# Patient Record
Sex: Female | Born: 1965
Health system: Southern US, Community
[De-identification: ages and names within clinical notes are randomized; demographics above are authoritative.]

## PROBLEM LIST (undated history)

## (undated) DIAGNOSIS — T7840XA Allergy, unspecified, initial encounter: Secondary | ICD-10-CM

## (undated) DIAGNOSIS — E782 Mixed hyperlipidemia: Secondary | ICD-10-CM

## (undated) DIAGNOSIS — E039 Hypothyroidism, unspecified: Secondary | ICD-10-CM

## (undated) DIAGNOSIS — F32A Depression, unspecified: Secondary | ICD-10-CM

## (undated) DIAGNOSIS — D649 Anemia, unspecified: Secondary | ICD-10-CM

## (undated) DIAGNOSIS — J45909 Unspecified asthma, uncomplicated: Secondary | ICD-10-CM

## (undated) DIAGNOSIS — E559 Vitamin D deficiency, unspecified: Secondary | ICD-10-CM

## (undated) DIAGNOSIS — F329 Major depressive disorder, single episode, unspecified: Secondary | ICD-10-CM

## (undated) HISTORY — DX: Vitamin D deficiency, unspecified: E55.9

## (undated) HISTORY — DX: Hypothyroidism, unspecified: E03.9

## (undated) HISTORY — DX: Allergy, unspecified, initial encounter: T78.40XA

## (undated) HISTORY — DX: Mixed hyperlipidemia: E78.2

## (undated) HISTORY — DX: Major depressive disorder, single episode, unspecified: F32.9

## (undated) HISTORY — DX: Anemia, unspecified: D64.9

## (undated) HISTORY — DX: Depression, unspecified: F32.A

## (undated) HISTORY — DX: Unspecified asthma, uncomplicated: J45.909

## (undated) HISTORY — PX: CERVICAL POLYPECTOMY: SHX88

---

## 2011-04-09 ENCOUNTER — Ambulatory Visit: Payer: 59

## 2011-04-10 ENCOUNTER — Ambulatory Visit (INDEPENDENT_AMBULATORY_CARE_PROVIDER_SITE_OTHER): Payer: 59 | Admitting: Emergency Medicine

## 2011-04-10 ENCOUNTER — Ambulatory Visit: Payer: 59

## 2011-04-10 VITALS — BP 115/77 | HR 79 | Temp 98.1°F | Resp 16 | Ht 65.75 in | Wt 140.4 lb

## 2011-04-10 DIAGNOSIS — J9801 Acute bronchospasm: Secondary | ICD-10-CM

## 2011-04-10 DIAGNOSIS — E039 Hypothyroidism, unspecified: Secondary | ICD-10-CM

## 2011-04-10 DIAGNOSIS — J40 Bronchitis, not specified as acute or chronic: Secondary | ICD-10-CM

## 2011-04-10 DIAGNOSIS — R059 Cough, unspecified: Secondary | ICD-10-CM

## 2011-04-10 DIAGNOSIS — R05 Cough: Secondary | ICD-10-CM

## 2011-04-10 LAB — POCT CBC
Granulocyte percent: 71.8 %G (ref 37–80)
MID (cbc): 0.6 (ref 0–0.9)
MPV: 9 fL (ref 0–99.8)
POC Granulocyte: 6.9 (ref 2–6.9)
POC LYMPH PERCENT: 21.9 %L (ref 10–50)
POC MID %: 6.3 %M (ref 0–12)
Platelet Count, POC: 302 10*3/uL (ref 142–424)
RBC: 4.4 M/uL (ref 4.04–5.48)
RDW, POC: 13.4 %
WBC: 9.6 10*3/uL (ref 4.6–10.2)

## 2011-04-10 MED ORDER — ALBUTEROL SULFATE HFA 108 (90 BASE) MCG/ACT IN AERS: 2.0000 | INHALATION_SPRAY | RESPIRATORY_TRACT | Status: DC | PRN

## 2011-04-10 MED ORDER — ALBUTEROL SULFATE (2.5 MG/3ML) 0.083% IN NEBU
2.5000 mg | INHALATION_SOLUTION | Freq: Once | RESPIRATORY_TRACT | Status: AC
  Administered 2011-04-10: 2.5 mg via RESPIRATORY_TRACT

## 2011-04-10 MED ORDER — AZITHROMYCIN 250 MG PO TABS: ORAL_TABLET | ORAL | Status: AC

## 2011-04-10 MED ORDER — HYDROCODONE-HOMATROPINE 5-1.5 MG/5ML PO SYRP: ORAL_SOLUTION | ORAL | Status: AC

## 2011-04-10 NOTE — Progress Notes (Signed)
Patient ID: Ed Rayson MRN: [id number], DOB: [birth year], 46 y.o. Date of Encounter: 04/10/2011, 1:54 PM  Primary Physician: No primary provider on file.  Chief Complaint:  Chief Complaint  Patient presents with  . Cough  . Nasal Congestion    HPI: 46 y.o. year old female presents with 7-8 day history of worsening nasal congestion, and cough. Afebrile. No chills. Nasal congestion is thick and clear. Cough is nonproductive, and not associated with time of day. She feels like her nasal congestion worsens at night. She will get into lingering coughing episodes. One of these episodes did cause her to get choked and vomit once. No SOB or wheezing. She does complain of mild sinus pressure coupled with muffled hearing. "It feels like I'm in a balloon." No sore throat or GI complaints. Normal appetite.   No leg trauma, sedentary periods, h/o cancer, or tobacco use.  Past Medical History  Diagnosis Date  . Hypothyroid      Home Meds: Prior to Admission medications   Medication Sig Start Date End Date Taking? Authorizing Provider  levothyroxine (SYNTHROID, LEVOTHROID) 88 MCG tablet Take 88 mcg by mouth daily.   Yes Historical Provider, MD    Allergies:  Allergies  Allergen Reactions  . Cortisone Rash    History   Social History  . Marital Status: Married    Spouse Name: N/A    Number of Children: N/A  . Years of Education: N/A   Occupational History  . Not on file.   Social History Main Topics  . Smoking status: Never Smoker   . Smokeless tobacco: Not on file  . Alcohol Use: Not on file  . Drug Use: Not on file  . Sexually Active: Not on file   Other Topics Concern  . Not on file   Social History Narrative  . No narrative on file     Review of Systems: Constitutional: negative for chills, fever, night sweats or weight changes Cardiovascular: negative for chest pain or palpitations Respiratory: negative for hemoptysis, wheezing, or shortness of  breath Abdominal: negative for abdominal pain, nausea, vomiting or diarrhea Dermatological: negative for rash Neurologic: negative for headache   Physical Exam: Blood pressure 115/77, pulse 79, temperature 98.1 F (36.7 C), temperature source Oral, resp. rate 16, height 5' 5.75" (1.67 m), weight 140 lb 6.4 oz (63.685 kg), last menstrual period 04/03/2011., Body mass index is 22.83 kg/(m^2). General: Well developed, well nourished, in no acute distress. Head: Normocephalic, atraumatic, eyes without discharge, sclera non-icteric, nares are congested. Bilateral auditory canals clear, TM's are without perforation, pearly grey with reflective cone of light bilaterally. Serous effusion bilaterally behind TM's.  No sinus TTP. Oral cavity moist, dentition normal. Posterior pharynx with post nasal drip and mild erythema. No peritonsillar abscess or tonsillar exudate. Neck: Supple. No thyromegaly. Full ROM. No lymphadenopathy. Lungs: Coarse breath sounds with mild expiratory wheezes. No rales or rhonchi. Breathing is unlabored. Heart: RRR with S1 S2. No murmurs, rubs, or gallops appreciated. Abdomen: Soft, non-tender, non-distended with normoactive bowel sounds. No hepatomegaly. No rebound/guarding. No obvious abdominal masses. Msk:  Strength and tone normal for age. Extremities: No clubbing or cyanosis. No edema. Neuro: Alert and oriented X 3. Moves all extremities spontaneously. CNII-XII grossly in tact. Psych:  Responds to questions appropriately with a normal affect.   S/P Albuterol neb: Patient feels much better. No further wheezing on auscultation. Lungs CTAB.  Labs: Results for orders placed in visit on 04/10/11  POCT CBC  Component Value Range   WBC 9.6  4.6 - 10.2 (K/uL)   Lymph, poc 2.1  0.6 - 3.4    POC LYMPH PERCENT 21.9  10 - 50 (%L)   MID (cbc) 0.6  0 - 0.9    POC MID % 6.3  0 - 12 (%M)   POC Granulocyte 6.9  2 - 6.9    Granulocyte percent 71.8  37 - 80 (%G)   RBC 4.40  4.04  - 5.48 (M/uL)   Hemoglobin 13.1  12.2 - 16.2 (g/dL)   HCT, POC 16.1  09.6 - 47.9 (%)   MCV 91.9  80 - 97 (fL)   MCH, POC 29.8  27 - 31.2 (pg)   MCHC 32.4  31.8 - 35.4 (g/dL)   RDW, POC 04.5     Platelet Count, POC 302  142 - 424 (K/uL)   MPV 9.0  0 - 99.8 (fL)   [facility] reading (PRIMARY) by  Dr. Cleta Alberts. No discrete infiltrate. Prominent interstitial markings.    ASSESSMENT AND PLAN:  46 y.o. year old female with bronchitis/bronchospasm/cough -Azithromycin 250 MG #6 2 po first day then 1 po next 4 days no RF -Hycodan #4oz 1 tsp po q 4-6 hours prn cough no RF SED -Proventil #1 2 puffs inhaled q 4-6 hours prn RF 2 -Mucinex -Tylenol/Motrin prn -Rest/fluids -RTC precautions -RTC 3-5 days if no improvement  Signed, Eula Listen, PA-C 04/10/2011 1:54 PM

## 2011-04-24 ENCOUNTER — Ambulatory Visit (INDEPENDENT_AMBULATORY_CARE_PROVIDER_SITE_OTHER): Payer: 59 | Admitting: Family Medicine

## 2011-04-24 VITALS — BP 114/66 | HR 73 | Temp 97.4°F | Resp 16 | Ht 65.0 in | Wt 140.0 lb

## 2011-04-24 DIAGNOSIS — J209 Acute bronchitis, unspecified: Secondary | ICD-10-CM

## 2011-04-24 DIAGNOSIS — J40 Bronchitis, not specified as acute or chronic: Secondary | ICD-10-CM

## 2011-04-24 MED ORDER — METHYLPREDNISOLONE 4 MG PO TABS: 4.0000 mg | ORAL_TABLET | Freq: Two times a day (BID) | ORAL | Status: AC

## 2011-04-24 MED ORDER — AZITHROMYCIN 250 MG PO TABS: ORAL_TABLET | ORAL | Status: DC

## 2011-04-24 NOTE — Patient Instructions (Signed)

## 2011-04-24 NOTE — Progress Notes (Signed)
46 yo woman with severe cough in spasms x 3 weeks. No fever.  Irritated throat.  Minimally productive  O:  HEENT: normal Chest:  Clear  A: pertussis  P:  z pak and medrol

## 2011-04-26 ENCOUNTER — Other Ambulatory Visit: Payer: Self-pay | Admitting: Family Medicine

## 2011-04-26 DIAGNOSIS — R05 Cough: Secondary | ICD-10-CM

## 2011-04-26 DIAGNOSIS — R059 Cough, unspecified: Secondary | ICD-10-CM

## 2011-04-26 MED ORDER — AMOXICILLIN 875 MG PO TABS: 875.0000 mg | ORAL_TABLET | Freq: Two times a day (BID) | ORAL | Status: DC

## 2011-04-28 NOTE — Progress Notes (Signed)
LMOM ADVISING PT THAT RX WAS SENT IN

## 2011-06-24 ENCOUNTER — Other Ambulatory Visit: Payer: Self-pay | Admitting: Family Medicine

## 2011-06-28 ENCOUNTER — Other Ambulatory Visit: Payer: Self-pay | Admitting: Family Medicine

## 2011-06-28 ENCOUNTER — Telehealth: Payer: Self-pay

## 2011-06-28 DIAGNOSIS — N644 Mastodynia: Secondary | ICD-10-CM

## 2011-06-28 DIAGNOSIS — E039 Hypothyroidism, unspecified: Secondary | ICD-10-CM

## 2011-06-28 MED ORDER — LEVOTHYROXINE SODIUM 88 MCG PO TABS: 88.0000 ug | ORAL_TABLET | Freq: Every day | ORAL | Status: DC

## 2011-06-28 NOTE — Telephone Encounter (Signed)
Ok to fill synthroid for one month, but needs an OV for more.

## 2011-06-28 NOTE — Telephone Encounter (Signed)
Left message to call back  

## 2011-06-28 NOTE — Telephone Encounter (Signed)
Pt states pharmacy sent two requests for levothyroxine (SYNTHROID, LEVOTHROID) 88 MCG tablet  medications and we have not responded.

## 2011-06-28 NOTE — Telephone Encounter (Signed)
Please pull paper chart.  

## 2011-06-29 NOTE — Telephone Encounter (Signed)
Spoke with husband, advised to have pt to RTC but refill sent in yesterday. Pt husband understood

## 2011-07-03 ENCOUNTER — Other Ambulatory Visit: Payer: Self-pay | Admitting: Family Medicine

## 2011-07-03 DIAGNOSIS — N63 Unspecified lump in unspecified breast: Secondary | ICD-10-CM

## 2011-07-05 ENCOUNTER — Ambulatory Visit (INDEPENDENT_AMBULATORY_CARE_PROVIDER_SITE_OTHER): Payer: 59 | Admitting: Family Medicine

## 2011-07-05 ENCOUNTER — Encounter: Payer: Self-pay | Admitting: Family Medicine

## 2011-07-05 DIAGNOSIS — R5381 Other malaise: Secondary | ICD-10-CM

## 2011-07-05 DIAGNOSIS — R5383 Other fatigue: Secondary | ICD-10-CM

## 2011-07-05 DIAGNOSIS — E039 Hypothyroidism, unspecified: Secondary | ICD-10-CM

## 2011-07-05 DIAGNOSIS — M940 Chondrocostal junction syndrome [Tietze]: Secondary | ICD-10-CM

## 2011-07-05 LAB — COMPREHENSIVE METABOLIC PANEL
ALT: 10 U/L (ref 0–35)
AST: 16 U/L (ref 0–37)
Albumin: 4.5 g/dL (ref 3.5–5.2)
Alkaline Phosphatase: 69 U/L (ref 39–117)
BUN: 18 mg/dL (ref 6–23)
CO2: 28 mEq/L (ref 19–32)
Calcium: 9.4 mg/dL (ref 8.4–10.5)
Chloride: 107 mEq/L (ref 96–112)
Creat: 1.11 mg/dL — ABNORMAL HIGH (ref 0.50–1.10)
Glucose, Bld: 70 mg/dL (ref 70–99)
Potassium: 4.4 mEq/L (ref 3.5–5.3)
Sodium: 142 mEq/L (ref 135–145)
Total Bilirubin: 0.4 mg/dL (ref 0.3–1.2)
Total Protein: 6.9 g/dL (ref 6.0–8.3)

## 2011-07-05 LAB — CBC
HCT: 38.7 % (ref 36.0–46.0)
Hemoglobin: 13.3 g/dL (ref 12.0–15.0)
MCH: 30.7 pg (ref 26.0–34.0)
MCHC: 34.4 g/dL (ref 30.0–36.0)
MCV: 89.4 fL (ref 78.0–100.0)
Platelets: 299 10*3/uL (ref 150–400)
RBC: 4.33 MIL/uL (ref 3.87–5.11)
RDW: 14.7 % (ref 11.5–15.5)
WBC: 5.4 10*3/uL (ref 4.0–10.5)

## 2011-07-05 LAB — TSH: TSH: 1.196 u[IU]/mL (ref 0.350–4.500)

## 2011-07-05 NOTE — Progress Notes (Signed)
[name] is a 46 year old woman from [place], [place] who comes in with 2 complaints: Fatigue and left chest pain.  The fatigue is been going on for several months, and patient has been on the same dose of thyroid medicine for some time. She needs this checked. She's having headaches fairly regularly as well.  Patient has a family history of breast cancer and has had a biopsy on the left breast. She has not had a mammogram in over 2 years. The pain that she is experiencing left chest is at the border of the breast and she's tried removing this days from her brassiere but does not lessened the pain.  [name] is married to a pianist. Stacey Gardner had a stroke several years ago and is required full-time attention from his wife since. 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 [place] is not drive, and Stacey Gardner can be a handful with his frontal lobe release signs.  I asked if maybe [name] was depressed) started crying and is Stacey Gardner said she was strong but that maybe she was depressed. Stacey Gardner has always been opposed to any kind of psychological help.  Objective: Palpation around the chest is negative, lungs are clear, heart is regular without murmur  Skin is unremarkable  Neck is supple without thyromegaly or adenopathy  Assessment: Active fairly certain that [name] is depressed. Who wouldn't be?  At the same time, she needs to have the chest pain thoroughly evaluated, and we need to make sure that her thyroid levels are normal.  Plan: Check metabolic profile, CBC, TSH. Also if requested a mammogram be done.  If all of these revealed no underlying cause of fatigue or chest pain, I would recommend an SSRI medicine. [facility] refill her thyroid medicine for year once we're sure that the TSH is in line.

## 2011-07-06 ENCOUNTER — Encounter: Payer: Self-pay | Admitting: *Deleted

## 2011-07-24 ENCOUNTER — Other Ambulatory Visit: Payer: Self-pay | Admitting: Family Medicine

## 2011-07-24 DIAGNOSIS — F32A Depression, unspecified: Secondary | ICD-10-CM

## 2011-07-24 DIAGNOSIS — F329 Major depressive disorder, single episode, unspecified: Secondary | ICD-10-CM

## 2011-07-24 MED ORDER — FLUOXETINE HCL 20 MG PO TABS: 20.0000 mg | ORAL_TABLET | Freq: Every day | ORAL | Status: DC

## 2011-08-28 ENCOUNTER — Other Ambulatory Visit: Payer: Self-pay | Admitting: Family Medicine

## 2011-08-31 ENCOUNTER — Other Ambulatory Visit: Payer: Self-pay | Admitting: Physician Assistant

## 2011-09-01 ENCOUNTER — Other Ambulatory Visit: Payer: Self-pay | Admitting: Family Medicine

## 2011-09-22 ENCOUNTER — Other Ambulatory Visit: Payer: Self-pay | Admitting: Family Medicine

## 2011-09-22 DIAGNOSIS — F329 Major depressive disorder, single episode, unspecified: Secondary | ICD-10-CM

## 2011-09-22 DIAGNOSIS — F32A Depression, unspecified: Secondary | ICD-10-CM

## 2011-09-22 MED ORDER — BUPROPION HCL ER (XL) 150 MG PO TB24: 150.0000 mg | ORAL_TABLET | Freq: Every day | ORAL | Status: DC

## 2011-10-10 ENCOUNTER — Other Ambulatory Visit: Payer: Self-pay | Admitting: Family Medicine

## 2011-11-28 ENCOUNTER — Other Ambulatory Visit: Payer: Self-pay | Admitting: Family Medicine

## 2011-11-28 DIAGNOSIS — E039 Hypothyroidism, unspecified: Secondary | ICD-10-CM

## 2011-11-28 DIAGNOSIS — F329 Major depressive disorder, single episode, unspecified: Secondary | ICD-10-CM

## 2011-11-28 DIAGNOSIS — F32A Depression, unspecified: Secondary | ICD-10-CM

## 2011-11-28 MED ORDER — BUPROPION HCL ER (XL) 150 MG PO TB24: 150.0000 mg | ORAL_TABLET | Freq: Every day | ORAL | Status: DC

## 2011-11-28 MED ORDER — LEVOTHYROXINE SODIUM 88 MCG PO TABS: 88.0000 ug | ORAL_TABLET | Freq: Every day | ORAL | Status: DC

## 2012-04-08 DIAGNOSIS — R87610 Atypical squamous cells of undetermined significance on cytologic smear of cervix (ASC-US): Secondary | ICD-10-CM | POA: Insufficient documentation

## 2012-08-27 ENCOUNTER — Encounter: Payer: 59 | Admitting: Family Medicine

## 2012-09-10 ENCOUNTER — Encounter: Payer: Self-pay | Admitting: Family Medicine

## 2012-09-10 ENCOUNTER — Ambulatory Visit (INDEPENDENT_AMBULATORY_CARE_PROVIDER_SITE_OTHER): Payer: BC Managed Care – PPO | Admitting: Family Medicine

## 2012-09-10 VITALS — BP 104/62 | HR 61 | Temp 97.8°F | Resp 16 | Ht 65.75 in | Wt 140.8 lb

## 2012-09-10 DIAGNOSIS — Z124 Encounter for screening for malignant neoplasm of cervix: Secondary | ICD-10-CM

## 2012-09-10 DIAGNOSIS — Z0000 Encounter for general adult medical examination without abnormal findings: Secondary | ICD-10-CM

## 2012-09-10 LAB — POCT UA - MICROSCOPIC ONLY
Casts, Ur, LPF, POC: NEGATIVE
Crystals, Ur, HPF, POC: NEGATIVE
Mucus, UA: NEGATIVE
Yeast, UA: NEGATIVE

## 2012-09-10 LAB — CBC
HCT: 41.1 % (ref 36.0–46.0)
Hemoglobin: 14 g/dL (ref 12.0–15.0)
MCH: 30.6 pg (ref 26.0–34.0)
MCHC: 34.1 g/dL (ref 30.0–36.0)
MCV: 89.9 fL (ref 78.0–100.0)
Platelets: 292 10*3/uL (ref 150–400)
RBC: 4.57 MIL/uL (ref 3.87–5.11)
RDW: 14 % (ref 11.5–15.5)
WBC: 6.2 10*3/uL (ref 4.0–10.5)

## 2012-09-10 LAB — POCT URINALYSIS DIPSTICK
Bilirubin, UA: NEGATIVE
Glucose, UA: NEGATIVE
Ketones, UA: NEGATIVE
Nitrite, UA: NEGATIVE
Protein, UA: NEGATIVE
Spec Grav, UA: 1.01
Urobilinogen, UA: 0.2
pH, UA: 5

## 2012-09-10 NOTE — Progress Notes (Signed)
  Subjective:    Patient ID: Stacey Gardner, female    DOB: [birth year], 47 y.o.   MRN: [id number]  HPI    Review of Systems  Constitutional: Negative.   HENT: Negative.   Eyes: Negative.   Respiratory: Negative.   Cardiovascular: Negative.   Gastrointestinal: Positive for constipation.  Endocrine: Negative.   Genitourinary: Negative.   Musculoskeletal: Negative.   Skin: Negative.   Allergic/Immunologic: Negative.   Neurological: Negative.   Hematological: Negative.   Psychiatric/Behavioral: Negative.        Objective:   Physical Exam        Assessment & Plan:

## 2012-09-10 NOTE — Progress Notes (Signed)
Patient ID: Stacey Gardner MRN: [id number], DOB: 09-Aug-1965, 47 y.o. Date of Encounter: 09/10/2012, 11:33 AM  Primary Physician: No primary provider on file.  Chief Complaint: Physical (CPE)  HPI: 47 y.o. y/o female with history of noted below here for CPE.  Doing well. Notes constipation, cold feet, muscle cramps and bone aches in hips along with sleepiness Last dt 7 years ago LMP:  08-30-2012 Pap:  Over 10 years ago. MMG:  2013 Review of Systems: Consitutional: No fever, chills, fatigue, night sweats, lymphadenopathy, or weight changes. Eyes: No visual changes, eye redness, or discharge. ENT/Mouth: Ears: No otalgia, tinnitus, hearing loss, discharge. Nose: No congestion, rhinorrhea, sinus pain, or epistaxis. Throat: No sore throat, post nasal drip, or teeth pain. Cardiovascular: No CP, palpitations, diaphoresis, DOE, edema, orthopnea, PND. Respiratory: No cough, hemoptysis, SOB, or wheezing. Gastrointestinal: No anorexia, dysphagia, reflux, pain, nausea, vomiting, hematemesis, diarrhea, BRBPR, or melena. Breast: No discharge, pain, swelling, or mass. Genitourinary: No dysuria, frequency, urgency, hematuria, incontinence, nocturia, amenorrhea, vaginal discharge, pruritis, burning, abnormal bleeding, or pain. Musculoskeletal: No decreased ROM, stiffness, joint swelling, or weakness. Skin: No rash, erythema, lesion changes, pain, warmth, jaundice, or pruritis. Neurological: No headache, dizziness, syncope, seizures, tremors, memory loss, coordination problems, or paresthesias. Psychological: No anxiety, depression, hallucinations, SI/HI. Endocrine: No fatigue, polydipsia, polyphagia, polyuria, or known diabetes. All other systems were reviewed and are otherwise negative.  Past Medical History  Diagnosis Date  . Hypothyroid   . Allergy   . Depression   . Anemia      History reviewed. No pertinent past surgical history.  Home Meds:  Prior to Admission medications   Medication  Sig Start Date End Date Taking? Authorizing Provider  levothyroxine (SYNTHROID, LEVOTHROID) 88 MCG tablet Take 1 tablet (88 mcg total) by mouth daily. 11/28/11  Yes Elvina Sidle, MD  minocycline (MINOCIN,DYNACIN) 100 MG capsule Take 100 mg by mouth 2 (two) times daily.   Yes Historical Provider, MD  albuterol (PROVENTIL HFA;VENTOLIN HFA) 108 (90 BASE) MCG/ACT inhaler Inhale 2 puffs into the lungs every 4 (four) hours as needed for wheezing. 04/10/11 04/09/12  Raymon Mutton Dunn, PA-C  buPROPion (WELLBUTRIN XL) 150 MG 24 hr tablet Take 1 tablet (150 mg total) by mouth daily. 11/28/11 11/27/12  Elvina Sidle, MD    Allergies:  Allergies  Allergen Reactions  . Codeine Nausea Only  . Cortisone Rash  . Neosporin (Neomycin-Polymyxin-Gramicidin) Rash    History   Social History  . Marital Status: Married    Spouse Name: N/A    Number of Children: N/A  . Years of Education: N/A   Occupational History  . Not on file.   Social History Main Topics  . Smoking status: Never Smoker   . Smokeless tobacco: Not on file  . Alcohol Use: No  . Drug Use: No  . Sexually Active: Not on file   Other Topics Concern  . Not on file   Social History Narrative   Married. Education: Other    Family History  Problem Relation Age of Onset  . Cancer Mother     breast  . Heart disease Father     Physical Exam: Blood pressure 104/62, pulse 61, temperature 97.8 F (36.6 C), temperature source Oral, resp. rate 16, height 5' 5.75" (1.67 m), weight 140 lb 12.8 oz (63.866 kg), last menstrual period 08/30/2012, SpO2 99.00%., Body mass index is 22.9 kg/(m^2). General: Well developed, well nourished, in no acute distress. HEENT: Normocephalic, atraumatic. Conjunctiva pink, sclera non-icteric. Pupils 2 mm constricting  to 1 mm, round, regular, and equally reactive to light and accomodation. EOMI. Internal auditory canal clear. TMs with good cone of light and without pathology. Nasal mucosa pink. Nares are without  discharge. No sinus tenderness. Oral mucosa pink. Dentition excellent. Pharynx without exudate.   Neck: Supple. Trachea midline. No thyromegaly. Full ROM. No lymphadenopathy. Lungs: Clear to auscultation bilaterally without wheezes, rales, or rhonchi. Breathing is of normal effort and unlabored. Cardiovascular: RRR with S1 S2. No murmurs, rubs, or gallops appreciated. Distal pulses 2+ symmetrically. No carotid or abdominal bruits. Breast: Symmetrical. No masses. Nipples without discharge. Abdomen: Soft, non-tender, non-distended with normoactive bowel sounds. No hepatosplenomegaly or masses. No rebound/guarding. No CVA tenderness. Without hernias.  Genitourinary:  External genitalia without lesions. Vaginal mucosa pink. Cervix pink and without discharge. No cervical or adnexal tenderness. Pap smear taken Musculoskeletal: Full range of motion and 5/5 strength throughout. Without swelling, atrophy, tenderness, crepitus, or warmth. Extremities without clubbing, cyanosis, or edema. Calves supple. Skin: Warm and moist without erythema, ecchymosis, wounds, or rash. Neuro: A+Ox3. CN II-XII grossly intact. Moves all extremities spontaneously. Full sensation throughout. Normal gait. DTR 2+ throughout upper and lower extremities. Finger to nose intact. Psych:  Responds to questions appropriately with a normal affect.   Studies: CBC, CMET, Lipid, TSH pending  Results for orders placed in visit on 09/10/12  POCT UA - MICROSCOPIC ONLY      Result Value Range   WBC, Ur, HPF, POC 0-5     RBC, urine, microscopic 0-6     Bacteria, U Microscopic trace     Mucus, UA neg     Epithelial cells, urine per micros 3-10     Crystals, Ur, HPF, POC neg     Casts, Ur, LPF, POC neg     Yeast, UA neg    POCT URINALYSIS DIPSTICK      Result Value Range   Color, UA yellow     Clarity, UA clear     Glucose, UA neg     Bilirubin, UA neg     Ketones, UA neg     Spec Grav, UA 1.010     Blood, UA trace-intact     pH, UA  5.0     Protein, UA neg     Urobilinogen, UA 0.2     Nitrite, UA neg     Leukocytes, UA Trace       Assessment/Plan:  47 y.o. y/o female here for CPE.  Her symptoms today suggest that she has hypothyroidism Annual physical exam - Plan: CBC, Comprehensive metabolic panel, TSH, POCT UA - Microscopic Only, POCT urinalysis dipstick, Lipid panel, Ambulatory referral to Gastroenterology  Pap smear for cervical cancer screening - Plan: Pap IG (Image Guided)  Signed, Elvina Sidle, MD 09/10/2012 11:33 AM

## 2012-09-11 LAB — COMPREHENSIVE METABOLIC PANEL
ALT: 13 U/L (ref 0–35)
AST: 18 U/L (ref 0–37)
Albumin: 4.7 g/dL (ref 3.5–5.2)
Alkaline Phosphatase: 79 U/L (ref 39–117)
BUN: 13 mg/dL (ref 6–23)
CO2: 28 mEq/L (ref 19–32)
Calcium: 9.7 mg/dL (ref 8.4–10.5)
Chloride: 104 mEq/L (ref 96–112)
Creat: 0.94 mg/dL (ref 0.50–1.10)
Glucose, Bld: 78 mg/dL (ref 70–99)
Potassium: 4.3 mEq/L (ref 3.5–5.3)
Sodium: 139 mEq/L (ref 135–145)
Total Bilirubin: 0.6 mg/dL (ref 0.3–1.2)
Total Protein: 7.3 g/dL (ref 6.0–8.3)

## 2012-09-11 LAB — LIPID PANEL
Cholesterol: 216 mg/dL — ABNORMAL HIGH (ref 0–200)
HDL: 51 mg/dL (ref >39)
LDL Cholesterol: 138 mg/dL — ABNORMAL HIGH (ref 0–99)
Total CHOL/HDL Ratio: 4.2 Ratio
Triglycerides: 137 mg/dL (ref <150)
VLDL: 27 mg/dL (ref 0–40)

## 2012-09-11 LAB — TSH: TSH: 1.62 u[IU]/mL (ref 0.350–4.500)

## 2012-09-14 LAB — PAP IG (IMAGE GUIDED)

## 2012-09-15 ENCOUNTER — Other Ambulatory Visit: Payer: Self-pay | Admitting: Family Medicine

## 2012-09-15 DIAGNOSIS — IMO0002 Reserved for concepts with insufficient information to code with codable children: Secondary | ICD-10-CM

## 2012-10-16 ENCOUNTER — Other Ambulatory Visit: Payer: Self-pay | Admitting: Family Medicine

## 2012-10-16 DIAGNOSIS — R059 Cough, unspecified: Secondary | ICD-10-CM

## 2012-10-16 DIAGNOSIS — R05 Cough: Secondary | ICD-10-CM

## 2012-10-16 MED ORDER — AZITHROMYCIN 250 MG PO TABS: ORAL_TABLET | ORAL | Status: AC

## 2012-11-08 ENCOUNTER — Other Ambulatory Visit: Payer: Self-pay | Admitting: Family Medicine

## 2012-11-08 DIAGNOSIS — R059 Cough, unspecified: Secondary | ICD-10-CM

## 2012-11-08 DIAGNOSIS — J9801 Acute bronchospasm: Secondary | ICD-10-CM

## 2012-11-08 DIAGNOSIS — R05 Cough: Secondary | ICD-10-CM

## 2012-11-08 MED ORDER — ALBUTEROL SULFATE HFA 108 (90 BASE) MCG/ACT IN AERS: 2.0000 | INHALATION_SPRAY | RESPIRATORY_TRACT | Status: DC | PRN

## 2012-11-08 MED ORDER — AMOXICILLIN 875 MG PO TABS: 875.0000 mg | ORAL_TABLET | Freq: Two times a day (BID) | ORAL | Status: AC

## 2012-11-18 LAB — HM COLONOSCOPY

## 2013-05-22 ENCOUNTER — Other Ambulatory Visit: Payer: Self-pay | Admitting: Family Medicine

## 2013-06-22 ENCOUNTER — Other Ambulatory Visit: Payer: Self-pay | Admitting: Family Medicine

## 2013-06-22 DIAGNOSIS — J329 Chronic sinusitis, unspecified: Secondary | ICD-10-CM

## 2013-06-22 MED ORDER — AMOXICILLIN-POT CLAVULANATE 875-125 MG PO TABS: 1.0000 | ORAL_TABLET | Freq: Two times a day (BID) | ORAL | Status: DC

## 2013-06-23 ENCOUNTER — Encounter: Payer: Self-pay | Admitting: Family Medicine

## 2013-06-23 ENCOUNTER — Ambulatory Visit (INDEPENDENT_AMBULATORY_CARE_PROVIDER_SITE_OTHER): Payer: BC Managed Care – PPO | Admitting: Family Medicine

## 2013-06-23 VITALS — BP 140/72 | HR 66 | Temp 97.8°F | Resp 16 | Ht 65.25 in | Wt 143.0 lb

## 2013-06-23 DIAGNOSIS — E039 Hypothyroidism, unspecified: Secondary | ICD-10-CM

## 2013-06-23 DIAGNOSIS — J0100 Acute maxillary sinusitis, unspecified: Secondary | ICD-10-CM

## 2013-06-23 DIAGNOSIS — J309 Allergic rhinitis, unspecified: Secondary | ICD-10-CM

## 2013-06-23 LAB — THYROID PANEL WITH TSH
FREE THYROXINE INDEX: 3.3 (ref 1.0–3.9)
T3 UPTAKE: 33.5 % (ref 22.5–37.0)
T4 TOTAL: 9.9 ug/dL (ref 5.0–12.5)
TSH: 1.382 u[IU]/mL (ref 0.350–4.500)

## 2013-06-23 MED ORDER — CEFUROXIME AXETIL 500 MG PO TABS: 500.0000 mg | ORAL_TABLET | Freq: Two times a day (BID) | ORAL | Status: DC

## 2013-06-23 MED ORDER — DESLORATADINE 5 MG PO TBDP: 5.0000 mg | ORAL_TABLET | Freq: Every day | ORAL | Status: DC

## 2013-06-23 NOTE — Patient Instructions (Signed)
Rinitis alrgica (Allergic Rhinitis) La rinitis alrgica ocurre cuando las membranas mucosas de la nariz responden a los alrgenos. Los alrgenos son las partculas que estn en el aire y que hacen que el cuerpo tenga una reaccin Counselling psychologistalrgica. Esto hace que usted libere anticuerpos alrgicos. A travs de una cadena de eventos, estos finalmente hacen que usted libere histamina en la corriente sangunea. Aunque la funcin de la histamina es proteger al organismo, es esta liberacin de histamina lo que provoca malestar, como los estornudos frecuentes, la congestin y goteo y Control and instrumentation engineerpicazn nasales.  CAUSAS  La causa de la rinitis Merchandiser, retailalrgica estacional (fiebre del heno) son los alrgenos del polen que pueden provenir del csped, los rboles y Theme park managerla maleza. La causa de la rinitis IT consultantalrgica permanente (rinitis alrgica perenne) son los alrgenos como los caros del polvo domstico, la caspa de las mascotas y las esporas del moho.  SNTOMAS   Secrecin nasal (congestin).  Goteo y picazn nasales con estornudos y Arboriculturistlagrimeo. DIAGNSTICO  Su mdico puede ayudarlo a Warehouse managerdeterminar el alrgeno o los alrgenos que desencadenan sus sntomas. Si usted y su mdico no pueden Chief Strategy Officerdeterminar cul es el alrgeno, pueden hacerse anlisis de sangre o estudios de la piel. TRATAMIENTO  La rinitis alrgica no tiene Arubacura, pero puede controlarse mediante lo siguiente:  Medicamentos y vacunas contra la alergia (inmunoterapia).  Prevencin del alrgeno. La fiebre del heno a menudo puede tratarse con antihistamnicos en las formas de pldoras o aerosol nasal. Los antihistamnicos bloquean los efectos de la histamina. Existen medicamentos de venta libre que pueden ayudar con la congestin nasal y la hinchazn alrededor de los ojos. Consulte a su mdico antes de tomar o administrarse este medicamento.  Si la prevencin del alrgeno o el medicamento recetado no dan resultado, existen muchos medicamentos nuevos que su mdico puede recetarle. Pueden  usarse medicamentos ms fuertes si las medidas iniciales no son efectivas. Pueden aplicarse inyecciones desensibilizantes si los medicamentos y la prevencin no funcionan. La desensibilizacin ocurre cuando un paciente recibe vacunas constantes hasta que el cuerpo se vuelve menos sensible al alrgeno. Asegrese de Medical sales representativerealizar un seguimiento con su mdico si los problemas continan. INSTRUCCIONES PARA EL CUIDADO EN EL HOGAR No es posible evitar por completo los alrgenos, pero puede reducir los sntomas al tomar medidas para limitar su exposicin a ellos. Es muy til saber exactamente a qu es alrgico para que pueda evitar sus desencadenantes especficos. SOLICITE ATENCIN MDICA SI:   Lance Mussiene fiebre.  Desarrolla una tos que no se detiene fcilmente (persistente).  Le falta el aire.  Comienza a tener sibilancias.  Los sntomas interfieren con las actividades diarias normales. Document Released: 10/31/2004 Document Revised: 11/11/2012 [facility]Exit[facility] Patient Information 2014 [place]keleyExitCare, MarylandLLC.     Dficit de vitamina D  (Vitamin D Deficiency) La vitamina D es una vitamina importante necesaria para el organismo. Se denomina dficit cuando hay muy poca cantidad en el organismo. Un dficit muy grave puede hacer que los Dumashuesos se ablanden y puede causar una enfermedad llamada raquitismo.  La vitamina D es importante para el organismo por diferentes razones, por ejemplo:   Ayuda en la absorcin de dos minerales llamados calcio y fsforo.  Ayuda a que los McGraw-Hillhuesos estn sanos.  Puede prevenir algunas enfermedades, como la diabetes y la esclerosis mltiple.  Ayuda al funcionamiento de los msculos y Insurance underwriterel corazn. La vitamina D puede obtenerse de Dillard'sdiferentes maneras. Es Neomia Dearuna parte natural de algunos alimentos. La vitamina se agrega tambin a algunos productos lcteos y cereales. Algunas personas toman suplementos de  vitamina D. Adems, el organismo produce vitamina D cuando est al sol. Los rayos del sol hacen  que el organismo produzca una forma de la vitamina que puede Tigardutilizar.  CAUSAS   No comer suficientes alimentos que contengan vitamina D.  No recibir suficiente IT consultantluz solar.  Sufrir Theatre managerciertas enfermedades del sistema digestivo que hacen difcil la absorcin de vitamina D. Estas enfermedades incluyen la enfermedad de Crohn, pancreatitis crnica y la fibrosis Cayman Islandsqustica.  Leanor RubensteinHaber sido sometido a una ciruga en la que se elimina una parte del estmago o el intestino delgado.  Ser obeso. Las clulas de grasa eliminan la vitamina D de la Melmoresangre. Eso hace que las personas obesas no tengan suficiente vitamina D en la sangre y en los tejidos del organismo.  Enfermedades crnicas de los riones o del hgado. FACTORES DE RIESGO  Los factores de riesgo son los que hacen ms probable el desarrollo del dficit de vitamina D. Ellos son:   El envejecimiento.  No poder salir mucho al exterior.  Vivir en un hogar de ancianos.  Fracturas de huesos.  Debilidad de los huesos o huesos delgados (osteoporosis).  Sufrir una enfermedad o trastorno que modifica la forma en que el organismo absorbe la vitamina D.  Tener la piel oscura.  Algunos medicamentos como los medicamentos para las convulsiones o los corticoides.  Tener sobrepeso o ser obeso. SNTOMAS  En los casos leves puede no haber sntomas. En los casos ms graves, los sntomas pueden ser:   Kerr-McGeeDolor en los huesos.  Dolor muscular.  Caerse con frecuencia.  Huesos rotos por lesiones menores, debido a la osteoporosis. DIAGNSTICO  Un anlisis de sangre es la mejor manera de diagnosticar el dficit de vitamina D.  TRATAMIENTO  El dficit de vitamina D puede tratarse de Galesburgdiferentes maneras. El tratamiento depende de la causa. Las opciones incluyen:   Tomar suplementos de vitamina D.  Tomar un suplemento de calcio. Su mdico le sugerir qu dosis es la mejor para usted. INSTRUCCIONES PARA EL CUIDADO EN EL HOGAR   Tome los suplementos que su  mdico le recete. Siga cuidadosamente las indicaciones. Tome slo la cantidad sugerida.  Hgase un anlisis de Grangersangre 2 meses despus de Corporate investment bankerempezar a ConocoPhillipstomar los suplementos.  Consuma alimentos que contengan vitamina D. Algunas buenas opciones son:  Productos lcteos cereales o jugos fortificados, . Fortificado significa que se ha aadido vitamina D a la comida. Revise la etiqueta del paquete para estar seguro.  Pescados grasos como el salmn o la [place] Elm Villagetrucha.  Huevos  Ostras.  No  utilice la cama de solar.  Mantenga su peso en un nivel saludable. Baje de peso si es necesario.  Cumpla con las visitas de control tal como se le indic. El mdico necesitar indicar anlisis de sangre para asegurarse que el dficit de vitamina D est mejorando. SOLICITE ATENCIN MDICA SI:   Tiene preguntas relacionadas con el tratamiento  Contina con los sntomas de dficit de vitamina D  Tiene nuseas o vmitos.  Est constipado.  Se siente confundido.  Siente dolor abdominal o dolor de espalda intenso. ASEGRESE DE QUE:   Comprende estas instrucciones.  Controlar su enfermedad.  Solicitar ayuda de inmediato si no mejora o si empeora. Document Released: 04/15/2011 Document Revised: 05/18/2012 [facility] LPExitCare Patient Information 2014 [place]ExitCare, MarylandLLC.

## 2013-06-24 ENCOUNTER — Other Ambulatory Visit: Payer: Self-pay | Admitting: Family Medicine

## 2013-06-24 DIAGNOSIS — E039 Hypothyroidism, unspecified: Secondary | ICD-10-CM | POA: Insufficient documentation

## 2013-06-24 DIAGNOSIS — J309 Allergic rhinitis, unspecified: Secondary | ICD-10-CM | POA: Insufficient documentation

## 2013-06-24 HISTORY — DX: Allergic rhinitis, unspecified: J30.9

## 2013-06-24 LAB — VITAMIN D 25 HYDROXY (VIT D DEFICIENCY, FRACTURES): VIT D 25 HYDROXY: 16 ng/mL — AB (ref 30–89)

## 2013-06-24 MED ORDER — ERGOCALCIFEROL 1.25 MG (50000 UT) PO CAPS: 50000.0000 [IU] | ORAL_CAPSULE | ORAL | Status: DC

## 2013-06-24 NOTE — Progress Notes (Signed)
Quick Note:  Please advise pt regarding following labs... Thyroid results are normal; you are taking the right amount of medication. I will refill your thyroid medication for 6 months.  Your Vitamin D level is below normal. I will prescribe Vitamin D 1610950000 units per capsule; you need to take this medication ONCE A WEEK through the summer. Refer to the print information you were given. Try to get 10-15 minutes of sun exposure each day.  Copy to pt. ______

## 2013-06-24 NOTE — Progress Notes (Signed)
Subjective:    Patient ID: Stacey Gardner, Stacey Gardner    DOB: 04/15/1965, 48 y.o.   MRN: [id number]  HPI  This 48 y.o. [place] Stacey Gardner is here w/ her husband for evaluation of allergy symptoms w/ congestion, HA/sinus pressure, itchy throat and burning eyes. OTC Claritin has not been effective this year. She does not have fever/chills, sore throat, ear pain, epistaxis or trouble swallowing. The couple lives in a house built in the [address]60s; they do not have the HVAC maintenance routinely. They do not have pets.  Pt has hypothyroidism, last TSH= 1.620 checked in August 2014. She feels like current dose is appropriate. She has good appetite and energy level; she denies CP or palpitations, GI problems, skin or hair changes, HA, dizziness, weakness, confusion or sleep disturbances.  Patient Active Problem List   Diagnosis Date Noted  . Hypothyroid     PMHx, Surg Hx, Soc and Fam HX reviewed.   Review of Systems  Constitutional: Negative.   HENT: Positive for congestion, postnasal drip, rhinorrhea and sneezing.   Eyes: Positive for itching.  Respiratory: Negative.   Cardiovascular: Negative.   Gastrointestinal: Negative.   Endocrine: Negative.   Musculoskeletal: Negative.   Skin: Negative.   Allergic/Immunologic: Positive for environmental allergies.  Neurological: Negative.   Psychiatric/Behavioral: Negative for confusion, sleep disturbance, dysphoric mood, decreased concentration and agitation. The patient is not nervous/anxious.        Objective:   Physical Exam  Nursing note and vitals reviewed. Constitutional: She is oriented to person, place, and time. Vital signs are normal. She appears well-developed and well-nourished. No distress.  HENT:  Head: Normocephalic and atraumatic.  Right Ear: Hearing, tympanic membrane, external ear and ear canal normal.  Left Ear: Hearing, tympanic membrane, external ear and ear canal normal.  Nose: Mucosal edema present. No sinus tenderness, nasal  deformity or septal deviation. Right sinus exhibits maxillary sinus tenderness. Right sinus exhibits no frontal sinus tenderness. Left sinus exhibits maxillary sinus tenderness. Left sinus exhibits no frontal sinus tenderness.  Neck: Trachea normal, normal range of motion and full passive range of motion without pain. Neck supple. No mass and no thyromegaly present.  Cardiovascular: Normal rate, regular rhythm, S1 normal, S2 normal and normal heart sounds.   No extrasystoles are present. PMI is not displaced.  Exam reveals no gallop and no friction rub.   No murmur heard. Pulmonary/Chest: Effort normal and breath sounds normal. No respiratory distress.  Lymphadenopathy:       Head (right side): No submental, no submandibular, no tonsillar, no preauricular, no posterior auricular and no occipital adenopathy present.       Head (left side): No submental, no submandibular, no tonsillar, no preauricular, no posterior auricular and no occipital adenopathy present.    She has no cervical adenopathy.       Right: No supraclavicular adenopathy present.       Left: No supraclavicular adenopathy present.  Neurological: She is alert and oriented to person, place, and time. She has normal strength. No cranial nerve deficit or sensory deficit. Coordination and gait normal.  Reflex Scores:      Tricep reflexes are 2+ on the right side and 2+ on the left side.      Bicep reflexes are 2+ on the right side and 2+ on the left side.      Brachioradialis reflexes are 1+ on the right side and 1+ on the left side.      Patellar reflexes are 2+ on the right side  and 2+ on the left side. Skin: Skin is warm, dry and intact. No ecchymosis, no lesion and no rash noted. She is not diaphoretic. No cyanosis or erythema. No pallor. Nails show no clubbing.  Psychiatric: She has a normal mood and affect. Her behavior is normal. Judgment and thought content normal.      Assessment & Plan:  Sinusitis, acute maxillary- RX:  Cefuroxime 500 mg  1 tablet bid x 7 days.  Allergic rhinitis, cause unspecified- RX: Clarinex generic  5 mg 1 tab daily. Use OTC AYR saline nasal mist.  Unspecified hypothyroidism - Plan: Thyroid Panel With TSH, Vitamin D, 25-hydroxy  Meds ordered this encounter  Medications  . desloratadine (CLARINEX REDITAB) 5 MG disintegrating tablet    Sig: Take 1 tablet (5 mg total) by mouth daily.    Dispense:  30 tablet    Refill:  5  . cefUROXime (CEFTIN) 500 MG tablet    Sig: Take 1 tablet (500 mg total) by mouth 2 (two) times daily with a meal.    Dispense:  14 tablet    Refill:  0

## 2013-06-30 ENCOUNTER — Encounter: Payer: Self-pay | Admitting: Radiology

## 2013-07-04 ENCOUNTER — Other Ambulatory Visit: Payer: Self-pay | Admitting: Family Medicine

## 2013-07-04 NOTE — Telephone Encounter (Signed)
Patient called and did not receive her medication refill for Synthroid. It looks like her TSH was OK and you were going to refill the medication for 6 months.

## 2013-07-04 NOTE — Telephone Encounter (Signed)
Pt states she needs a medication refill on her synthroid. Please advise [phone]

## 2013-07-04 NOTE — Telephone Encounter (Signed)
I have refilled thyroid medication for 3 additional months. Pt will need OV in next 3-4 months.

## 2013-07-05 NOTE — Telephone Encounter (Signed)
LMOM RFs sent and pt needs f/up 3-4 mos.

## 2013-09-15 ENCOUNTER — Other Ambulatory Visit: Payer: Self-pay | Admitting: Family Medicine

## 2013-09-15 DIAGNOSIS — J329 Chronic sinusitis, unspecified: Secondary | ICD-10-CM

## 2013-09-15 MED ORDER — CEFUROXIME AXETIL 500 MG PO TABS: 500.0000 mg | ORAL_TABLET | Freq: Two times a day (BID) | ORAL | Status: DC

## 2013-10-05 ENCOUNTER — Other Ambulatory Visit: Payer: Self-pay | Admitting: Family Medicine

## 2013-10-05 DIAGNOSIS — E039 Hypothyroidism, unspecified: Secondary | ICD-10-CM

## 2013-10-05 MED ORDER — LEVOTHYROXINE SODIUM 88 MCG PO TABS: 88.0000 ug | ORAL_TABLET | Freq: Every day | ORAL | Status: DC

## 2013-12-27 ENCOUNTER — Other Ambulatory Visit: Payer: Self-pay | Admitting: Family Medicine

## 2013-12-27 DIAGNOSIS — J320 Chronic maxillary sinusitis: Secondary | ICD-10-CM

## 2013-12-27 MED ORDER — CEFUROXIME AXETIL 500 MG PO TABS: 500.0000 mg | ORAL_TABLET | Freq: Two times a day (BID) | ORAL | Status: DC

## 2014-01-11 ENCOUNTER — Ambulatory Visit (INDEPENDENT_AMBULATORY_CARE_PROVIDER_SITE_OTHER): Payer: BC Managed Care – PPO | Admitting: Emergency Medicine

## 2014-01-11 VITALS — BP 104/78 | HR 77 | Temp 98.1°F | Resp 12 | Ht 65.0 in | Wt 143.0 lb

## 2014-01-11 DIAGNOSIS — J0140 Acute pansinusitis, unspecified: Secondary | ICD-10-CM

## 2014-01-11 DIAGNOSIS — J209 Acute bronchitis, unspecified: Secondary | ICD-10-CM

## 2014-01-11 MED ORDER — LEVOFLOXACIN 500 MG PO TABS: 500.0000 mg | ORAL_TABLET | Freq: Every day | ORAL | Status: AC

## 2014-01-11 MED ORDER — HYDROCOD POLST-CHLORPHEN POLST 10-8 MG/5ML PO LQCR: 5.0000 mL | Freq: Two times a day (BID) | ORAL | Status: DC | PRN

## 2014-01-11 MED ORDER — PSEUDOEPHEDRINE-GUAIFENESIN ER 60-600 MG PO TB12: 1.0000 | ORAL_TABLET | Freq: Two times a day (BID) | ORAL | Status: DC

## 2014-01-11 NOTE — Patient Instructions (Signed)
Inhalador con medidor de dosis (sin Geographical information systems officer) (Metered Dose Inhaler [No Spacer Used]) Los medicamentos que se inhalan son la base del tratamiento del asma y otros problemas respiratorios. Los medicamentos inhalados son eficaces cuando se utilizan adecuadamente. Una buena tcnica asegura que el medicamento llegue a los pulmones. Los inhaladores con medidor de dosis se [place] para Architectural technologist varios tipos de medicamentos inhalados. Entre ellos se incluyen los medicamentos de alivio rpido o medicamentos de rescate (como los broncodilatadores) y los medicamentos de control (como los corticoides). El medicamento se administra presionando el contenedor metlico para Financial controller una cantidad de aerosol. Si [place] diferentes tipos de inhaladores, use el medicamento de alivio rpido para abrir las vas respiratorias 10 a 27mnutos antes de usar un corticoide, si el mdico se lo indic. Si no est seguro de qTeachers Insurance and Annuity Associationusar y en qu orden usarlos, consulte a su mdico, enfermera o terapeuta especialista en vas respiratorias. CMO USAR EL INHALADOR 1. Retire la tapa del inhalador. 2. Si uCanadael inhalador por primera vez, ser necesario que lo prepare. Sacuda el inhalador durante 5 segundos y libere cuatro descargas en el aire, lejos del r[place] Consulte a su mdico o farmacutico si tiene preguntas acerca de la preparacin del inhalador. 3. Sacuda el inhalador durante 5 segundos antes de cada respiracin (inhalacin). 4. Coloque el inhalador de mLowe's Companiesparte superior del envase quede hLatvia 5. Coloque el dedo ndice en la parte superior del envase del medicamento. El pulgar sostiene la parte inferior del i[place] 6. A[place] 7. Coloque el inhalador eTXU Corpdientes y apriete los labios fuertemente alrededor de la boquilla, o sostenga el inhalador a unas distancia de 1 a 2pulgadas (2,5 a 5cm) de la boca abierta. Si no est seguro de qCopywriter, advertising consulte a su mdico. 8. Espire (exhale)  normalmente y lo ms profundamente posible. 9. Presione el envase hacia abajo con el dedo ndice para lProduct/process development scientist 10. Al mismo tiempo que presiona el envase, inhale profunda y lentamente hasta que los pulmones se llenen completamente. Esto debe llevarle de 4 a 6segundos. Mantenga la lengua hacia abajo. 1[place] des Mortsmedicacin en los pulmones durante 5 a 10segundos (10 segundos es lo ms indicado). Esto ayuda a que el mMattelvas areas pequeas de los pulmones. 12. Exhale lentamente a travs de los labios fruncidos. Ponga los labios como cuando silba. 13. Espere al menos 183muto entre una descarga y otCosta RicaContine con los pasos indicados anteriormente hasta que haya tomado el nmero de descargas que el mdico le indic. No use el inhalador ms de lo que su mdico le indique. 14. Vuelva a colocar la tapa en el inhalador. 15[place] su mdico o las instrucciones que vienen para liAir cabin crewSi utiliza unEducational psychologiston corticoides, enjuguese la boca con agua despus de la ltima descarga, hgase grgaras y escupa el agua. No trague el agua. EVITE LO SIGUIENTE:  Inhalar antes o despus de comenzar el aerosol del medicamento. Es necesario tener prctica para coordinar la respiracin con la emisin del aerosol.  Al emitir el aerosol, inhale por la naLawyeren lugar de hacerlo por la boca). CMO SABER SI EL INHALADOR EST LLENO O CASI VACO No puede saber cundo un inhalador est vaco al sacudirlo. Algunos inhaladores ahora vienen con contador de dosis. Pdale a su mdico que le recete el que tiene el contador de dosis si siente que necesita ayuda extra. Si el inhalador no tiene coScientist, physiological  pdale a su mdico que lo ayude a Soil scientist a llenarlo. Anote la fecha en que deber rellenarlo en un almanaque o en el envase del inhalador. Rellene el inhalador de 7 a 10das antes de que se termine. Asegrese de Cisco  un suministro adecuado del medicamento. Esto incluye asegurarse de que no est vencido y de que tiene un inhalador de repuesto.  SOLICITE ATENCIN MDICA SI:   Los sntomas solo se Engineer, mining con Forensic psychologist.  Tiene dificultad para Water quality scientist.  Aumenta la produccin de flema. SOLICITE ATENCIN MDICA DE INMEDIATO SI:   Siente poco alivio con el uso de [place], o ni tiene [place]. Contina con sibilancias y siente que le falta el aire, tiene opresin en el [place], o ambos.  Siente mareos, dolor de cabeza o una frecuencia cardaca acelerada.  Siente escalofros, fiebre o sudores nocturnos.  Nota aumento en la produccin de flema o hay sangre en la flema. ASEGRESE DE QUE:  Comprende estas instrucciones.  Controlar su afeccin.  Recibir ayuda de inmediato si no mejora o si empeora. Document Released: 05/09/2008 Document Revised: 06/07/2013 [facility] Patient Information 2015 [place], [place]. This information is not intended to replace advice given to you by your health care provider. Make sure you discuss any questions you have with your health care provider.

## 2014-01-11 NOTE — Progress Notes (Signed)
[facility] [address], [place] [id number] [phone]  Date:  01/11/2014   Name:  Stacey BeaversMercedes Gardner   DOB:  01/15/1966   MRN:  [id number]  PCP:  No PCP Per Patient    Chief Complaint: Cough and Nasal Congestion   History of Present Illness:  Stacey Gardner is a 48 y.o. very pleasant female patient who presents with the following:  Ill and called Dr L who prescribed ceftin over the phone. Now has recurrent nasal congestion and post nasal drainage that is mucopurulent. Has a cough productive of purulent sputum with no wheezing or shortness of breath.   No nausea but has some post-tussive vomiting No stool change or rash No improvement with over the counter medications or other home remedies.  Denies other complaint or health concern today.    Patient Active Problem List   Diagnosis Date Noted  . Allergic rhinitis, cause unspecified 06/24/2013  . Unspecified hypothyroidism 06/24/2013    Past Medical History  Diagnosis Date  . Hypothyroid   . Allergy   . Depression   . Anemia     History reviewed. No pertinent past surgical history.  History  Substance Use Topics  . Smoking status: Never Smoker   . Smokeless tobacco: Never Used  . Alcohol Use: No    Family History  Problem Relation Age of Onset  . Cancer Mother     breast  . Heart disease Father     Allergies  Allergen Reactions  . Codeine Nausea Only  . Cortisone Rash  . Neosporin [Neomycin-Polymyxin-Gramicidin] Rash    Medication list has been reviewed and updated.  Current Outpatient Prescriptions on File Prior to Visit  Medication Sig Dispense Refill  . albuterol (PROVENTIL HFA;VENTOLIN HFA) 108 (90 BASE) MCG/ACT inhaler Inhale 2 puffs into the lungs every 4 (four) hours as needed for wheezing. 1 Inhaler 2  . desloratadine (CLARINEX REDITAB) 5 MG disintegrating tablet Take 1 tablet (5 mg total) by mouth daily. 30 tablet 5  . levothyroxine (SYNTHROID, LEVOTHROID) 88  MCG tablet Take 1 tablet (88 mcg total) by mouth daily before breakfast. 90 tablet 2   No current facility-administered medications on file prior to visit.    Review of Systems: As per HPI, otherwise negative.    Physical Examination: Filed Vitals:   01/11/14 1807  BP: 104/78  Pulse: 77  Temp: 98.1 F (36.7 C)  Resp: 12   Filed Vitals:   01/11/14 1807  Height: 5\' 5"  (1.651 m)  Weight: 143 lb (64.864 kg)   Body mass index is 23.8 kg/(m^2). Ideal Body Weight: Weight in (lb) to have BMI = 25: 149.9  GEN: WDWN, NAD, Non-toxic, A & O x 3 HEENT: Atraumatic, Normocephalic. Neck supple. No masses, No LAD. Ears and Nose: No external deformity. CV: RRR, No M/G/R. No JVD. No thrill. No extra heart sounds. PULM: CTA B, no wheezes, crackles, rhonchi. No retractions. No resp. distress. No accessory muscle use. ABD: S, NT, ND, +BS. No rebound. No HSM. EXTR: No c/c/e NEURO Normal gait.  PSYCH: Normally interactive. Conversant. Not depressed or anxious appearing.  Calm demeanor.    Assessment and Plan: Bronchitis Sinusitis levaquin mucinex tussionex   Signed,  Phillips OdorJeffery Wladyslawa Disbro, MD

## 2014-04-12 ENCOUNTER — Ambulatory Visit: Payer: Self-pay | Admitting: Family Medicine

## 2014-04-21 ENCOUNTER — Ambulatory Visit (INDEPENDENT_AMBULATORY_CARE_PROVIDER_SITE_OTHER): Payer: 59 | Admitting: Family Medicine

## 2014-04-21 ENCOUNTER — Encounter: Payer: Self-pay | Admitting: Family Medicine

## 2014-04-21 VITALS — BP 108/68 | HR 63 | Temp 97.7°F | Resp 16 | Ht 66.5 in | Wt 142.0 lb

## 2014-04-21 DIAGNOSIS — N644 Mastodynia: Secondary | ICD-10-CM

## 2014-04-21 DIAGNOSIS — Z0000 Encounter for general adult medical examination without abnormal findings: Secondary | ICD-10-CM

## 2014-04-21 DIAGNOSIS — E559 Vitamin D deficiency, unspecified: Secondary | ICD-10-CM | POA: Diagnosis not present

## 2014-04-21 DIAGNOSIS — M25579 Pain in unspecified ankle and joints of unspecified foot: Secondary | ICD-10-CM | POA: Diagnosis not present

## 2014-04-21 DIAGNOSIS — G44329 Chronic post-traumatic headache, not intractable: Secondary | ICD-10-CM | POA: Diagnosis not present

## 2014-04-21 DIAGNOSIS — E039 Hypothyroidism, unspecified: Secondary | ICD-10-CM

## 2014-04-21 LAB — POCT URINALYSIS DIPSTICK
Bilirubin, UA: NEGATIVE
Glucose, UA: NEGATIVE
Ketones, UA: NEGATIVE
Nitrite, UA: NEGATIVE
Protein, UA: 30
Spec Grav, UA: 1.015
Urobilinogen, UA: 0.2
pH, UA: 5.5

## 2014-04-21 LAB — COMPREHENSIVE METABOLIC PANEL
ALT: 13 U/L (ref 0–35)
AST: 20 U/L (ref 0–37)
Albumin: 4.5 g/dL (ref 3.5–5.2)
Alkaline Phosphatase: 89 U/L (ref 39–117)
BUN: 17 mg/dL (ref 6–23)
CO2: 21 mEq/L (ref 19–32)
Calcium: 9.5 mg/dL (ref 8.4–10.5)
Chloride: 104 mEq/L (ref 96–112)
Creat: 0.84 mg/dL (ref 0.50–1.10)
Glucose, Bld: 76 mg/dL (ref 70–99)
Potassium: 4.1 mEq/L (ref 3.5–5.3)
Sodium: 139 mEq/L (ref 135–145)
Total Bilirubin: 0.5 mg/dL (ref 0.2–1.2)
Total Protein: 7.2 g/dL (ref 6.0–8.3)

## 2014-04-21 LAB — TSH: TSH: 2.602 u[IU]/mL (ref 0.350–4.500)

## 2014-04-21 LAB — CBC
HCT: 39.5 % (ref 36.0–46.0)
Hemoglobin: 13.8 g/dL (ref 12.0–15.0)
MCH: 31.1 pg (ref 26.0–34.0)
MCHC: 34.9 g/dL (ref 30.0–36.0)
MCV: 89 fL (ref 78.0–100.0)
MPV: 10.8 fL (ref 8.6–12.4)
Platelets: 284 10*3/uL (ref 150–400)
RBC: 4.44 MIL/uL (ref 3.87–5.11)
RDW: 13.7 % (ref 11.5–15.5)
WBC: 6.2 10*3/uL (ref 4.0–10.5)

## 2014-04-21 LAB — POCT UA - MICROSCOPIC ONLY
Bacteria, U Microscopic: NEGATIVE
Casts, Ur, LPF, POC: NEGATIVE
Crystals, Ur, HPF, POC: NEGATIVE
Mucus, UA: NEGATIVE
Yeast, UA: NEGATIVE

## 2014-04-21 LAB — LIPID PANEL
Cholesterol: 212 mg/dL — ABNORMAL HIGH (ref 0–200)
HDL: 47 mg/dL (ref >=46)
LDL Cholesterol: 135 mg/dL — ABNORMAL HIGH (ref 0–99)
Total CHOL/HDL Ratio: 4.5 Ratio
Triglycerides: 150 mg/dL — ABNORMAL HIGH (ref <150)
VLDL: 30 mg/dL (ref 0–40)

## 2014-04-21 LAB — POCT SEDIMENTATION RATE: POCT SED RATE: 22 mm/hr (ref 0–22)

## 2014-04-21 NOTE — Patient Instructions (Signed)
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
## 2014-04-22 ENCOUNTER — Other Ambulatory Visit: Payer: Self-pay | Admitting: Family Medicine

## 2014-04-22 DIAGNOSIS — E039 Hypothyroidism, unspecified: Secondary | ICD-10-CM

## 2014-04-22 LAB — VITAMIN D 25 HYDROXY (VIT D DEFICIENCY, FRACTURES): Vit D, 25-Hydroxy: 22 ng/mL — ABNORMAL LOW (ref 30–100)

## 2014-04-22 MED ORDER — LEVOTHYROXINE SODIUM 88 MCG PO TABS: 88.0000 ug | ORAL_TABLET | Freq: Every day | ORAL | Status: DC

## 2014-05-04 ENCOUNTER — Other Ambulatory Visit: Payer: Self-pay

## 2014-05-16 ENCOUNTER — Ambulatory Visit (INDEPENDENT_AMBULATORY_CARE_PROVIDER_SITE_OTHER): Payer: 59 | Admitting: Sports Medicine

## 2014-05-16 ENCOUNTER — Encounter: Payer: Self-pay | Admitting: Sports Medicine

## 2014-05-16 VITALS — BP 117/78 | Ht 70.0 in | Wt 138.9 lb

## 2014-05-16 DIAGNOSIS — M79672 Pain in left foot: Secondary | ICD-10-CM | POA: Diagnosis not present

## 2014-05-16 DIAGNOSIS — M216X9 Other acquired deformities of unspecified foot: Secondary | ICD-10-CM

## 2014-05-16 DIAGNOSIS — M79671 Pain in right foot: Secondary | ICD-10-CM

## 2014-05-16 NOTE — Progress Notes (Signed)
   Subjective:    Patient ID: Stacey BeaversMercedes Gardner, female    DOB: 09/22/1965, 49 y.o.   MRN: [id number]  HPI chief complaint: Bilateral feet pain  Very pleasant 49 year old female comes in today complaining of chronic bilateral foot pain. She has had pain for many years. She is from [place] and states that she had orthotics made for her in [place] but that was many years ago. She has not used any sort of insert recently. Her pain is diffuse from the midfoot down into the forefoot. She denies any pain in her heel. She denies any soft tissue swelling. Symptoms are worse with weightbearing and walking. Improve at rest. She also has noticed some prominence at the base of her fifth metatarsal bilaterally. She is here today with her husband. Past medical history reviewed Medications reviewed Allergies reviewed    Review of Systems     Objective:   Physical Exam Well-developed, well-nourished. No acute distress. Awake alert and oriented 3. Vital signs reviewed.  Examination of her feet in the standing position shows a cavus foot bilaterally. She has transverse arch collapse bilaterally as well as prominence at the base of the fifth metatarsals bilaterally consistent with either with a large fifth metatarsal base or possibly an os perineum. No soft tissue swelling. There is no tenderness to palpation throughout the foot. Neurovascularly intact distally. She has a rather neutral gait with walking.       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 We will try scaphoid pads for arch support and metatarsal pads for transverse arch support. Patient will follow-up with me when she returns from an upcoming trip to [place]. If she finds the temporary inserts to be comfortable, we will plan on making her custom orthotics at her follow-up visit. She will notify us of any questions or concerns in the interim.

## 2014-06-20 ENCOUNTER — Encounter: Payer: Self-pay | Admitting: *Deleted

## 2014-06-20 NOTE — Consult Note (Signed)
Exam:  CT Head or Brain without contrast  Reason for visit:  Migraine HA  Findings:  No intracranial hemorrhage, mass or hydrocephalus.  No areas of abnormal attenuation are identified within the brain.  No extra-axial lesions.  Bone window images are unremarkable.  Impression:  Normal study  Evelina Bucyennis Clemens, MD 05/23/2014 at [phone]

## 2014-06-21 ENCOUNTER — Encounter: Payer: 59 | Admitting: Sports Medicine

## 2014-06-27 ENCOUNTER — Encounter: Payer: 59 | Admitting: Sports Medicine

## 2014-06-30 ENCOUNTER — Other Ambulatory Visit: Payer: Self-pay | Admitting: Family Medicine

## 2014-07-14 ENCOUNTER — Encounter: Payer: Self-pay | Admitting: Family Medicine

## 2014-07-19 ENCOUNTER — Encounter: Payer: 59 | Admitting: Sports Medicine

## 2014-07-27 ENCOUNTER — Other Ambulatory Visit: Payer: Self-pay | Admitting: Family Medicine

## 2014-07-27 DIAGNOSIS — L700 Acne vulgaris: Secondary | ICD-10-CM

## 2014-07-27 MED ORDER — MINOCYCLINE HCL 50 MG PO CAPS: 50.0000 mg | ORAL_CAPSULE | Freq: Two times a day (BID) | ORAL | Status: DC

## 2014-08-26 ENCOUNTER — Other Ambulatory Visit: Payer: Self-pay | Admitting: Family Medicine

## 2014-08-26 DIAGNOSIS — Z9109 Other allergy status, other than to drugs and biological substances: Secondary | ICD-10-CM

## 2014-08-26 MED ORDER — MONTELUKAST SODIUM 10 MG PO TABS: 10.0000 mg | ORAL_TABLET | Freq: Every day | ORAL | Status: DC

## 2014-10-13 ENCOUNTER — Other Ambulatory Visit: Payer: Self-pay

## 2014-10-13 DIAGNOSIS — J9801 Acute bronchospasm: Secondary | ICD-10-CM

## 2014-10-13 NOTE — Telephone Encounter (Signed)
Dr L, you saw pt back in March for CPE, but I don't see albuterol discussed recently. Do you want to give RFs or have pt RTC for eval?

## 2014-10-14 MED ORDER — ALBUTEROL SULFATE HFA 108 (90 BASE) MCG/ACT IN AERS: 2.0000 | INHALATION_SPRAY | RESPIRATORY_TRACT | Status: DC | PRN

## 2015-01-02 ENCOUNTER — Encounter: Payer: Self-pay | Admitting: Family Medicine

## 2015-01-02 ENCOUNTER — Ambulatory Visit (INDEPENDENT_AMBULATORY_CARE_PROVIDER_SITE_OTHER): Payer: 59 | Admitting: Family Medicine

## 2015-01-02 VITALS — BP 110/71 | HR 70 | Temp 97.6°F | Resp 16 | Ht 70.0 in | Wt 147.0 lb

## 2015-01-02 DIAGNOSIS — J309 Allergic rhinitis, unspecified: Secondary | ICD-10-CM | POA: Diagnosis not present

## 2015-01-02 DIAGNOSIS — M2550 Pain in unspecified joint: Secondary | ICD-10-CM | POA: Diagnosis not present

## 2015-01-02 DIAGNOSIS — L270 Generalized skin eruption due to drugs and medicaments taken internally: Secondary | ICD-10-CM

## 2015-01-02 LAB — RHEUMATOID FACTOR: Rhuematoid fact SerPl-aCnc: <10 IU/mL (ref <=14)

## 2015-01-02 MED ORDER — FLUTICASONE PROPIONATE 50 MCG/ACT NA SUSP: 2.0000 | Freq: Every day | NASAL | Status: DC

## 2015-01-02 NOTE — Patient Instructions (Signed)
For nasal congestion/rhinitis  Please use Afrin nasal spray twice a day for 3 days. For those three days, use flonase nasal spray after you use the afrin then use the flonase once daily.  Can try over the counter sudafed- one in the morning and one after lunch as needed for congestion, facial and ear pressure Try saline nasal spray for comfort and to rinse allergens from your nasal passages. The brand Jannetta Quintyr is a little thicker and helps with moisture.  Please come back in if you develop fever, recurrence of rash or worsening congestion/pain not helped by above measures.   Allergic Rhinitis Allergic rhinitis is when the mucous membranes in the nose respond to allergens. Allergens are particles in the air that cause your body to have an allergic reaction. This causes you to release allergic antibodies. Through a chain of events, these eventually cause you to release histamine into the blood stream. Although meant to protect the body, it is this release of histamine that causes your discomfort, such as frequent sneezing, congestion, and an itchy, runny nose.  CAUSES Seasonal allergic rhinitis (hay fever) is caused by pollen allergens that may come from grasses, trees, and weeds. Year-round allergic rhinitis (perennial allergic rhinitis) is caused by allergens such as house dust mites, pet dander, and mold spores. SYMPTOMS  Nasal stuffiness (congestion).  Itchy, runny nose with sneezing and tearing of the eyes. DIAGNOSIS Your health care provider can help you determine the allergen or allergens that trigger your symptoms. If you and your health care provider are unable to determine the allergen, skin or blood testing may be used. Your health care provider will diagnose your condition after taking your health history and performing a physical exam. Your health care provider may assess you for other related conditions, such as asthma, pink eye, or an ear infection. TREATMENT Allergic rhinitis does not  have a cure, but it can be controlled by:  Medicines that block allergy symptoms. These may include allergy shots, nasal sprays, and oral antihistamines.  Avoiding the allergen. Hay fever may often be treated with antihistamines in pill or nasal spray forms. Antihistamines block the effects of histamine. There are over-the-counter medicines that may help with nasal congestion and swelling around the eyes. Check with your health care provider before taking or giving this medicine. If avoiding the allergen or the medicine prescribed do not work, there are many new medicines your health care provider can prescribe. Stronger medicine may be used if initial measures are ineffective. Desensitizing injections can be used if medicine and avoidance does not work. Desensitization is when a patient is given ongoing shots until the body becomes less sensitive to the allergen. Make sure you follow up with your health care provider if problems continue. HOME CARE INSTRUCTIONS It is not possible to completely avoid allergens, but you can reduce your symptoms by taking steps to limit your exposure to them. It helps to know exactly what you are allergic to so that you can avoid your specific triggers. SEEK MEDICAL CARE IF:  You have a fever.  You develop a cough that does not stop easily (persistent).  You have shortness of breath.  You start wheezing.  Symptoms interfere with normal daily activities.   This information is not intended to replace advice given to you by your health care provider. Make sure you discuss any questions you have with your health care provider.   Document Released: 10/16/2000 Document Revised: 02/11/2014 Document Reviewed: 09/28/2012 Elsevier Interactive Patient Education 2016 Elsevier  Inc.  

## 2015-01-02 NOTE — Progress Notes (Signed)
Subjective:    Patient ID: Stacey Gardner, female    DOB: 09/25/1965, [age] y.o.   MRN: [id number]  HPI This is a pleasant 49 yo patient who presents today with complaint of a rash she had last week and joint pain. She was in [place] for her father's 80th birthday and had some nasal congestion. She took some of her family's cough medicine. She brings the box int with her today, it seems to be a mucolytic and dextromethorphan. About a day after taking the cough medicine, she noticed a rash and facial swelling (right eye) that lasted for 2-3 days. She did not have any oral swelling, SOB or chest tightness. She started zyrtec and benadryl lotion with eventual relief of rash. Currently, she is having joint aches in her hands and feet that started with the rash. She thinks her knuckles are mildly swollen. She has not had pain or swelling like this before. She has photos of the rash that show a wide spread, erythematous macular rash.   Past Medical History  Diagnosis Date  . Hypothyroid   . Allergy   . Depression   . Anemia   No past surgical history on file. Family History  Problem Relation Age of Onset  . Cancer Mother     breast  . Heart disease Father    Social History  Substance Use Topics  . Smoking status: Never Smoker   . Smokeless tobacco: Never Used  . Alcohol Use: No   Review of Systems  Constitutional: Negative for fever, chills and fatigue.  HENT: Positive for facial swelling (last week, with rash), rhinorrhea and sinus pressure.   Respiratory: Negative for cough, chest tightness, shortness of breath, wheezing and stridor.   Cardiovascular: Negative for chest pain and leg swelling.  Allergic/Immunologic: Positive for environmental allergies.      Objective:   Physical Exam  Constitutional: She is oriented to person, place, and time. She appears well-developed and well-nourished. No distress.  HENT:  Head: Normocephalic and atraumatic.  Right Ear: External ear and  ear canal normal. Tympanic membrane is retracted.  Left Ear: External ear and ear canal normal. Tympanic membrane is retracted.  Nose: Mucosal edema and rhinorrhea present. Right sinus exhibits no maxillary sinus tenderness and no frontal sinus tenderness. Left sinus exhibits no maxillary sinus tenderness and no frontal sinus tenderness.  Mouth/Throat: Uvula is midline and mucous membranes are normal. Posterior oropharyngeal erythema present. No oropharyngeal exudate or posterior oropharyngeal edema.  Cardiovascular: Normal rate, regular rhythm and normal heart sounds.   Pulmonary/Chest: Effort normal and breath sounds normal.  Musculoskeletal: Normal range of motion.  I do not appreciate any joint swelling or effusion of hand joints. No redness, no TTP. Full ROM. Circulation intact.   Neurological: She is alert and oriented to person, place, and time.  Skin: Skin is warm and dry. No rash noted. She is not diaphoretic.  Psychiatric: She has a normal mood and affect. Her behavior is normal. Judgment and thought content normal.  Vitals reviewed. BP 110/71 mmHg  Pulse 70  Temp(Src) 97.6 F (36.4 C)  Resp 16  Ht 5\' 10"  (1.778 m)  Wt 147 lb (66.679 kg)  BMI 21.09 kg/m2     Assessment & Plan:  1. Rash, drug contact - Sedimentation Rate - Rheumatoid factor - ANA  2. Joint pain - Sedimentation Rate - Rheumatoid factor - ANA  3. Allergic rhinitis, unspecified allergic rhinitis type -Provided written and verbal information regarding diagnosis and treatment, including OTC  symptomatic relief measures - continue Zyrtec - fluticasone (FLONASE) 50 MCG/ACT nasal spray; Place 2 sprays into both nostrils daily.  Dispense: 16 g; Refill: 6  - RTC precautions given  Olean Ree, FNP-BC  [facility]  01/04/2015 8:33 AM

## 2015-01-03 LAB — SEDIMENTATION RATE: Sed Rate: 11 mm/hr (ref 0–20)

## 2015-01-04 LAB — ANA: Anti Nuclear Antibody(ANA): NEGATIVE

## 2015-01-05 ENCOUNTER — Encounter: Payer: Self-pay | Admitting: *Deleted

## 2015-01-17 ENCOUNTER — Other Ambulatory Visit: Payer: Self-pay | Admitting: Family Medicine

## 2015-01-17 DIAGNOSIS — J0101 Acute recurrent maxillary sinusitis: Secondary | ICD-10-CM

## 2015-01-17 MED ORDER — AMOXICILLIN 500 MG PO CAPS: 500.0000 mg | ORAL_CAPSULE | Freq: Three times a day (TID) | ORAL | Status: DC

## 2015-05-05 ENCOUNTER — Other Ambulatory Visit: Payer: Self-pay | Admitting: Family Medicine

## 2015-05-25 ENCOUNTER — Other Ambulatory Visit: Payer: Self-pay | Admitting: Family Medicine

## 2015-05-25 MED ORDER — LEVOTHYROXINE SODIUM 88 MCG PO TABS: ORAL_TABLET | ORAL | Status: DC

## 2015-07-01 ENCOUNTER — Other Ambulatory Visit: Payer: Self-pay | Admitting: Family Medicine

## 2015-07-01 MED ORDER — ALBUTEROL SULFATE (2.5 MG/3ML) 0.083% IN NEBU: 2.5000 mg | INHALATION_SOLUTION | Freq: Four times a day (QID) | RESPIRATORY_TRACT | Status: DC | PRN

## 2015-07-21 ENCOUNTER — Telehealth: Payer: Self-pay

## 2015-07-21 NOTE — Telephone Encounter (Signed)
Dr Milus GlazierLauenstein sent me a [email or web address] email asking me to check on getting a nebulizer machine for this pt per her request. We do not have any Dxs in EPIC that would allow for insurance coverage for this. Pt will need to come in to discuss and have eval for need for this machine in order to have documentation and Dx for insurance coverage. LMOM for pt to CB.

## 2015-08-14 DIAGNOSIS — Z1151 Encounter for screening for human papillomavirus (HPV): Secondary | ICD-10-CM | POA: Diagnosis not present

## 2015-08-14 DIAGNOSIS — Z01419 Encounter for gynecological examination (general) (routine) without abnormal findings: Secondary | ICD-10-CM | POA: Diagnosis not present

## 2015-08-14 DIAGNOSIS — Z1231 Encounter for screening mammogram for malignant neoplasm of breast: Secondary | ICD-10-CM | POA: Diagnosis not present

## 2015-08-14 DIAGNOSIS — Z6825 Body mass index (BMI) 25.0-25.9, adult: Secondary | ICD-10-CM | POA: Diagnosis not present

## 2015-08-15 DIAGNOSIS — M79671 Pain in right foot: Secondary | ICD-10-CM | POA: Diagnosis not present

## 2015-08-15 DIAGNOSIS — M25551 Pain in right hip: Secondary | ICD-10-CM | POA: Diagnosis not present

## 2015-08-16 LAB — HM PAP SMEAR: HM Pap smear: NORMAL

## 2015-10-18 ENCOUNTER — Ambulatory Visit: Payer: Self-pay

## 2015-12-21 ENCOUNTER — Encounter: Payer: Self-pay | Admitting: Family Medicine

## 2015-12-21 ENCOUNTER — Ambulatory Visit (INDEPENDENT_AMBULATORY_CARE_PROVIDER_SITE_OTHER): Payer: BLUE CROSS/BLUE SHIELD | Admitting: Family Medicine

## 2015-12-21 VITALS — BP 120/70 | HR 63 | Ht 65.25 in | Wt 148.4 lb

## 2015-12-21 DIAGNOSIS — E038 Other specified hypothyroidism: Secondary | ICD-10-CM

## 2015-12-21 DIAGNOSIS — R51 Headache: Secondary | ICD-10-CM | POA: Diagnosis not present

## 2015-12-21 DIAGNOSIS — K219 Gastro-esophageal reflux disease without esophagitis: Secondary | ICD-10-CM

## 2015-12-21 DIAGNOSIS — E063 Autoimmune thyroiditis: Secondary | ICD-10-CM | POA: Diagnosis not present

## 2015-12-21 DIAGNOSIS — Z7689 Persons encountering health services in other specified circumstances: Secondary | ICD-10-CM

## 2015-12-21 DIAGNOSIS — R519 Headache, unspecified: Secondary | ICD-10-CM

## 2015-12-21 LAB — CBC WITH DIFFERENTIAL/PLATELET
BASOS PCT: 0 %
Basophils Absolute: 0 cells/uL (ref 0–200)
EOS PCT: 1 %
Eosinophils Absolute: 73 cells/uL (ref 15–500)
HCT: 39.3 % (ref 35.0–45.0)
HEMOGLOBIN: 13.4 g/dL (ref 11.7–15.5)
LYMPHS ABS: 2409 {cells}/uL (ref 850–3900)
Lymphocytes Relative: 33 %
MCH: 31 pg (ref 27.0–33.0)
MCHC: 34.1 g/dL (ref 32.0–36.0)
MCV: 91 fL (ref 80.0–100.0)
MPV: 10.5 fL (ref 7.5–12.5)
Monocytes Absolute: 584 cells/uL (ref 200–950)
Monocytes Relative: 8 %
NEUTROS ABS: 4234 {cells}/uL (ref 1500–7800)
Neutrophils Relative %: 58 %
PLATELETS: 295 10*3/uL (ref 140–400)
RBC: 4.32 MIL/uL (ref 3.80–5.10)
RDW: 14 % (ref 11.0–15.0)
WBC: 7.3 10*3/uL (ref 4.0–10.5)

## 2015-12-21 LAB — COMPREHENSIVE METABOLIC PANEL
ALBUMIN: 4.6 g/dL (ref 3.6–5.1)
ALT: 15 U/L (ref 6–29)
AST: 21 U/L (ref 10–35)
Alkaline Phosphatase: 96 U/L (ref 33–130)
BILIRUBIN TOTAL: 0.4 mg/dL (ref 0.2–1.2)
BUN: 20 mg/dL (ref 7–25)
CO2: 25 mmol/L (ref 20–31)
Calcium: 9.4 mg/dL (ref 8.6–10.4)
Chloride: 102 mmol/L (ref 98–110)
Creat: 0.92 mg/dL (ref 0.50–1.05)
Glucose, Bld: 84 mg/dL (ref 65–99)
Potassium: 4.1 mmol/L (ref 3.5–5.3)
SODIUM: 136 mmol/L (ref 135–146)
TOTAL PROTEIN: 7.5 g/dL (ref 6.1–8.1)

## 2015-12-21 LAB — TSH: TSH: 2.82 m[IU]/L

## 2015-12-21 MED ORDER — OMEPRAZOLE 20 MG PO CPDR: 20.0000 mg | DELAYED_RELEASE_CAPSULE | Freq: Every day | ORAL | 3 refills | Status: DC

## 2015-12-21 NOTE — Patient Instructions (Addendum)
Stay well hydrated (drink at least 4-5 eight ounce glasses of water daily) Avoid foods that cause reflux. Do not eat and lay down.  Try the Omeprazole 30 minutes before your evening meal since you take your thyroid medication in the mornings. Continue on an antacid medication for the next 2 months while you are out of the country and follow up when you return.  We will call you with lab results.    Food Choices for Gastroesophageal Reflux Disease, Adult When you have gastroesophageal reflux disease (GERD), the foods you eat and your eating habits are very important. Choosing the right foods can help ease your discomfort. What guidelines do I need to follow?  Choose fruits, vegetables, whole grains, and low-fat dairy products.  Choose low-fat meat, fish, and poultry.  Limit fats such as oils, salad dressings, butter, nuts, and avocado.  Keep a food diary. This helps you identify foods that cause symptoms.  Avoid foods that cause symptoms. These may be different for everyone.  Eat small meals often instead of 3 large meals a day.  Eat your meals slowly, in a place where you are relaxed.  Limit fried foods.  Cook foods using methods other than frying.  Avoid drinking alcohol.  Avoid drinking large amounts of liquids with your meals.  Avoid bending over or lying down until 2-3 hours after eating. What foods are not recommended? These are some foods and drinks that may make your symptoms worse: Vegetables  Tomatoes. Tomato juice. Tomato and spaghetti sauce. Chili peppers. Onion and garlic. Horseradish. Fruits  Oranges, grapefruit, and lemon (fruit and juice). Meats  High-fat meats, fish, and poultry. This includes hot dogs, ribs, ham, sausage, salami, and bacon. Dairy  Whole milk and chocolate milk. Sour cream. Cream. Butter. Ice cream. Cream cheese. Drinks  Coffee and tea. Bubbly (carbonated) drinks or energy drinks. Condiments  Hot sauce. Barbecue sauce. Sweets/Desserts    Chocolate and cocoa. Donuts. Peppermint and spearmint. Fats and Oils  High-fat foods. This includes [place] fries and potato chips. Other  Vinegar. Strong spices. This includes black pepper, white pepper, red pepper, cayenne, curry powder, cloves, ginger, and chili powder. The items listed above may not be a complete list of foods and drinks to avoid. Contact your dietitian for more information.  This information is not intended to replace advice given to you by your health care provider. Make sure you discuss any questions you have with your health care provider. Document Released: 07/23/2011 Document Revised: 06/29/2015 Document Reviewed: 11/25/2012 Elsevier Interactive Patient Education  2017 Elsevier Inc.   Heartburn Heartburn is a type of pain or discomfort that can happen in the throat or chest. It is often described as a burning pain. It may also cause a bad taste in the mouth. Heartburn may feel worse when you lie down or bend over, and it is often worse at night. Heartburn may be caused by stomach contents that move back up into the esophagus (reflux). Follow these instructions at home: Take these actions to decrease your discomfort and to help avoid complications. Diet  Follow a diet as recommended by your health care provider. This may involve avoiding foods and drinks such as:  Coffee and tea (with or without caffeine).  Drinks that contain alcohol.  Energy drinks and sports drinks.  Carbonated drinks or sodas.  Chocolate and cocoa.  Peppermint and mint flavorings.  Garlic and onions.  Horseradish.  Spicy and acidic foods, including peppers, chili powder, curry powder, vinegar, hot sauces, and  barbecue sauce.  Citrus fruit juices and citrus fruits, such as oranges, lemons, and limes.  Tomato-based foods, such as red sauce, chili, salsa, and pizza with red sauce.  Fried and fatty foods, such as donuts, french fries, potato chips, and high-fat  dressings.  High-fat meats, such as hot dogs and fatty cuts of red and white meats, such as rib eye steak, sausage, ham, and bacon.  High-fat dairy items, such as whole milk, butter, and cream cheese.  Eat small, frequent meals instead of large meals.  Avoid drinking large amounts of liquid with your meals.  Avoid eating meals during the 2-3 hours before bedtime.  Avoid lying down right after you eat.  Do not exercise right after you eat. General instructions  Pay attention to any changes in your symptoms.  Take over-the-counter and prescription medicines only as told by your health care provider. Do not take aspirin, ibuprofen, or other NSAIDs unless your health care provider told you to do so.  Do not use any tobacco products, including cigarettes, chewing tobacco, and e-cigarettes. If you need help quitting, ask your health care provider.  Wear loose-fitting clothing. Do not wear anything tight around your waist that causes pressure on your abdomen.  Raise (elevate) the head of your bed about 6 inches (15 cm).  Try to reduce your stress, such as with yoga or meditation. If you need help reducing stress, ask your health care provider.  If you are overweight, reduce your weight to an amount that is healthy for you. Ask your health care provider for guidance about a safe weight loss goal.  Keep all follow-up visits as told by your health care provider. This is important. Contact a health care provider if:  You have new symptoms.  You have unexplained weight loss.  You have difficulty swallowing, or it hurts to swallow.  You have wheezing or a persistent cough.  Your symptoms do not improve with treatment.  You have frequent heartburn for more than two weeks. Get help right away if:  You have pain in your arms, neck, jaw, teeth, or back.  You feel sweaty, dizzy, or light-headed.  You have chest pain or shortness of breath.  You vomit and your vomit looks like blood  or coffee grounds.  Your stool is bloody or black. This information is not intended to replace advice given to you by your health care provider. Make sure you discuss any questions you have with your health care provider. Document Released: 06/09/2008 Document Revised: 06/29/2015 Document Reviewed: 05/18/2014 Elsevier Interactive Patient Education  2017 [employer]

## 2015-12-21 NOTE — Progress Notes (Signed)
Subjective:    Patient ID: Stacey Gardner, female    DOB: 06/18/1965, 50 y.o.   MRN: [id number]  HPI Chief Complaint  Patient presents with  . new pt    new pt established. any time during the day, she will get on a coughing spell and have to throw up- 3 years, ringing in the ears- sometimes makes her alittle dizziness, stressing out- losing hair., headaches- excerin med helps some   She is new to the practice and here for a complete physical exam. Is from [place] and moved here in 2007.  Previous medical care: Dr. Milus GlazierLauenstein. Last there approximately 1 year ago.  Last CPE: March 2016 States she has had Hepatitis A in past.   States she has problems with digestion and this has been ongoing for over a year. states she has coughing spells and thinks this Is related to reflux. Reports she feels the need to throw up sometimes and occasionally she has difficulty swallowing. Denies taking any medication or being evaluated for this in the past year. She tries to avoid yogurt and other foods that make this flare up. Denies drinking alcohol, does not smoke. Has been taking NSAIDS for headaches. Denies fever, chills, unexplained weight loss, fatigue, chest pain, palpitations, shortness of breath, abdominal pain, N/V/D. No changes to bowel habits. No blood in stool   Other providers: Dr. Juliene PinaMody at OB[facility].   Also complains of headaches, daily and chronic for years. Wakes up with headaches. Takes Exedrin migraine for these a couple days of the week with some relief.  No vision issues. Denies numbness, tingling, weakness.    Past medical history includes hypothyroidism she takes daily levothyroxine and no labs for one year.  No pregnancies.   Social history: Lives with husband, does not work.  Denies smoking, drinking alcohol, drug use  Diet: vegetarian  LMP: today. Irregular.   Reviewed allergies, medications, past medical, surgical, family, and social history.    Review of  Systems Pertinent positives and negatives in the history of present illness.     Objective:   Physical Exam  Constitutional: She is oriented to person, place, and time. She appears well-developed and well-nourished. No distress.  HENT:  Right Ear: Tympanic membrane and ear canal normal.  Left Ear: Tympanic membrane and ear canal normal.  Nose: Nose normal.  Mouth/Throat: Uvula is midline, oropharynx is clear and moist and mucous membranes are normal.  Eyes: Conjunctivae and EOM are normal. Pupils are equal, round, and reactive to light.  Neck: Normal range of motion and full passive range of motion without pain. Neck supple.  Cardiovascular: Normal rate, regular rhythm, normal heart sounds and normal pulses.   Pulmonary/Chest: Effort normal and breath sounds normal.  Abdominal: Soft. Normal appearance and bowel sounds are normal. There is no hepatosplenomegaly. There is no tenderness. There is no rebound and no CVA tenderness.  Neurological: She is alert and oriented to person, place, and time. She has normal strength. No cranial nerve deficit or sensory deficit. Coordination and gait normal.  Skin: Skin is warm and dry. No pallor.  Psychiatric: She has a normal mood and affect. Her speech is normal and behavior is normal. Judgment and thought content normal. Cognition and memory are normal.   BP 120/70   Pulse 63   Ht 5' 5.25" (1.657 m)   Wt 148 lb 6.4 oz (67.3 kg)   LMP 12/21/2015   BMI 24.51 kg/m       Assessment & Plan:  Gastroesophageal  reflux disease, esophagitis presence not specified - Plan: CBC with Differential/Platelet, Comprehensive metabolic panel  Chronic daily headache - Plan: CBC with Differential/Platelet, Comprehensive metabolic panel  Encounter to establish care  Hypothyroidism due to Hashimoto's thyroiditis - Plan: TSH  Suspect she has GERD and she has not tried any medication for this yet. She is aware of certain foods that trigger this and will avoid  them. Counseled on GERD management and recommend that she start taking daily Omeprazole. Samples given today.  She will follow up in 2 months when she returns from her trip and let me know how she is doing. Will consider GI referral at that point.  Review of chart shows that she has a history of headaches and had a negative CT head in 2016. Headaches are unchanged. She was advised to hydrate as it does sound that she is not well hydrated. Advised to only take Excedrin or NSAIDS when absolutely necessary since this can worsen reflux.  Will check TSH and adjust thyroid medication as appropriate.  She will return for a CPE and fasting labs at her convenience.

## 2016-03-04 ENCOUNTER — Ambulatory Visit (INDEPENDENT_AMBULATORY_CARE_PROVIDER_SITE_OTHER): Payer: BLUE CROSS/BLUE SHIELD | Admitting: Family Medicine

## 2016-03-04 ENCOUNTER — Encounter: Payer: Self-pay | Admitting: Family Medicine

## 2016-03-04 VITALS — BP 120/70 | HR 75 | Ht 66.5 in | Wt 152.2 lb

## 2016-03-04 DIAGNOSIS — E782 Mixed hyperlipidemia: Secondary | ICD-10-CM | POA: Insufficient documentation

## 2016-03-04 DIAGNOSIS — Z8639 Personal history of other endocrine, nutritional and metabolic disease: Secondary | ICD-10-CM

## 2016-03-04 DIAGNOSIS — E559 Vitamin D deficiency, unspecified: Secondary | ICD-10-CM | POA: Diagnosis not present

## 2016-03-04 DIAGNOSIS — Z0000 Encounter for general adult medical examination without abnormal findings: Secondary | ICD-10-CM

## 2016-03-04 DIAGNOSIS — R252 Cramp and spasm: Secondary | ICD-10-CM | POA: Diagnosis not present

## 2016-03-04 DIAGNOSIS — R202 Paresthesia of skin: Secondary | ICD-10-CM | POA: Insufficient documentation

## 2016-03-04 HISTORY — DX: Mixed hyperlipidemia: E78.2

## 2016-03-04 LAB — POCT URINALYSIS DIPSTICK
BILIRUBIN UA: NEGATIVE
Blood, UA: NEGATIVE
Glucose, UA: NEGATIVE
Ketones, UA: NEGATIVE
LEUKOCYTES UA: NEGATIVE
Nitrite, UA: NEGATIVE
Protein, UA: NEGATIVE
SPEC GRAV UA: 1.025
Urobilinogen, UA: NEGATIVE
pH, UA: 5.5

## 2016-03-04 LAB — FERRITIN: Ferritin: 19 ng/mL (ref 10–232)

## 2016-03-04 LAB — VITAMIN B12: VITAMIN B 12: 410 pg/mL (ref 200–1100)

## 2016-03-04 NOTE — Progress Notes (Signed)
Subjective:    Patient ID: Stacey Gardner, female    DOB: 01/04/1966, 51 y.o.   MRN: [id number]  HPI Chief Complaint  Patient presents with  . cpe    fasting cpe-, legs and hands feel like they go to sleep. calling for eye appt   She is here for a complete physical exam. She is from [place]. Moved here in 2007.  History of vitamin D deficiency and she is not taking a vitamin D supplement.  States she has a 4 year history of intermittent musle pain in her legs that feels like numbness, tingling and occasional muscle cramps. Also complains of numbness and tingling in her hands that is also intermittent and ongoing for the same length of time. Cramps during night and daytime, cannot relate symptoms to anything. Describes discomfort as pins and needles. States her brother has the same problem. States she has not seen anyone in the past for this.    She is fasting today and has history of elevated LDL.   Last CPE: March 2016   Other providers: OB/GYN Dr. Juliene PinaMody.   Past medical history: hypothyroidism, headaches, GERD, history of hepatitis A  Social history: Lives with husband, does not work. Denies smoking, drinking alcohol, drug use  Diet: mostly vegetarian.  Excerise: nothing  Immunizations: Tap at [facility] in the past year. Flu shot up to date.   Health maintenance:  Mammogram: 2017 Colonoscopy: 3 years ago. Is due in 2 years. Had polyps.  Last Gynecological Exam: has OB/GYN  Last Menstrual cycle: irregular.  Pregnancies: 0 Last Dental Exam: last week. Twice annually.  Last Eye Exam: last year. Has a cataract in right eye.   Wears seatbelt always, uses sunscreen, smoke detectors in home and functioning, does not drive.  Reviewed allergies, medications, past medical, surgical, family, and social history.    Review of Systems Review of Systems Constitutional: -fever, -chills, -sweats, -unexpected weight change,-fatigue ENT: -runny nose, -ear pain, -sore  throat Cardiology:  -chest pain, -palpitations, -edema Respiratory: -cough, -shortness of breath, -wheezing Gastroenterology: -abdominal pain, -nausea, -vomiting, -diarrhea, -constipation  Hematology: -bleeding or bruising problems Musculoskeletal: -arthralgias, -myalgias, -joint swelling, -back pain Ophthalmology: -vision changes Urology: -dysuria, -difficulty urinating, -hematuria, -urinary frequency, -urgency Neurology: -headache, -weakness, -+ingling, +numbness       Objective:   Physical Exam BP 120/70   Pulse 75   Ht 5' 6.5" (1.689 m)   Wt 152 lb 3.2 oz (69 kg)   BMI 24.20 kg/m   General Appearance:    Alert, cooperative, no distress, appears stated age  Head:    Normocephalic, without obvious abnormality, atraumatic  Eyes:    PERRL, conjunctiva/corneas clear, EOM's intact, fundi    benign  Ears:    Normal TM's and external ear canals  Nose:   Nares normal, mucosa normal, no drainage or sinus   tenderness  Throat:   Lips, mucosa, and tongue normal; teeth and gums normal  Neck:   Supple, no lymphadenopathy;  thyroid:  no   enlargement/tenderness/nodules; no carotid   bruit or JVD  Back:    Spine nontender, no curvature, ROM normal, no CVA     tenderness  Lungs:     Clear to auscultation bilaterally without wheezes, rales or     ronchi; respirations unlabored  Chest Wall:    No tenderness or deformity   Heart:    Regular rate and rhythm, S1 and S2 normal, no murmur, rub   or gallop  Breast Exam:  OB/GYN and up to date.   Abdomen:     Soft, non-tender, nondistended, normoactive bowel sounds,    no masses, no hepatosplenomegaly  Genitalia:    OB/GYN and up to date  Rectal:    deferred  Extremities:   No clubbing, cyanosis or edema  Pulses:   2+ and symmetric all extremities  Skin:   Skin color, texture, turgor normal, no rashes or lesions  Lymph nodes:   Cervical, supraclavicular, and axillary nodes normal  Neurologic:   CNII-XII intact, normal strength, sensation and  gait; reflexes 2+ and symmetric throughout          Psych:   Normal mood, affect, hygiene and grooming.     Urinalysis dipstick: negative      Assessment & Plan:  Routine general medical examination at a health care facility - Plan: Urinalysis Dipstick  Vitamin D deficiency - Plan: VITAMIN D 25 Hydroxy (Vit-D Deficiency, Fractures)  History of hyperlipidemia - Plan: Lipid panel  Muscle cramps - Plan: Ferritin, Magnesium  Paresthesias - Plan: Ferritin, Vitamin B12, RPR  Overall she appears to be doing well. Her mood is good.  She is up to date on immunizations and health maintenance. Discussed healthy lifestyle and starting to exercise.  Will check lipids, she is fasting today.  She will continue seeing OB/GYN for pap smears and breast exams. Discussed keeping up with mammograms since her mother was diagnosed in her 30s.  Discussed safety and health promotion.  History of vitamin D deficiency- will check level and start her back on vitamin D as appropriate.  Plan to check labs to look for underlying etiology for muscle cramps and paresthesias. Discussed that this has been ongoing for at least 4 years and seems to be bothering her more. If labs are normal then we will consider starting medication, amitriptyline and consider referral to neurology.  Follow up pending labs.

## 2016-03-04 NOTE — Patient Instructions (Addendum)
Make sure you are getting at least 150 minutes of some type of exercise per week. Stay well hydrated.  We will call you with lab results.   Preventative Care for Adults - Female      MAINTAIN REGULAR HEALTH EXAMS:  A routine yearly physical is a good way to check in with your primary care provider about your health and preventive screening. It is also an opportunity to share updates about your health and any concerns you have, and receive a thorough all-over exam.   Most health insurance companies pay for at least some preventative services.  Check with your health plan for specific coverages.  WHAT PREVENTATIVE SERVICES DO WOMEN NEED?  Adult women should have their weight and blood pressure checked regularly.   Women age 51 and older should have their cholesterol levels checked regularly.  Women should be screened for cervical cancer with a Pap smear and pelvic exam beginning at either age 521, or 3 years after they become sexually activity.    Breast cancer screening generally begins at age 51 with a mammogram and breast exam by your primary care provider.    Beginning at age 51 and continuing to age 51, women should be screened for colorectal cancer.  Certain people may need continued testing until age 51.  Updating vaccinations is part of preventative care.  Vaccinations help protect against diseases such as the flu.  Osteoporosis is a disease in which the bones lose minerals and strength as we age. Women ages 3765 and over should discuss this with their caregivers, as should women after menopause who have other risk factors.  Lab tests are generally done as part of preventative care to screen for anemia and blood disorders, to screen for problems with the kidneys and liver, to screen for bladder problems, to check blood sugar, and to check your cholesterol level.  Preventative services generally include counseling about diet, exercise, avoiding tobacco, drugs, excessive alcohol  consumption, and sexually transmitted infections.    GENERAL RECOMMENDATIONS FOR GOOD HEALTH:  Healthy diet:  Eat a variety of foods, including fruit, vegetables, animal or vegetable protein, such as meat, fish, chicken, and eggs, or beans, lentils, tofu, and grains, such as rice.  Drink plenty of water daily.  Decrease saturated fat in the diet, avoid lots of red meat, processed foods, sweets, fast foods, and fried foods.  Exercise:  Aerobic exercise helps maintain good heart health. At least 30-40 minutes of moderate-intensity exercise is recommended. For example, a brisk walk that increases your heart rate and breathing. This should be done on most days of the week.   Find a type of exercise or a variety of exercises that you enjoy so that it becomes a part of your daily life.  Examples are running, walking, swimming, water aerobics, and biking.  For motivation and support, explore group exercise such as aerobic class, spin class, Zumba, Yoga,or  martial arts, etc.    Set exercise goals for yourself, such as a certain weight goal, walk or run in a race such as a 5k walk/run.  Speak to your primary care provider about exercise goals.  Disease prevention:  If you smoke or chew tobacco, find out from your caregiver how to quit. It can literally save your life, no matter how long you have been a tobacco user. If you do not use tobacco, never begin.   Maintain a healthy diet and normal weight. Increased weight leads to problems with blood pressure and diabetes.  The Body Mass Index or BMI is a way of measuring how much of your body is fat. Having a BMI above 27 increases the risk of heart disease, diabetes, hypertension, stroke and other problems related to obesity. Your caregiver can help determine your BMI and based on it develop an exercise and dietary program to help you achieve or maintain this important measurement at a healthful level.  High blood pressure causes heart and blood  vessel problems.  Persistent high blood pressure should be treated with medicine if weight loss and exercise do not work.   Fat and cholesterol leaves deposits in your arteries that can block them. This causes heart disease and vessel disease elsewhere in your body.  If your cholesterol is found to be high, or if you have heart disease or certain other medical conditions, then you may need to have your cholesterol monitored frequently and be treated with medication.   Ask if you should have a cardiac stress test if your history suggests this. A stress test is a test done on a treadmill that looks for heart disease. This test can find disease prior to there being a problem.  Menopause can be associated with physical symptoms and risks. Hormone replacement therapy is available to decrease these. You should talk to your caregiver about whether starting or continuing to take hormones is right for you.   Osteoporosis is a disease in which the bones lose minerals and strength as we age. This can result in serious bone fractures. Risk of osteoporosis can be identified using a bone density scan. Women ages 47 and over should discuss this with their caregivers, as should women after menopause who have other risk factors. Ask your caregiver whether you should be taking a calcium supplement and Vitamin D, to reduce the rate of osteoporosis.   Avoid drinking alcohol in excess (more than two drinks per day).  Avoid use of street drugs. Do not share needles with anyone. Ask for professional help if you need assistance or instructions on stopping the use of alcohol, cigarettes, and/or drugs.  Brush your teeth twice a day with fluoride toothpaste, and floss once a day. Good oral hygiene prevents tooth decay and gum disease. The problems can be painful, unattractive, and can cause other health problems. Visit your dentist for a routine oral and dental check up and preventive care every 6-12 months.   Look at your skin  regularly.  Use a mirror to look at your back. Notify your caregivers of changes in moles, especially if there are changes in shapes, colors, a size larger than a pencil eraser, an irregular border, or development of new moles.  Safety:  Use seatbelts 100% of the time, whether driving or as a passenger.  Use safety devices such as hearing protection if you work in environments with loud noise or significant background noise.  Use safety glasses when doing any work that could send debris in to the eyes.  Use a helmet if you ride a bike or motorcycle.  Use appropriate safety gear for contact sports.  Talk to your caregiver about gun safety.  Use sunscreen with a SPF (or skin protection factor) of 15 or greater.  Lighter skinned people are at a greater risk of skin cancer. Don't forget to also wear sunglasses in order to protect your eyes from too much damaging sunlight. Damaging sunlight can accelerate cataract formation.   Practice safe sex. Use condoms. Condoms are used for birth control and to help reduce the  spread of sexually transmitted infections (or STIs).  Some of the STIs are gonorrhea (the clap), chlamydia, syphilis, trichomonas, herpes, HPV (human papilloma virus) and HIV (human immunodeficiency virus) which causes AIDS. The herpes, HIV and HPV are viral illnesses that have no cure. These can result in disability, cancer and death.   Keep carbon monoxide and smoke detectors in your home functioning at all times. Change the batteries every 6 months or use a model that plugs into the wall.   Vaccinations:  Stay up to date with your tetanus shots and other required immunizations. You should have a booster for tetanus every 10 years. Be sure to get your flu shot every year, since 5%-20% of the [place] population comes down with the flu. The flu vaccine changes each year, so being vaccinated once is not enough. Get your shot in the fall, before the flu season peaks.   Other vaccines to  consider:  Human Papilloma Virus or HPV causes cancer of the cervix, and other infections that can be transmitted from person to person. There is a vaccine for HPV, and females should get immunized between the ages of 58 and 53. It requires a series of 3 shots.   Pneumococcal vaccine to protect against certain types of pneumonia.  This is normally recommended for adults age 32 or older.  However, adults younger than 51 years old with certain underlying conditions such as diabetes, heart or lung disease should also receive the vaccine.  Shingles vaccine to protect against Varicella Zoster if you are older than age 88, or younger than 51 years old with certain underlying illness.  Hepatitis A vaccine to protect against a form of infection of the liver by a virus acquired from food.  Hepatitis B vaccine to protect against a form of infection of the liver by a virus acquired from blood or body fluids, particularly if you work in health care.  If you plan to travel internationally, check with your local health department for specific vaccination recommendations.  Cancer Screening:  Breast cancer screening is essential to preventive care for women. All women age 53 and older should perform a breast self-exam every month. At age 64 and older, women should have their caregiver complete a breast exam each year. Women at ages 14 and older should have a mammogram (x-ray film) of the breasts. Your caregiver can discuss how often you need mammograms.    Cervical cancer screening includes taking a Pap smear (sample of cells examined under a microscope) from the cervix (end of the uterus). It also includes testing for HPV (Human Papilloma Virus, which can cause cervical cancer). Screening and a pelvic exam should begin at age 83, or 3 years after a woman becomes sexually active. Screening should occur every year, with a Pap smear but no HPV testing, up to age 13. After age 20, you should have a Pap smear every 3  years with HPV testing, if no HPV was found previously.   Most routine colon cancer screening begins at the age of 74. On a yearly basis, doctors may provide special easy to use take-home tests to check for hidden blood in the stool. Sigmoidoscopy or colonoscopy can detect the earliest forms of colon cancer and is life saving. These tests use a small camera at the end of a tube to directly examine the colon. Speak to your caregiver about this at age 69, when routine screening begins (and is repeated every 5 years unless early forms of pre-cancerous polyps or  small growths are found).

## 2016-03-05 LAB — LIPID PANEL
CHOL/HDL RATIO: 4.6 ratio (ref <5.0)
CHOLESTEROL: 274 mg/dL — AB (ref <200)
HDL: 59 mg/dL (ref >50)
LDL Cholesterol: 176 mg/dL — ABNORMAL HIGH (ref <100)
TRIGLYCERIDES: 195 mg/dL — AB (ref <150)
VLDL: 39 mg/dL — ABNORMAL HIGH (ref <30)

## 2016-03-05 LAB — VITAMIN D 25 HYDROXY (VIT D DEFICIENCY, FRACTURES): Vit D, 25-Hydroxy: 27 ng/mL — ABNORMAL LOW (ref 30–100)

## 2016-03-05 LAB — RPR

## 2016-03-05 LAB — MAGNESIUM: Magnesium: 2.2 mg/dL (ref 1.5–2.5)

## 2016-04-19 DIAGNOSIS — H26051 Posterior subcapsular polar infantile and juvenile cataract, right eye: Secondary | ICD-10-CM | POA: Diagnosis not present

## 2016-04-25 ENCOUNTER — Encounter: Payer: Self-pay | Admitting: Family Medicine

## 2016-04-25 ENCOUNTER — Ambulatory Visit (INDEPENDENT_AMBULATORY_CARE_PROVIDER_SITE_OTHER): Payer: BLUE CROSS/BLUE SHIELD | Admitting: Family Medicine

## 2016-04-25 VITALS — BP 110/76 | HR 64 | Temp 97.6°F | Wt 156.0 lb

## 2016-04-25 DIAGNOSIS — H5789 Other specified disorders of eye and adnexa: Secondary | ICD-10-CM

## 2016-04-25 DIAGNOSIS — E039 Hypothyroidism, unspecified: Secondary | ICD-10-CM | POA: Diagnosis not present

## 2016-04-25 DIAGNOSIS — L299 Pruritus, unspecified: Secondary | ICD-10-CM

## 2016-04-25 DIAGNOSIS — F329 Major depressive disorder, single episode, unspecified: Secondary | ICD-10-CM | POA: Diagnosis not present

## 2016-04-25 DIAGNOSIS — H578 Other specified disorders of eye and adnexa: Secondary | ICD-10-CM | POA: Diagnosis not present

## 2016-04-25 DIAGNOSIS — R5383 Other fatigue: Secondary | ICD-10-CM | POA: Diagnosis not present

## 2016-04-25 DIAGNOSIS — F32A Depression, unspecified: Secondary | ICD-10-CM

## 2016-04-25 LAB — CBC WITH DIFFERENTIAL/PLATELET
Basophils Absolute: 0 cells/uL (ref 0–200)
Basophils Relative: 0 %
EOS PCT: 1 %
Eosinophils Absolute: 62 cells/uL (ref 15–500)
HEMATOCRIT: 38.6 % (ref 35.0–45.0)
HEMOGLOBIN: 13.2 g/dL (ref 11.7–15.5)
LYMPHS PCT: 38 %
Lymphs Abs: 2356 cells/uL (ref 850–3900)
MCH: 30.6 pg (ref 27.0–33.0)
MCHC: 34.2 g/dL (ref 32.0–36.0)
MCV: 89.6 fL (ref 80.0–100.0)
MPV: 10.2 fL (ref 7.5–12.5)
Monocytes Absolute: 496 cells/uL (ref 200–950)
Monocytes Relative: 8 %
NEUTROS PCT: 53 %
Neutro Abs: 3286 cells/uL (ref 1500–7800)
Platelets: 275 10*3/uL (ref 140–400)
RBC: 4.31 MIL/uL (ref 3.80–5.10)
RDW: 13.5 % (ref 11.0–15.0)
WBC: 6.2 10*3/uL (ref 4.0–10.5)

## 2016-04-25 LAB — COMPREHENSIVE METABOLIC PANEL
ALBUMIN: 4.4 g/dL (ref 3.6–5.1)
ALT: 14 U/L (ref 6–29)
AST: 17 U/L (ref 10–35)
Alkaline Phosphatase: 82 U/L (ref 33–130)
BUN: 16 mg/dL (ref 7–25)
CO2: 27 mmol/L (ref 20–31)
Calcium: 9.5 mg/dL (ref 8.6–10.4)
Chloride: 106 mmol/L (ref 98–110)
Creat: 0.79 mg/dL (ref 0.50–1.05)
Glucose, Bld: 86 mg/dL (ref 65–99)
POTASSIUM: 3.9 mmol/L (ref 3.5–5.3)
Sodium: 141 mmol/L (ref 135–146)
TOTAL PROTEIN: 7.1 g/dL (ref 6.1–8.1)
Total Bilirubin: 0.4 mg/dL (ref 0.2–1.2)

## 2016-04-25 LAB — TSH: TSH: 0.74 mIU/L

## 2016-04-25 MED ORDER — HYDROXYZINE HCL 25 MG PO TABS: 25.0000 mg | ORAL_TABLET | Freq: Three times a day (TID) | ORAL | 0 refills | Status: DC | PRN

## 2016-04-25 NOTE — Patient Instructions (Addendum)
Try the hydroxyzine for itching. This medication may make you drowsy so use caution when taking it for the first time.  Do not drive or drink alcohol with this medication.  Call and schedule an appointment with a counselor. Some numbers are below but you can go wherever you want.   We will call you with lab results.   You can call to schedule your appointment with the Counselor. A couple offices are listed below for you to call.   [facility]  [address] [place], [place] [address] Phone: [phone]  [facility]  [address], [place], [place] [address]  Phone: [phone]  [facility] [address] [place], [place] [address]  Phone: [phone]      Hydroxyzine capsules or tablets What is this medicine? HYDROXYZINE (hye DROX i zeen) is an antihistamine. This medicine is used to treat allergy symptoms. It is also used to treat anxiety and tension. This medicine can be used with other medicines to induce sleep before surgery. This medicine may be used for other purposes; ask your health care provider or pharmacist if you have questions. COMMON BRAND NAME(S): ANX, Atarax, Rezine, Vistaril What should I tell my health care provider before I take this medicine? They need to know if you have any of these conditions: -any chronic illness -difficulty passing urine -glaucoma -heart disease -kidney disease -liver disease -lung disease -an unusual or allergic reaction to hydroxyzine, cetirizine, other medicines, foods, dyes, or preservatives -pregnant or trying to get pregnant -breast-feeding How should I use this medicine? Take this medicine by mouth with a full glass of water. Follow the directions on the prescription label. You may take this medicine with food or on an empty stomach. Take your medicine at regular intervals. Do not take your medicine more often than  directed. Talk to your pediatrician regarding the use of this medicine in children. Special care may be needed. While this drug may be prescribed for children as young as 65 years of age for selected conditions, precautions do apply. Patients over 39 years old may have a stronger reaction and need a smaller dose. Overdosage: If you think you have taken too much of this medicine contact a poison control center or emergency room at once. NOTE: This medicine is only for you. Do not share this medicine with others. What if I miss a dose? If you miss a dose, take it as soon as you can. If it is almost time for your next dose, take only that dose. Do not take double or extra doses. What may interact with this medicine? -alcohol -barbiturate medicines for sleep or seizures -medicines for colds, allergies -medicines for depression, anxiety, or emotional disturbances -medicines for pain -medicines for sleep -muscle relaxants This list may not describe all possible interactions. Give your health care provider a list of all the medicines, herbs, non-prescription drugs, or dietary supplements you use. Also tell them if you smoke, drink alcohol, or use illegal drugs. Some items may interact with your medicine. What should I watch for while using this medicine? Tell your doctor or health care professional if your symptoms do not improve. You may get drowsy or dizzy. Do not drive, use machinery, or do anything that needs mental alertness until you know how this medicine affects you. Do not stand or sit up quickly, especially if you are an older patient. This reduces the risk of dizzy or  fainting spells. Alcohol may interfere with the effect of this medicine. Avoid alcoholic drinks. Your mouth may get dry. Chewing sugarless gum or sucking hard candy, and drinking plenty of water may help. Contact your doctor if the problem does not go away or is severe. This medicine may cause dry eyes and blurred vision. If you  wear contact lenses you may feel some discomfort. Lubricating drops may help. See your eye doctor if the problem does not go away or is severe. If you are receiving skin tests for allergies, tell your doctor you are using this medicine. What side effects may I notice from receiving this medicine? Side effects that you should report to your doctor or health care professional as soon as possible: -fast or irregular heartbeat -difficulty passing urine -seizures -slurred speech or confusion -tremor Side effects that usually do not require medical attention (report to your doctor or health care professional if they continue or are bothersome): -constipation -drowsiness -fatigue -headache -stomach upset This list may not describe all possible side effects. Call your doctor for medical advice about side effects. You may report side effects to FDA at 1-800-FDA-1088. Where should I keep my medicine? Keep out of the reach of children. Store at room temperature between 15 and 30 degrees C (59 and 86 degrees F). Keep container tightly closed. Throw away any unused medicine after the expiration date. NOTE: This sheet is a summary. It may not cover all possible information. If you have questions about this medicine, talk to your doctor, pharmacist, or health care provider.  2018 Elsevier/Gold Standard (2007-06-05 14:50:59)

## 2016-04-25 NOTE — Progress Notes (Signed)
Subjective:    Patient ID: Stacey BeaversMercedes Arscott, female    DOB: 09/23/1965, 51 y.o.   MRN: [id number]  HPI Chief Complaint  Patient presents with  . swollen eyes.    eyes are swollen since monday. thinks its allergies. having itching down legs more at night.   She is here with complaints of her upper eye lids being swollen for the past the 2 days.  States she has been feeling itchy at night mostly on her upper legs since last week after wearing new pajamas. She continues to having some itching. Also reports feeling tired and sleepy since the symptoms started.  States she had fish the night before her eyes started swelling. She was also taking a new vitamin prior to this starting.   Reports also having a loss of appetite, nausea and 1 episode of vomiting yesterday. She took a bonine today and no more vomiting or stomach upset.   Denies fever, chills, unexplained weight loss, dizziness, vision changes, ear pain, sore throat chest pain, palpitations, shortness of breath, cough, arthralgias, swollen or painful joints, abdominal pain, diarrhea or constipation. No LE edema. No numbness, tingling or weakness.     [facility] 04/25/2016 03/04/2016 01/02/2015 05/16/2014 04/21/2014  Decreased Interest 1 0 0 0 1  Down, Depressed, Hopeless 2 0 0 0 1  PHQ - 2 Score 3 0 0 0 2  Altered sleeping 3 - - - 0  Tired, decreased energy 3 - - - 1  Change in appetite 3 - - - 1  Feeling bad or failure about yourself  1 - - - 1  Trouble concentrating 0 - - - 0  Moving slowly or fidgety/restless 0 - - - 0  Suicidal thoughts 0 - - - 0  PHQ-9 Score 13 - - - 5      Review of Systems Pertinent positives and negatives in the history of present illness.     Objective:   Physical Exam  Constitutional: She appears well-developed and well-nourished. No distress.  HENT:  Right Ear: Tympanic membrane and ear canal normal.  Left Ear: Tympanic membrane and ear canal normal.  Nose: Nose normal. Right  sinus exhibits no maxillary sinus tenderness and no frontal sinus tenderness. Left sinus exhibits no maxillary sinus tenderness and no frontal sinus tenderness.  Mouth/Throat: Oropharynx is clear and moist.  Eyes: Conjunctivae and EOM are normal. Pupils are equal, round, and reactive to light.  Upper lid edema, left worse than right   Neck: Trachea normal and normal range of motion. Neck supple. No thyromegaly present.  Cardiovascular: Normal rate, regular rhythm, normal heart sounds and normal pulses.  Exam reveals no gallop and no friction rub.   No murmur heard. No LE edema  Pulmonary/Chest: Effort normal and breath sounds normal.  Lymphadenopathy:    She has no cervical adenopathy.       Right: No supraclavicular adenopathy present.       Left: No supraclavicular adenopathy present.  Neurological: She is alert. She has normal strength and normal reflexes. Gait normal.  Skin: Skin is warm and dry. No rash noted. She is not diaphoretic. No pallor.  Psychiatric: Her speech is normal and behavior is normal. Thought content normal. Cognition and memory are normal. She exhibits a depressed mood.   BP 110/76   Pulse 64   Temp 97.6 F (36.4 C) (Oral)   Wt 156 lb (70.8 kg)   BMI 24.80 kg/m       Assessment &  Plan:  Eyes swollen  Hypothyroidism, unspecified type - Plan: TSH  Itching - Plan: hydrOXYzine (ATARAX/VISTARIL) 25 MG tablet, CBC with Differential/Platelet, Comprehensive metabolic panel  Fatigue, unspecified type - Plan: CBC with Differential/Platelet, Comprehensive metabolic panel, TSH  Depression, unspecified depression type - Plan: Ambulatory referral to Psychology  She appears to have had an allergic reaction to something but unclear etiology. She states she cannot take steroids.  Will treat her with hydroxyzine for itching. Discussed sedating nature of medication.  Will check TSH and adjust medication as needed.  Plan to check labs for fatigue etiology.  I question  whether depression is playing a role in her fatigue and sleepiness. Plan to refer her to counseling at [facility] or wherever she chooses. She is aware that she needs to call and schedule appointment. She cannot recall which antidepressant she took in the past. She stopped taking it because she was feeling much better.  Will follow up pending labs. Would like to see her back in 1 month to discuss depression.

## 2016-04-26 ENCOUNTER — Encounter: Payer: Self-pay | Admitting: Internal Medicine

## 2016-04-26 ENCOUNTER — Telehealth: Payer: Self-pay

## 2016-04-26 NOTE — Telephone Encounter (Signed)
Pt called to let you know that she was taking bupropion 150mg  in the past for depression. Pt states this medication did work for her depression. She was told to call you back and let you know. [phone]

## 2016-05-01 ENCOUNTER — Encounter: Payer: Self-pay | Admitting: Internal Medicine

## 2016-05-14 DIAGNOSIS — D2339 Other benign neoplasm of skin of other parts of face: Secondary | ICD-10-CM | POA: Diagnosis not present

## 2016-05-14 DIAGNOSIS — L910 Hypertrophic scar: Secondary | ICD-10-CM | POA: Diagnosis not present

## 2016-05-14 DIAGNOSIS — D2239 Melanocytic nevi of other parts of face: Secondary | ICD-10-CM | POA: Diagnosis not present

## 2016-05-20 ENCOUNTER — Other Ambulatory Visit: Payer: Self-pay | Admitting: Family Medicine

## 2016-05-20 NOTE — Telephone Encounter (Signed)
Ok to refill for 4 months

## 2016-05-20 NOTE — Telephone Encounter (Signed)
Has established care with Hetty Blend, NP-C; will forward request to her.

## 2016-05-29 ENCOUNTER — Encounter: Payer: Self-pay | Admitting: Family Medicine

## 2016-05-29 ENCOUNTER — Ambulatory Visit (INDEPENDENT_AMBULATORY_CARE_PROVIDER_SITE_OTHER): Payer: BLUE CROSS/BLUE SHIELD | Admitting: Family Medicine

## 2016-05-29 VITALS — BP 120/80 | HR 67 | Wt 152.0 lb

## 2016-05-29 DIAGNOSIS — E7800 Pure hypercholesterolemia, unspecified: Secondary | ICD-10-CM

## 2016-05-29 DIAGNOSIS — L299 Pruritus, unspecified: Secondary | ICD-10-CM | POA: Diagnosis not present

## 2016-05-29 DIAGNOSIS — F329 Major depressive disorder, single episode, unspecified: Secondary | ICD-10-CM | POA: Diagnosis not present

## 2016-05-29 DIAGNOSIS — E559 Vitamin D deficiency, unspecified: Secondary | ICD-10-CM

## 2016-05-29 DIAGNOSIS — E039 Hypothyroidism, unspecified: Secondary | ICD-10-CM | POA: Insufficient documentation

## 2016-05-29 DIAGNOSIS — F32A Depression, unspecified: Secondary | ICD-10-CM

## 2016-05-29 NOTE — Progress Notes (Signed)
   Subjective:    Patient ID: Stacey Gardner, female    DOB: 20-Apr-1965, 51 y.o.   MRN: [id number]  HPI Chief Complaint  Patient presents with  . follow-up    1 month follow-up   She is here for a 1 month follow up on depression and itching. Itching has resolved.  States she has an appointment with Dr. Felipa Furnace on May 29th or depression. States she is feeling better. No longer feels sad. Taking walks outside and taking time to do things she enjoys.  No concerns or complaints today.  Has appointment with OB/GYN.   LDL was elevated and she has been making lifestyle modifications. Will need to repeat lipids in May.  Vitamin D was low and will need this repeated. She is taking a supplement.   Reviewed allergies, medications, past medical, surgical, and social history.   Review of Systems Pertinent positives and negatives in the history of present illness.     Objective:   Physical Exam BP 120/80   Pulse 67   Wt 152 lb (68.9 kg)   BMI 24.17 kg/m  Alert and oriented and in no acute distress. Not otherwise examined.       Assessment & Plan:  Depression, unspecified depression type  Itching  Vitamin D deficiency - Plan: VITAMIN D 25 Hydroxy (Vit-D Deficiency, Fractures)  Elevated LDL cholesterol level - Plan: Lipid panel  She is doing well and no longer having issues with depression. Has a positive outlook and is looking forward to her appointment with the psychologist. Does not want medication at this point.  She is no longer itching or having swelling of the face, this has resolved.  Will have her follow up for a lab visit to recheck lipids and vitamin D in May and in September for med check.

## 2016-06-17 ENCOUNTER — Other Ambulatory Visit: Payer: BLUE CROSS/BLUE SHIELD

## 2016-06-17 DIAGNOSIS — E7800 Pure hypercholesterolemia, unspecified: Secondary | ICD-10-CM

## 2016-06-17 DIAGNOSIS — E559 Vitamin D deficiency, unspecified: Secondary | ICD-10-CM | POA: Diagnosis not present

## 2016-06-18 ENCOUNTER — Other Ambulatory Visit: Payer: BLUE CROSS/BLUE SHIELD

## 2016-06-18 LAB — VITAMIN D 25 HYDROXY (VIT D DEFICIENCY, FRACTURES): Vit D, 25-Hydroxy: 25 ng/mL — ABNORMAL LOW (ref 30–100)

## 2016-06-18 LAB — LIPID PANEL
CHOL/HDL RATIO: 5.1 ratio — AB (ref <5.0)
CHOLESTEROL: 198 mg/dL (ref <200)
HDL: 39 mg/dL — ABNORMAL LOW (ref >50)
LDL CALC: 120 mg/dL — AB (ref <100)
Triglycerides: 195 mg/dL — ABNORMAL HIGH (ref <150)
VLDL: 39 mg/dL — AB (ref <30)

## 2016-07-02 ENCOUNTER — Ambulatory Visit (INDEPENDENT_AMBULATORY_CARE_PROVIDER_SITE_OTHER): Payer: BLUE CROSS/BLUE SHIELD | Admitting: Psychology

## 2016-07-02 DIAGNOSIS — F331 Major depressive disorder, recurrent, moderate: Secondary | ICD-10-CM

## 2016-07-18 ENCOUNTER — Ambulatory Visit: Payer: BLUE CROSS/BLUE SHIELD | Admitting: Psychology

## 2016-08-16 ENCOUNTER — Other Ambulatory Visit: Payer: Self-pay | Admitting: Family Medicine

## 2016-08-16 DIAGNOSIS — Z01419 Encounter for gynecological examination (general) (routine) without abnormal findings: Secondary | ICD-10-CM | POA: Diagnosis not present

## 2016-08-16 DIAGNOSIS — Z1231 Encounter for screening mammogram for malignant neoplasm of breast: Secondary | ICD-10-CM | POA: Diagnosis not present

## 2016-08-16 DIAGNOSIS — Z6824 Body mass index (BMI) 24.0-24.9, adult: Secondary | ICD-10-CM | POA: Diagnosis not present

## 2016-10-11 ENCOUNTER — Encounter: Payer: Self-pay | Admitting: Family Medicine

## 2016-10-11 ENCOUNTER — Ambulatory Visit (INDEPENDENT_AMBULATORY_CARE_PROVIDER_SITE_OTHER): Payer: BLUE CROSS/BLUE SHIELD | Admitting: Family Medicine

## 2016-10-11 VITALS — BP 120/80 | HR 65 | Temp 97.8°F | Resp 16 | Wt 150.2 lb

## 2016-10-11 DIAGNOSIS — J209 Acute bronchitis, unspecified: Secondary | ICD-10-CM

## 2016-10-11 DIAGNOSIS — R05 Cough: Secondary | ICD-10-CM | POA: Diagnosis not present

## 2016-10-11 DIAGNOSIS — R059 Cough, unspecified: Secondary | ICD-10-CM

## 2016-10-11 MED ORDER — AZITHROMYCIN 250 MG PO TABS: ORAL_TABLET | ORAL | 0 refills | Status: DC

## 2016-10-11 MED ORDER — BENZONATATE 200 MG PO CAPS: 200.0000 mg | ORAL_CAPSULE | Freq: Two times a day (BID) | ORAL | 0 refills | Status: DC | PRN

## 2016-10-11 NOTE — Patient Instructions (Addendum)
Start the antibiotic today. You can use the Tessalon cough capsules and take Mucinex as needed for cough.   Drink plenty of fluids and use cough lozenges.   If your symptoms get worse or if you develop any new symptoms let me know.   You can use the albuterol inhaler that you have if this helps.   If you are not back to baseline in 10 days call or return for a visit.

## 2016-10-11 NOTE — Progress Notes (Signed)
Subjective:  Stacey Gardner is a 51 y.o. female who presents for possible bronchitis.  Symptoms include a 1 week history of sinus pressure, nasal congestion, headache, fever, initially, body aches, and cough.    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 She completes this today. Reports some improvement of symptoms but the cough is persistent.   Treatment to date: antibiotics and albuterol, symbicort (her father's).  positive sick contacts in [place].   She does not smoke.   She does not have a history of bronchitis.   No other aggravating or relieving factors.  No other c/o.  The following portions of the patient's history were reviewed and updated as appropriate: allergies, current medications, past family history, past medical history, past social history, past surgical history and problem list.  ROS as in subjective  Past Medical History:  Diagnosis Date  . Allergy   . Anemia   . Depression   . Hypothyroid   . Vitamin D deficiency      Objective: Vital signs reviewed BP 120/80   Pulse 65   Temp 97.8 F (36.6 C) (Oral)   Resp 16   Wt 150 lb 3.2 oz (68.1 kg)   SpO2 98%   BMI 23.88 kg/m    General appearance: Alert, WD/WN, no distress, ill appearing                             Skin: warm, no rash, no diaphoresis                           Head: no sinus tenderness                            Eyes: conjunctiva normal, corneas clear, PERRLA                            Ears: pearly TMs, external ear canals normal                          Nose: septum midline, turbinates swollen, with erythema and no discharge             Mouth/throat: MMM, tongue normal, mild pharyngeal erythema                           Neck: supple, no adenopathy, no thyromegaly, nontender                          Heart: RRR, normal S1, S2, no murmurs                         Lungs: +bronchial  breath sounds, no rhonchi, no wheezes, no rales                Extremities: no edema, nontender      Assessment: Acute bronchitis, unspecified organism - Plan: azithromycin (ZITHROMAX) 250 MG tablet, benzonatate (TESSALON) 200 MG capsule  Cough   Plan:  Medication orders today include: Z-pak and Tessalon. She reports an allergy to steroids in the past.  She has an albuterol inhaler at home, given to her by  her father in [place].  She will stop the symbicort that her father gave her.Discussed diagnosis and treatment of bronchitis.  Suggested symptomatic OTC remedies for cough and congestion.  Tylenol or Ibuprofen OTC for fever and malaise.  Call or return if worse or not back to baseline after completing the antibiotic.

## 2016-10-24 ENCOUNTER — Encounter: Payer: Self-pay | Admitting: Family Medicine

## 2016-10-24 ENCOUNTER — Ambulatory Visit (INDEPENDENT_AMBULATORY_CARE_PROVIDER_SITE_OTHER): Payer: BLUE CROSS/BLUE SHIELD | Admitting: Family Medicine

## 2016-10-24 VITALS — BP 110/70 | HR 78 | Resp 16 | Wt 146.8 lb

## 2016-10-24 DIAGNOSIS — E039 Hypothyroidism, unspecified: Secondary | ICD-10-CM | POA: Diagnosis not present

## 2016-10-24 DIAGNOSIS — Z889 Allergy status to unspecified drugs, medicaments and biological substances status: Secondary | ICD-10-CM | POA: Diagnosis not present

## 2016-10-24 DIAGNOSIS — R05 Cough: Secondary | ICD-10-CM

## 2016-10-24 DIAGNOSIS — E559 Vitamin D deficiency, unspecified: Secondary | ICD-10-CM

## 2016-10-24 DIAGNOSIS — Z79899 Other long term (current) drug therapy: Secondary | ICD-10-CM

## 2016-10-24 DIAGNOSIS — R058 Other specified cough: Secondary | ICD-10-CM

## 2016-10-24 LAB — CBC WITH DIFFERENTIAL/PLATELET
BASOS ABS: 63 {cells}/uL (ref 0–200)
Basophils Relative: 0.8 %
EOS ABS: 111 {cells}/uL (ref 15–500)
Eosinophils Relative: 1.4 %
HEMATOCRIT: 40 % (ref 35.0–45.0)
HEMOGLOBIN: 13.6 g/dL (ref 11.7–15.5)
LYMPHS ABS: 2449 {cells}/uL (ref 850–3900)
MCH: 29.9 pg (ref 27.0–33.0)
MCHC: 34 g/dL (ref 32.0–36.0)
MCV: 87.9 fL (ref 80.0–100.0)
MPV: 11.3 fL (ref 7.5–12.5)
Monocytes Relative: 8 %
NEUTROS ABS: 4645 {cells}/uL (ref 1500–7800)
NEUTROS PCT: 58.8 %
Platelets: 286 10*3/uL (ref 140–400)
RBC: 4.55 10*6/uL (ref 3.80–5.10)
RDW: 13 % (ref 11.0–15.0)
Total Lymphocyte: 31 %
WBC: 7.9 10*3/uL (ref 3.8–10.8)
WBCMIX: 632 {cells}/uL (ref 200–950)

## 2016-10-24 LAB — TSH: TSH: 1.77 mIU/L

## 2016-10-24 MED ORDER — AMOXICILLIN-POT CLAVULANATE 875-125 MG PO TABS: 1.0000 | ORAL_TABLET | Freq: Two times a day (BID) | ORAL | 0 refills | Status: DC

## 2016-10-24 MED ORDER — PREDNISONE 10 MG (21) PO TBPK: ORAL_TABLET | Freq: Every day | ORAL | 0 refills | Status: DC

## 2016-10-24 NOTE — Progress Notes (Signed)
   Subjective:    Patient ID: Stacey Gardner, female    DOB: November 30, 1965, 51 y.o.   MRN: [id number]  HPI Chief Complaint  Patient presents with  . med check    med check. continues to cough   She is here for a med check. States she is taking vitamin D and levothyroxine daily without any concerns.   Complains of continuous cough and this has now been ongoing for at least 3 weeks. Her cough started with an URI while in [place]. States she is 30% improved after taking Z-pak. States Tessalon perles helped temporarily. She refuses to use albuterol due to heart racing and feeling jittering afterwards.   She does not nasal drainage and post nasal drip as well. Cough is worse at night.  She thinks she has allergies to pollen and other unknown triggers. She has apparently been prescribed Singulair in the past but only takes this sporadically. She is not taking a daily antihistamine and states Claritin in the past has not helped.   Cough does not worsen with eating.  Denies history of asthma, bronchitis or pneumonia.   Never smoked.   Denies fever, chills, dizziness, chest pain, palpitations, shortness of breath, abdominal pain, N/V/D, urinary symptoms, LE edema.    Reviewed allergies, medications, past medical, surgical, family, and social history.    Review of Systems Pertinent positives and negatives in the history of present illness.     Objective:   Physical Exam BP 110/70   Pulse 78   Resp 16   Wt 146 lb 12.8 oz (66.6 kg)   SpO2 98%   PF 220 L/min   BMI 23.34 kg/m  Alert and in no distress. No sinus tenderness. Nares with erythema, mild edema and clear drainage. Tympanic membranes and canals are normal. Pharyngeal area is mildly erythematous without edema or exudate. Neck is supple without adenopathy or thyromegaly. Cardiac exam shows a regular sinus rhythm without murmurs or gallops. Lungs are clear to auscultation. Extremities without edema.       Assessment & Plan:    Cough present for greater than 3 weeks - Plan: Spirometry with graph, DG Chest 2 View, CBC with Differential/Platelet, amoxicillin-clavulanate (AUGMENTIN) 875-125 MG tablet, predniSONE (STERAPRED UNI-PAK 21 TAB) 10 MG (21) TBPK tablet  Hx of seasonal allergies  Hypothyroidism, unspecified type - Plan: TSH  Vitamin D deficiency  Medication management - Plan: TSH  PFT is abnormal and does show restriction. Peak flow is 220. Suspect abnormal PFT is related to acute symptoms and this may need to be rechecked if her symptoms do not resolve after further treatment. Chest XR ordered.  Plan to treat her with Augmentin and prednisone dose pack. Explained that her allergies to topical steroids in the past are most likely related to the carrier median and not the steroids themselves. She has not tried oral steroids in the past and is willing to try them now.  Discussed potential side effects including flushing, insomnia, euphoria.  She may start taking an oral antihistamine as she does appear to have underlying allergies.  She reports taking levothyroxine on empty stomach. We will check TSH and adjust dose if needed.  Continue taking daily vitamin D. We will recheck her vitamin D level at her CPE.

## 2016-10-24 NOTE — Patient Instructions (Signed)
Take the antibiotic and steroid dose pack as prescribed.  Stay well hydrated.  You may start taking an oral antihistamine such as Xyzal, Zytrec, or Allegra.   We will call you with your lab and XR results.   Follow up with me if you are not 100% back to baseline.

## 2016-10-25 ENCOUNTER — Ambulatory Visit
Admission: RE | Admit: 2016-10-25 | Discharge: 2016-10-25 | Disposition: A | Discharge status: Home / Self Care | Payer: Self-pay | Source: Ambulatory Visit | Attending: Family Medicine | Admitting: Family Medicine

## 2016-10-25 DIAGNOSIS — R05 Cough: Secondary | ICD-10-CM

## 2016-10-25 DIAGNOSIS — R058 Other specified cough: Secondary | ICD-10-CM

## 2016-11-04 ENCOUNTER — Telehealth: Payer: Self-pay | Admitting: Family Medicine

## 2016-11-04 NOTE — Telephone Encounter (Signed)
Did she take the course of steroids?  What percentage better is she? Is she taking an antihistamine such as Claritin, Allegra, Xyzal, or Zyrtec? If not I recommend that she try this. Once daily.  I recommend that we also try her on an acid reducer. Omeprazole, Prilosec, Nexium are some examples.  If her symptoms are still not improving on these medications in the next 2-3 weeks then have her come back in to see me.

## 2016-11-04 NOTE — Telephone Encounter (Signed)
Pt is only about 50% better. Still coughing up phelgm and having some chest tightness. Pt will get some antihistamine and acid reducer meds and then let us know in the next 2-3 weeks if not better and at that time come back in to be reevaluated

## 2016-11-04 NOTE — Telephone Encounter (Signed)
Pt called & states just finished antibiotics yesterday, still coughing, little better, chest feels tight, trouble breathing at times, feels SOB, has attacks.  Wants to know what you recommend, please call pt back.

## 2016-11-04 NOTE — Telephone Encounter (Signed)
Left message for pt to call me back 

## 2017-03-20 ENCOUNTER — Telehealth: Payer: Self-pay | Admitting: Family Medicine

## 2017-03-20 MED ORDER — LEVOTHYROXINE SODIUM 88 MCG PO TABS: ORAL_TABLET | ORAL | 0 refills | Status: DC

## 2017-03-20 NOTE — Telephone Encounter (Signed)
Refilled med for 3 months but called and left VM that pt needs to come back in to be seen before anymore refills can be given

## 2017-03-20 NOTE — Telephone Encounter (Signed)
Ok to refill for 3 months and we will need to check her thyroid function before giving more refills.

## 2017-03-20 NOTE — Telephone Encounter (Signed)
Pt's husband called for refills of levothyroxine. He did not mention a manufacturer change however this is the medication involved. Please send to [facility]. Pt can be reached at [phone].

## 2017-03-31 DIAGNOSIS — H25041 Posterior subcapsular polar age-related cataract, right eye: Secondary | ICD-10-CM | POA: Diagnosis not present

## 2017-04-15 DIAGNOSIS — D229 Melanocytic nevi, unspecified: Secondary | ICD-10-CM | POA: Diagnosis not present

## 2017-04-15 DIAGNOSIS — L709 Acne, unspecified: Secondary | ICD-10-CM | POA: Diagnosis not present

## 2017-04-15 DIAGNOSIS — L910 Hypertrophic scar: Secondary | ICD-10-CM | POA: Diagnosis not present

## 2017-06-01 ENCOUNTER — Other Ambulatory Visit: Payer: Self-pay | Admitting: Family Medicine

## 2017-06-15 ENCOUNTER — Other Ambulatory Visit: Payer: Self-pay | Admitting: Family Medicine

## 2017-08-04 ENCOUNTER — Ambulatory Visit: Payer: BLUE CROSS/BLUE SHIELD | Admitting: Family Medicine

## 2017-08-04 ENCOUNTER — Encounter: Payer: Self-pay | Admitting: Family Medicine

## 2017-08-04 VITALS — BP 110/70 | HR 74 | Wt 153.8 lb

## 2017-08-04 DIAGNOSIS — Z79899 Other long term (current) drug therapy: Secondary | ICD-10-CM | POA: Diagnosis not present

## 2017-08-04 DIAGNOSIS — E039 Hypothyroidism, unspecified: Secondary | ICD-10-CM | POA: Diagnosis not present

## 2017-08-04 DIAGNOSIS — M25541 Pain in joints of right hand: Secondary | ICD-10-CM | POA: Diagnosis not present

## 2017-08-04 NOTE — Progress Notes (Signed)
   Subjective:    Patient ID: Stacey Gardner, female    DOB: 10/15/1965, 52 y.o.   MRN: [id number]  HPI Chief Complaint  Patient presents with  . multiple issue    last thursday having trouble closing hand but feeling better today, took otc, some cracking some in finger.  needs refill on TSH med as well   She is here with complaints of right hand pain and ring finger getting "stuck" intermittently for the past 4-5 days. States she has to move it herself and her 4th finger is sticking and popping. No injury. Denies history of similar pain. No numbness, tingling or weakness.  No other joints involved. Denies redness, edema.  She has been wearing a splint on her right hand at night and this has helped. She has tried Aspirin for pain with some relief.   She would also like to follow up on hypothyroidism as well. Last TSH was 1.77 on 10/24/2016.   Reports good daily compliance with levothyroxine 88 mcg. Takes this on an empty stomach.   She is traveling to [place] soon for 2 weeks to see her family.   Denies fever, chills, dizziness, chest pain, palpitations, shortness of breath, abdominal pain, N/V/D.   Reviewed allergies, medications, past medical, surgical, family, and social history.   Review of Systems Pertinent positives and negatives in the history of present illness.     Objective:   Physical Exam BP 110/70   Pulse 74   Wt 153 lb 12.8 oz (69.8 kg)   BMI 24.45 kg/m   Alert and oriented and in no acute distress. Right wrist normal.  Right hand 4th MCP joint with tenderness, no erythema, edema. Normal sensation and motor function. 4th digit is popping with flexion and extension, no locking. Symmetric grip strength. RUE is neurovascularly intact.       Assessment & Plan:  Pain in joint of right hand - Plan: DG Hand Complete Right  Hypothyroidism, unspecified type - Plan: TSH  Medication management - Plan: TSH  We will try conservative management with NSAIDs, ice,  and splint since she has this already. XR ordered. Follow up pending results. We discussed the option of sending her to a hand specialist and will re-evaluate this after getting XR.  Check TSH and refill medication as appropriate.  She is aware that she is overdue for a fasting CPE and will schedule this.

## 2017-08-04 NOTE — Patient Instructions (Signed)
Try taking ibuprofen 400 or 600 mg three times daily with food. You may try ice for pain relief also.  We will call you with your XR result and refer to hand specialist if needed.

## 2017-08-05 ENCOUNTER — Other Ambulatory Visit: Payer: Self-pay | Admitting: Family Medicine

## 2017-08-05 LAB — TSH: TSH: 0.561 u[IU]/mL (ref 0.450–4.500)

## 2017-08-05 MED ORDER — LEVOTHYROXINE SODIUM 88 MCG PO TABS: 88.0000 ug | ORAL_TABLET | Freq: Every day | ORAL | 1 refills | Status: DC

## 2017-08-06 ENCOUNTER — Ambulatory Visit
Admission: RE | Admit: 2017-08-06 | Discharge: 2017-08-06 | Disposition: A | Discharge status: Home / Self Care | Payer: Self-pay | Source: Ambulatory Visit | Attending: Family Medicine | Admitting: Family Medicine

## 2017-08-06 DIAGNOSIS — M25541 Pain in joints of right hand: Secondary | ICD-10-CM

## 2017-08-06 DIAGNOSIS — M79641 Pain in right hand: Secondary | ICD-10-CM | POA: Diagnosis not present

## 2017-08-13 ENCOUNTER — Telehealth: Payer: Self-pay | Admitting: Internal Medicine

## 2017-08-13 NOTE — Telephone Encounter (Signed)
Pt's husband called and states that pt needs to go to a hand specialist as patient is having numbness in hand. [facility]

## 2017-08-20 DIAGNOSIS — M65341 Trigger finger, right ring finger: Secondary | ICD-10-CM | POA: Diagnosis not present

## 2017-08-21 DIAGNOSIS — Z01419 Encounter for gynecological examination (general) (routine) without abnormal findings: Secondary | ICD-10-CM | POA: Diagnosis not present

## 2017-08-21 DIAGNOSIS — Z1231 Encounter for screening mammogram for malignant neoplasm of breast: Secondary | ICD-10-CM | POA: Diagnosis not present

## 2017-08-21 DIAGNOSIS — Z6824 Body mass index (BMI) 24.0-24.9, adult: Secondary | ICD-10-CM | POA: Diagnosis not present

## 2017-08-21 LAB — HM MAMMOGRAPHY

## 2017-09-04 ENCOUNTER — Encounter: Payer: Self-pay | Admitting: Family Medicine

## 2017-09-04 ENCOUNTER — Ambulatory Visit: Payer: BLUE CROSS/BLUE SHIELD | Admitting: Family Medicine

## 2017-09-04 VITALS — BP 110/70 | HR 61 | Ht 66.5 in | Wt 149.4 lb

## 2017-09-04 DIAGNOSIS — Z114 Encounter for screening for human immunodeficiency virus [HIV]: Secondary | ICD-10-CM

## 2017-09-04 DIAGNOSIS — E039 Hypothyroidism, unspecified: Secondary | ICD-10-CM

## 2017-09-04 DIAGNOSIS — Z0000 Encounter for general adult medical examination without abnormal findings: Secondary | ICD-10-CM

## 2017-09-04 DIAGNOSIS — E782 Mixed hyperlipidemia: Secondary | ICD-10-CM

## 2017-09-04 DIAGNOSIS — E559 Vitamin D deficiency, unspecified: Secondary | ICD-10-CM

## 2017-09-04 DIAGNOSIS — R202 Paresthesia of skin: Secondary | ICD-10-CM | POA: Diagnosis not present

## 2017-09-04 DIAGNOSIS — Z79899 Other long term (current) drug therapy: Secondary | ICD-10-CM

## 2017-09-04 DIAGNOSIS — R200 Anesthesia of skin: Secondary | ICD-10-CM

## 2017-09-04 DIAGNOSIS — R5383 Other fatigue: Secondary | ICD-10-CM

## 2017-09-04 LAB — POCT URINALYSIS DIP (PROADVANTAGE DEVICE)
Bilirubin, UA: NEGATIVE
Blood, UA: NEGATIVE
GLUCOSE UA: NEGATIVE mg/dL
Ketones, POC UA: NEGATIVE mg/dL
LEUKOCYTES UA: NEGATIVE
NITRITE UA: NEGATIVE
Protein Ur, POC: NEGATIVE mg/dL
Specific Gravity, Urine: 1.03
UUROB: NEGATIVE
pH, UA: 6 (ref 5.0–8.0)

## 2017-09-04 NOTE — Patient Instructions (Addendum)
Call and check with insurance company regarding Shingrix vaccine.   You will receive a call to schedule with neurology regarding the sensations in your right arm, hand and leg.   Continue on your current medications.   We will call you with your lab results.   Preventative Care for Adults - Female      MAINTAIN REGULAR HEALTH EXAMS:  A routine yearly physical is a good way to check in with your primary care provider about your health and preventive screening. It is also an opportunity to share updates about your health and any concerns you have, and receive a thorough all-over exam.   Most health insurance companies pay for at least some preventative services.  Check with your health plan for specific coverages.  WHAT PREVENTATIVE SERVICES DO WOMEN NEED?  Adult women should have their weight and blood pressure checked regularly.   Women age 52 and older should have their cholesterol levels checked regularly.  Women should be screened for cervical cancer with a Pap smear and pelvic exam beginning at either age 52, or 3 years after they become sexually activity.    Breast cancer screening generally begins at age 740 with a mammogram and breast exam by your primary care provider.    Beginning at age 52 and continuing to age 52, women should be screened for colorectal cancer.  Certain people may need continued testing until age 52.  Updating vaccinations is part of preventative care.  Vaccinations help protect against diseases such as the flu.  Osteoporosis is a disease in which the bones lose minerals and strength as we age. Women ages 3365 and over should discuss this with their caregivers, as should women after menopause who have other risk factors.  Lab tests are generally done as part of preventative care to screen for anemia and blood disorders, to screen for problems with the kidneys and liver, to screen for bladder problems, to check blood sugar, and to check your cholesterol  level.  Preventative services generally include counseling about diet, exercise, avoiding tobacco, drugs, excessive alcohol consumption, and sexually transmitted infections.    GENERAL RECOMMENDATIONS FOR GOOD HEALTH:  Healthy diet:  Eat a variety of foods, including fruit, vegetables, animal or vegetable protein, such as meat, fish, chicken, and eggs, or beans, lentils, tofu, and grains, such as rice.  Drink plenty of water daily.  Decrease saturated fat in the diet, avoid lots of red meat, processed foods, sweets, fast foods, and fried foods.  Exercise:  Aerobic exercise helps maintain good heart health. At least 30-40 minutes of moderate-intensity exercise is recommended. For example, a brisk walk that increases your heart rate and breathing. This should be done on most days of the week.   Find a type of exercise or a variety of exercises that you enjoy so that it becomes a part of your daily life.  Examples are running, walking, swimming, water aerobics, and biking.  For motivation and support, explore group exercise such as aerobic class, spin class, Zumba, Yoga,or  martial arts, etc.    Set exercise goals for yourself, such as a certain weight goal, walk or run in a race such as a 5k walk/run.  Speak to your primary care provider about exercise goals.  Disease prevention:  If you smoke or chew tobacco, find out from your caregiver how to quit. It can literally save your life, no matter how long you have been a tobacco user. If you do not use tobacco, never begin.  Maintain a healthy diet and normal weight. Increased weight leads to problems with blood pressure and diabetes.   The Body Mass Index or BMI is a way of measuring how much of your body is fat. Having a BMI above 27 increases the risk of heart disease, diabetes, hypertension, stroke and other problems related to obesity. Your caregiver can help determine your BMI and based on it develop an exercise and dietary program to  help you achieve or maintain this important measurement at a healthful level.  High blood pressure causes heart and blood vessel problems.  Persistent high blood pressure should be treated with medicine if weight loss and exercise do not work.   Fat and cholesterol leaves deposits in your arteries that can block them. This causes heart disease and vessel disease elsewhere in your body.  If your cholesterol is found to be high, or if you have heart disease or certain other medical conditions, then you may need to have your cholesterol monitored frequently and be treated with medication.   Ask if you should have a cardiac stress test if your history suggests this. A stress test is a test done on a treadmill that looks for heart disease. This test can find disease prior to there being a problem.  Menopause can be associated with physical symptoms and risks. Hormone replacement therapy is available to decrease these. You should talk to your caregiver about whether starting or continuing to take hormones is right for you.   Osteoporosis is a disease in which the bones lose minerals and strength as we age. This can result in serious bone fractures. Risk of osteoporosis can be identified using a bone density scan. Women ages 23 and over should discuss this with their caregivers, as should women after menopause who have other risk factors. Ask your caregiver whether you should be taking a calcium supplement and Vitamin D, to reduce the rate of osteoporosis.   Avoid drinking alcohol in excess (more than two drinks per day).  Avoid use of street drugs. Do not share needles with anyone. Ask for professional help if you need assistance or instructions on stopping the use of alcohol, cigarettes, and/or drugs.  Brush your teeth twice a day with fluoride toothpaste, and floss once a day. Good oral hygiene prevents tooth decay and gum disease. The problems can be painful, unattractive, and can cause other health  problems. Visit your dentist for a routine oral and dental check up and preventive care every 6-12 months.   Look at your skin regularly.  Use a mirror to look at your back. Notify your caregivers of changes in moles, especially if there are changes in shapes, colors, a size larger than a pencil eraser, an irregular border, or development of new moles.  Safety:  Use seatbelts 100% of the time, whether driving or as a passenger.  Use safety devices such as hearing protection if you work in environments with loud noise or significant background noise.  Use safety glasses when doing any work that could send debris in to the eyes.  Use a helmet if you ride a bike or motorcycle.  Use appropriate safety gear for contact sports.  Talk to your caregiver about gun safety.  Use sunscreen with a SPF (or skin protection factor) of 15 or greater.  Lighter skinned people are at a greater risk of skin cancer. Don't forget to also wear sunglasses in order to protect your eyes from too much damaging sunlight. Damaging sunlight can accelerate cataract  formation.   Practice safe sex. Use condoms. Condoms are used for birth control and to help reduce the spread of sexually transmitted infections (or STIs).  Some of the STIs are gonorrhea (the clap), chlamydia, syphilis, trichomonas, herpes, HPV (human papilloma virus) and HIV (human immunodeficiency virus) which causes AIDS. The herpes, HIV and HPV are viral illnesses that have no cure. These can result in disability, cancer and death.   Keep carbon monoxide and smoke detectors in your home functioning at all times. Change the batteries every 6 months or use a model that plugs into the wall.   Vaccinations:  Stay up to date with your tetanus shots and other required immunizations. You should have a booster for tetanus every 10 years. Be sure to get your flu shot every year, since 5%-20% of the [place] population comes down with the flu. The flu vaccine changes each year,  so being vaccinated once is not enough. Get your shot in the fall, before the flu season peaks.   Other vaccines to consider:  Human Papilloma Virus or HPV causes cancer of the cervix, and other infections that can be transmitted from person to person. There is a vaccine for HPV, and females should get immunized between the ages of 49 and 54. It requires a series of 3 shots.   Pneumococcal vaccine to protect against certain types of pneumonia.  This is normally recommended for adults age 36 or older.  However, adults younger than 52 years old with certain underlying conditions such as diabetes, heart or lung disease should also receive the vaccine.  Shingles vaccine to protect against Varicella Zoster if you are older than age 79, or younger than 52 years old with certain underlying illness.  Hepatitis A vaccine to protect against a form of infection of the liver by a virus acquired from food.  Hepatitis B vaccine to protect against a form of infection of the liver by a virus acquired from blood or body fluids, particularly if you work in health care.  If you plan to travel internationally, check with your local health department for specific vaccination recommendations.  Cancer Screening:  Breast cancer screening is essential to preventive care for women. All women age 43 and older should perform a breast self-exam every month. At age 56 and older, women should have their caregiver complete a breast exam each year. Women at ages 53 and older should have a mammogram (x-ray film) of the breasts. Your caregiver can discuss how often you need mammograms.    Cervical cancer screening includes taking a Pap smear (sample of cells examined under a microscope) from the cervix (end of the uterus). It also includes testing for HPV (Human Papilloma Virus, which can cause cervical cancer). Screening and a pelvic exam should begin at age 62, or 3 years after a woman becomes sexually active. Screening should  occur every year, with a Pap smear but no HPV testing, up to age 33. After age 2, you should have a Pap smear every 3 years with HPV testing, if no HPV was found previously.   Most routine colon cancer screening begins at the age of 17. On a yearly basis, doctors may provide special easy to use take-home tests to check for hidden blood in the stool. Sigmoidoscopy or colonoscopy can detect the earliest forms of colon cancer and is life saving. These tests use a small camera at the end of a tube to directly examine the colon. Speak to your caregiver about this at  age 33, when routine screening begins (and is repeated every 5 years unless early forms of pre-cancerous polyps or small growths are found).

## 2017-09-04 NOTE — Progress Notes (Signed)
Subjective:    Patient ID: Stacey Gardner, female    DOB: 06-24-1965, 52 y.o.   MRN: [id number]  HPI Chief Complaint  Patient presents with  . fasting cpe    fasting cpe   She is here for a complete physical exam.  She moved here from [place] in 2007. Last CPE: March 04, 2016  Other providers: Dr. Juliene Pina at [facility]  GI- Dr. Loreta Ave? Eye doctor- Dr. Hyacinth Meeker   Complains of ongoing problem of numbness and tingling in her right arm, hand and occasionally her right leg. This has been ongoing for over a year. States symptoms have been worse over the past 4 weeks.  She recently saw Dr. Merlyn Lot for hand pain and paresthesias and had an injection. She denies any improvement and states she does not want to have another injection.  States she has some mild weakness in her right hand that is progressively worsening. No joint swelling, warmth. Denies arthralgias.  She is taking Tylenol with some relief of discomfort.   She also reports increased fatigue and decreased appetite. Denies significant weight loss. No abdominal pain, N/V/D or constipation.   Depression- states her mood is better and no longer feeling depressed. Taking Effexor for hot flashes.   Mixed hyperlipidemia- is not on statin.   Vitamin D deficiency- taking 2,000 IU daily  Also started on vitamin E per her OB/GYN for breast tenderness   Hypothyroidism and on levothyroxine. Reports taking this daily on an empty stomach.   No headaches, vision changes, dizziness,   Social history: Lives with husband who is wheelchair dependent due to CVA, does not work. She takes [employer] to get to her appointments. She does not drive.  Diet: healthy. Mainly vegetarian.  Excerise: has been walking more   Immunizations: 2017 Tdap at [facility]   Health maintenance:  Mammogram: 08/2017 at [facility] and normal.  Colonoscopy: 4-5 days ago  Last Gynecological Exam: 08/2017  Last Menstrual cycle: 6 months ago  Last Dental  Exam: twice annually  Last Eye Exam: up to date   Depression screen [facility] 2/9 09/04/2017 04/25/2016 03/04/2016 01/02/2015 05/16/2014  Decreased Interest 0 1 0 0 0  Down, Depressed, Hopeless 0 2 0 0 0  PHQ - 2 Score 0 3 0 0 0  Altered sleeping - 3 - - -  Tired, decreased energy - 3 - - -  Change in appetite - 3 - - -  Feeling bad or failure about yourself  - 1 - - -  Trouble concentrating - 0 - - -  Moving slowly or fidgety/restless - 0 - - -  Suicidal thoughts - 0 - - -  PHQ-9 Score - 13 - - -     Wears seatbelt always, uses sunscreen, smoke detectors in home and functioning, does not text while driving and feels safe in home environment.   Reviewed allergies, medications, past medical, surgical, family, and social history.     Review of Systems Review of Systems Constitutional: -fever, -chills, -sweats, -unexpected weight change,+fatigue ENT: -runny nose, -ear pain, -sore throat Cardiology:  -chest pain, -palpitations, -edema Respiratory: -cough, -shortness of breath, -wheezing Gastroenterology: -abdominal pain, -nausea, -vomiting, -diarrhea, -constipation  Hematology: -bleeding or bruising problems Musculoskeletal: -arthralgias, -myalgias, -joint swelling, -back pain Ophthalmology: -vision changes Urology: -dysuria, -difficulty urinating, -hematuria, -urinary frequency, -urgency Neurology: -headache, +right hand weakness, +right arm and hand tingling, + right hand numbness       Objective:   Physical Exam BP 110/70   Pulse  61   Ht 5' 6.5" (1.689 m)   Wt 149 lb 6.4 oz (67.8 kg)   BMI 23.75 kg/m   General Appearance:    Alert, cooperative, no distress, appears stated age  Head:    Normocephalic, without obvious abnormality, atraumatic  Eyes:    PERRL, conjunctiva/corneas clear, EOM's intact, fundi    benign  Ears:    Normal TM's and external ear canals  Nose:   Nares normal, mucosa normal, no drainage or sinus   tenderness  Throat:   Lips, mucosa, and tongue normal;  teeth and gums normal  Neck:   Supple, no lymphadenopathy;  thyroid:  no   enlargement/tenderness/nodules; no carotid   bruit or JVD  Back:    Spine nontender, no curvature, ROM normal, no CVA     tenderness  Lungs:     Clear to auscultation bilaterally without wheezes, rales or     ronchi; respirations unlabored  Chest Wall:    No tenderness or deformity   Heart:    Regular rate and rhythm, S1 and S2 normal, no murmur, rub   or gallop  Breast Exam:    OB/GYN   Abdomen:     Soft, non-tender, nondistended, normoactive bowel sounds,    no masses, no hepatosplenomegaly  Genitalia:    OB/GYN     Extremities:   No clubbing, cyanosis or edema. Mild right hand weakness compared to left. No thenar atrophy. Normal sensation and ROM.   Pulses:   2+ and symmetric all extremities  Skin:   Skin color, texture, turgor normal, no rashes or lesions  Lymph nodes:   Cervical, supraclavicular, and axillary nodes normal  Neurologic:   CNII-XII intact, sensation and gait; reflexes 2+ and symmetric throughout. 2 point discrimination present right hand, right arm and right leg.           Psych:   Normal mood, affect, hygiene and grooming.    Urinalysis dipstick: negative       Assessment & Plan:  Routine general medical examination at a health care facility - Plan: POCT Urinalysis DIP (Proadvantage Device), CBC with Differential/Platelet, Comprehensive metabolic panel, Lipid panel  Hypothyroidism, unspecified type - Plan: TSH, T4, free  Vitamin D deficiency - Plan: VITAMIN D 25 Hydroxy (Vit-D Deficiency, Fractures)  Mixed hyperlipidemia - Plan: Lipid panel  Numbness and tingling in right hand - Plan: Vitamin B12, Ambulatory referral to Neurology  Paresthesia of right arm and leg - Plan: CBC with Differential/Platelet, Comprehensive metabolic panel, TSH, T4, free, Vitamin B12, Ambulatory referral to Neurology  Screening for HIV without presence of risk factors - Plan: HIV antibody  Medication  management - Plan: VITAMIN D 25 Hydroxy (Vit-D Deficiency, Fractures)  Fatigue, unspecified type - Plan: CBC with Differential/Platelet, Comprehensive metabolic panel, TSH, VITAMIN D 25 Hydroxy (Vit-D Deficiency, Fractures)  Discussed multiple etiologies for fatigue.  Will check labs Labs for paresthesias and neurology referral. She declines returning to hand specialist. She will be traveling out of the country at the end of August for 2 weeks.  Hypothyroidism- doing well on medication and appears to be taking it appropriately. Check TSH Mixed hyperlipidemia - check fasting lipids Tdap up-to-date per patient however no record of this. Discussed Shingrix vaccine.  She will check with her insurance company and let us know if she decides to get this. Discussed safety and health maintenance Follow-up pending labs

## 2017-09-05 ENCOUNTER — Encounter: Payer: Self-pay | Admitting: Neurology

## 2017-09-05 LAB — LIPID PANEL
Chol/HDL Ratio: 4.3 ratio (ref 0.0–4.4)
Cholesterol, Total: 211 mg/dL — ABNORMAL HIGH (ref 100–199)
HDL: 49 mg/dL (ref >39)
LDL Calculated: 116 mg/dL — ABNORMAL HIGH (ref 0–99)
Triglycerides: 229 mg/dL — ABNORMAL HIGH (ref 0–149)
VLDL Cholesterol Cal: 46 mg/dL — ABNORMAL HIGH (ref 5–40)

## 2017-09-05 LAB — COMPREHENSIVE METABOLIC PANEL
ALK PHOS: 113 IU/L (ref 39–117)
ALT: 17 IU/L (ref 0–32)
AST: 20 IU/L (ref 0–40)
Albumin/Globulin Ratio: 2.1 (ref 1.2–2.2)
Albumin: 4.9 g/dL (ref 3.5–5.5)
BILIRUBIN TOTAL: 0.4 mg/dL (ref 0.0–1.2)
BUN/Creatinine Ratio: 20 (ref 9–23)
BUN: 18 mg/dL (ref 6–24)
CHLORIDE: 101 mmol/L (ref 96–106)
CO2: 23 mmol/L (ref 20–29)
Calcium: 9.8 mg/dL (ref 8.7–10.2)
Creatinine, Ser: 0.9 mg/dL (ref 0.57–1.00)
GFR calc Af Amer: 86 mL/min/{1.73_m2} (ref >59)
GFR calc non Af Amer: 74 mL/min/{1.73_m2} (ref >59)
Globulin, Total: 2.3 g/dL (ref 1.5–4.5)
Glucose: 79 mg/dL (ref 65–99)
Potassium: 4.2 mmol/L (ref 3.5–5.2)
Sodium: 141 mmol/L (ref 134–144)
Total Protein: 7.2 g/dL (ref 6.0–8.5)

## 2017-09-05 LAB — CBC WITH DIFFERENTIAL/PLATELET
Basophils Absolute: 0 10*3/uL (ref 0.0–0.2)
Basos: 0 %
EOS (ABSOLUTE): 0.1 10*3/uL (ref 0.0–0.4)
EOS: 1 %
HEMOGLOBIN: 14.3 g/dL (ref 11.1–15.9)
Hematocrit: 41.4 % (ref 34.0–46.6)
IMMATURE GRANULOCYTES: 0 %
Immature Grans (Abs): 0 10*3/uL (ref 0.0–0.1)
Lymphocytes Absolute: 2.1 10*3/uL (ref 0.7–3.1)
Lymphs: 30 %
MCH: 31.8 pg (ref 26.6–33.0)
MCHC: 34.5 g/dL (ref 31.5–35.7)
MCV: 92 fL (ref 79–97)
Monocytes Absolute: 0.4 10*3/uL (ref 0.1–0.9)
Monocytes: 7 %
Neutrophils Absolute: 4.2 10*3/uL (ref 1.4–7.0)
Neutrophils: 62 %
PLATELETS: 279 10*3/uL (ref 150–450)
RBC: 4.5 x10E6/uL (ref 3.77–5.28)
RDW: 14 % (ref 12.3–15.4)
WBC: 6.8 10*3/uL (ref 3.4–10.8)

## 2017-09-05 LAB — VITAMIN D 25 HYDROXY (VIT D DEFICIENCY, FRACTURES): Vit D, 25-Hydroxy: 29.9 ng/mL — ABNORMAL LOW (ref 30.0–100.0)

## 2017-09-05 LAB — VITAMIN B12: Vitamin B-12: 714 pg/mL (ref 232–1245)

## 2017-09-05 LAB — HIV ANTIBODY (ROUTINE TESTING W REFLEX): HIV SCREEN 4TH GENERATION: NONREACTIVE

## 2017-09-05 LAB — TSH: TSH: 1.64 u[IU]/mL (ref 0.450–4.500)

## 2017-09-05 LAB — T4, FREE: FREE T4: 1.56 ng/dL (ref 0.82–1.77)

## 2017-09-15 ENCOUNTER — Telehealth: Payer: Self-pay | Admitting: Family Medicine

## 2017-09-15 NOTE — Telephone Encounter (Signed)
Received requested pap from [facility]. Sending back for review.  °

## 2017-09-16 ENCOUNTER — Encounter: Payer: Self-pay | Admitting: Family Medicine

## 2017-09-24 ENCOUNTER — Telehealth: Payer: Self-pay | Admitting: Family Medicine

## 2017-09-24 NOTE — Telephone Encounter (Signed)
Received requested medical records from [facility]. Sending back for review.

## 2017-10-01 ENCOUNTER — Encounter: Payer: Self-pay | Admitting: Family Medicine

## 2017-10-02 ENCOUNTER — Encounter: Payer: Self-pay | Admitting: Family Medicine

## 2017-10-07 ENCOUNTER — Encounter: Payer: Self-pay | Admitting: Family Medicine

## 2017-10-24 ENCOUNTER — Ambulatory Visit: Payer: BLUE CROSS/BLUE SHIELD | Admitting: Family Medicine

## 2017-10-24 ENCOUNTER — Encounter: Payer: Self-pay | Admitting: Family Medicine

## 2017-10-24 VITALS — BP 124/80 | HR 63 | Temp 98.2°F | Resp 16 | Wt 152.4 lb

## 2017-10-24 DIAGNOSIS — J069 Acute upper respiratory infection, unspecified: Secondary | ICD-10-CM | POA: Diagnosis not present

## 2017-10-24 DIAGNOSIS — R059 Cough, unspecified: Secondary | ICD-10-CM

## 2017-10-24 DIAGNOSIS — R05 Cough: Secondary | ICD-10-CM | POA: Diagnosis not present

## 2017-10-24 MED ORDER — CHLORPHEN-PE-ACETAMINOPHEN 4-10-325 MG PO TABS: 1.0000 | ORAL_TABLET | Freq: Four times a day (QID) | ORAL | 0 refills | Status: DC | PRN

## 2017-10-24 MED ORDER — BENZONATATE 200 MG PO CAPS: 200.0000 mg | ORAL_CAPSULE | Freq: Two times a day (BID) | ORAL | 0 refills | Status: DC | PRN

## 2017-10-24 NOTE — Patient Instructions (Addendum)
Your symptoms appear to be related to a virus.   Take 1 tablet of Norel-AD every 6 hours with a small amount of food and a full glass of water.   Rest, stay hydrated. You can take the Tessalon for cough as well.   Call Monday if you are worse or if you are not significantly better.

## 2017-10-24 NOTE — Progress Notes (Signed)
Chief Complaint  Patient presents with  . cough    cough, and congestion, tired. went on vacation and came back monday and was sick on the way back.     Subjective:  Stacey BeaversMercedes Gardner is a 52 y.o. female who presents for a 4-5 day history of sneezing, nasal congestion, post nasal drainage, coughing and mild vomiting from triggering her gag reflux.    She just traveled back from [place] prior to onset of symptoms.   Denies fever, chills, headache, dizziness, ear pain, sore throat, chest pain, shortness of breath, abdominal pain, nausea, diarrhea, urinary symptoms.   Treatment to date: none.  Denies sick contacts.  No other aggravating or relieving factors.  No other c/o.  ROS as in subjective.   Objective: Vitals:   10/24/17 1455  BP: 124/80  Pulse: 63  Resp: 16  Temp: 98.2 F (36.8 C)  SpO2: 99%    General appearance: Alert, WD/WN, no distress, mildly ill appearing                             Skin: warm, no rash                           Head: no sinus tenderness                            Eyes: conjunctiva normal, corneas clear, PERRLA                            Ears: pearly TMs, external ear canals normal                          Nose: septum midline, turbinates swollen, with erythema and clear discharge             Mouth/throat: MMM, tongue normal, mild pharyngeal erythema                           Neck: supple, no adenopathy, no thyromegaly, nontender                          Heart: RRR, normal S1, S2, no murmurs                         Lungs: CTA bilaterally, no wheezes, rales, or rhonchi      Assessment: Acute URI - Plan: Chlorphen-PE-Acetaminophen (NOREL AD) 4-10-325 MG TABS  Cough - Plan: benzonatate (TESSALON) 200 MG capsule    Plan: Discussed diagnosis and treatment of URI. Discussed symptoms suggest a viral etiology. Norel AD samples given #12. She is aware to not take extra Tylenol. Tessalon prescribed.  Suggested symptomatic OTC remedies. Nasal saline  spray for congestion.   Call/return in 2-3 days if symptoms aren't resolving or if she worsens.

## 2017-11-12 ENCOUNTER — Telehealth: Payer: Self-pay | Admitting: Family Medicine

## 2017-11-12 NOTE — Telephone Encounter (Signed)
Received requested records from [facility]. Sending back for review.

## 2017-12-03 ENCOUNTER — Encounter: Payer: Self-pay | Admitting: Family Medicine

## 2017-12-03 ENCOUNTER — Ambulatory Visit: Payer: BLUE CROSS/BLUE SHIELD | Admitting: Family Medicine

## 2017-12-03 VITALS — BP 120/70 | HR 78 | Temp 97.7°F | Resp 16 | Wt 150.4 lb

## 2017-12-03 DIAGNOSIS — R29898 Other symptoms and signs involving the musculoskeletal system: Secondary | ICD-10-CM

## 2017-12-03 DIAGNOSIS — R200 Anesthesia of skin: Secondary | ICD-10-CM | POA: Diagnosis not present

## 2017-12-03 DIAGNOSIS — M542 Cervicalgia: Secondary | ICD-10-CM

## 2017-12-03 DIAGNOSIS — M792 Neuralgia and neuritis, unspecified: Secondary | ICD-10-CM | POA: Diagnosis not present

## 2017-12-03 DIAGNOSIS — G8929 Other chronic pain: Secondary | ICD-10-CM

## 2017-12-03 NOTE — Progress Notes (Signed)
   Subjective:    Patient ID: Stacey Gardner, female    DOB: 1965/12/05, 52 y.o.   MRN: [id number]  HPI Chief Complaint  Patient presents with  . still having hand pain    went to hand specilaist and got cortisone shot and doesn't want to go back. having issues still, and pain going up arm, and if her hand gets hit then it will hurt, and can't bend much. had neuro appt at [facility] but cancelled and wants to go to [facility] with Dr. Lucia Gaskins    She is here with chronic right hand numbness, tingling and weakness. States she also has constant pain radiating from her neck down her right arm to her fingers.  She was seen by Dr. Merlyn Lot and had an injection. States she has been worse since and does not want to follow up with them. She requested a referral to neurologist and was scheduled to see someone at [facility] neuro however she cancelled this appointment.   Requests ortho referral.   Denies fever, chills, N/V/D.   Reviewed allergies, medications, past medical, surgical, family, and social history.   Review of Systems Pertinent positives and negatives in the history of present illness.     Objective:   Physical Exam  Constitutional: She appears well-developed and well-nourished. No distress.  HENT:  Mouth/Throat: Oropharynx is clear and moist.  Eyes: Pupils are equal, round, and reactive to light. Conjunctivae are normal.  Neck: Trachea normal and full passive range of motion without pain. Neck supple. Muscular tenderness present.  Right trapezius TTP  Musculoskeletal:       Cervical back: Normal.       Thoracic back: Normal.       Right hand: Decreased sensation noted. Decreased strength noted.   BP 120/70   Pulse 78   Temp 97.7 F (36.5 C) (Oral)   Resp 16   Wt 150 lb 6.4 oz (68.2 kg)   SpO2 98%   BMI 23.91 kg/m         Assessment & Plan:  Radicular pain in right arm - Plan: Ambulatory referral to Orthopedic Surgery  Right hand weakness - Plan: Ambulatory referral to Orthopedic  Surgery  Numbness of right hand  Chronic neck pain - Plan: Ambulatory referral to Orthopedic Surgery  Referral to ortho per patient request.

## 2017-12-04 DIAGNOSIS — Z1211 Encounter for screening for malignant neoplasm of colon: Secondary | ICD-10-CM | POA: Diagnosis not present

## 2017-12-04 DIAGNOSIS — K5904 Chronic idiopathic constipation: Secondary | ICD-10-CM | POA: Diagnosis not present

## 2017-12-08 ENCOUNTER — Ambulatory Visit: Payer: Self-pay | Admitting: Neurology

## 2017-12-09 ENCOUNTER — Encounter: Payer: Self-pay | Admitting: Internal Medicine

## 2017-12-12 DIAGNOSIS — B373 Candidiasis of vulva and vagina: Secondary | ICD-10-CM | POA: Diagnosis not present

## 2017-12-16 ENCOUNTER — Ambulatory Visit (INDEPENDENT_AMBULATORY_CARE_PROVIDER_SITE_OTHER): Payer: BLUE CROSS/BLUE SHIELD | Admitting: Orthopaedic Surgery

## 2017-12-23 ENCOUNTER — Ambulatory Visit (INDEPENDENT_AMBULATORY_CARE_PROVIDER_SITE_OTHER): Payer: BLUE CROSS/BLUE SHIELD | Admitting: Orthopaedic Surgery

## 2017-12-23 ENCOUNTER — Encounter (INDEPENDENT_AMBULATORY_CARE_PROVIDER_SITE_OTHER): Payer: Self-pay | Admitting: Orthopaedic Surgery

## 2017-12-23 ENCOUNTER — Telehealth (INDEPENDENT_AMBULATORY_CARE_PROVIDER_SITE_OTHER): Payer: Self-pay | Admitting: Orthopaedic Surgery

## 2017-12-23 ENCOUNTER — Ambulatory Visit (INDEPENDENT_AMBULATORY_CARE_PROVIDER_SITE_OTHER): Payer: BLUE CROSS/BLUE SHIELD

## 2017-12-23 DIAGNOSIS — M542 Cervicalgia: Secondary | ICD-10-CM | POA: Insufficient documentation

## 2017-12-23 MED ORDER — PREDNISONE 10 MG (21) PO TBPK: ORAL_TABLET | ORAL | 0 refills | Status: DC

## 2017-12-23 MED ORDER — METHOCARBAMOL 500 MG PO TABS: 500.0000 mg | ORAL_TABLET | Freq: Two times a day (BID) | ORAL | 0 refills | Status: DC | PRN

## 2017-12-23 NOTE — Telephone Encounter (Signed)
Received voicemail message from the pharmacist at [facility]. She left voicemail message stating received Rx for (Prednisone) She advised it shows that the patient is allergic to Prednisone. The number to contact the pharmacist is [phone]. She did not leave her name.

## 2017-12-23 NOTE — Progress Notes (Signed)
Office Visit Note   Patient: Stacey BeaversMercedes Gardner           Date of Birth: 08/12/1965           MRN: [id number] Visit Date: 12/23/2017              Requested by: Avanell ShackletonHenson, Vickie L, NP-C [address] [place], [place] [id number] PCP: Avanell ShackletonHenson, Vickie L, NP-C   Assessment & Plan: Visit Diagnoses:  1. Neck pain     Plan: Impression is cervical spine radiculopathy versus Carpal Tunl. and Cubital Tunl. syndrome.  At this point, we will start the patient on a Sterapred taper as well as a muscle relaxer.  I will send her to formal physical therapy as well.  She will follow-up with us in 8 weeks time for recheck.  If she is not any better, we may entertain proceeding with nerve conduction study/EMG.  Of note, she states that she gets a rash from topical cortisone but that she has taken oral prednisone before and does tolerate this.  Follow-Up Instructions: Return in about 8 weeks (around 02/17/2018).   Orders:  Orders Placed This Encounter  Procedures  . XR Cervical Spine 2 or 3 views   Meds ordered this encounter  Medications  . predniSONE (STERAPRED UNI-PAK 21 TAB) 10 MG (21) TBPK tablet    Sig: Take as directed    Dispense:  21 tablet    Refill:  0  . methocarbamol (ROBAXIN) 500 MG tablet    Sig: Take 1 tablet (500 mg total) by mouth 2 (two) times daily as needed for muscle spasms.    Dispense:  15 tablet    Refill:  0      Procedures: No procedures performed   Clinical Data: No additional findings.   Subjective: Chief Complaint  Patient presents with  . Neck - Pain, Numbness, Weakness  . Right Arm - Pain, Numbness, Weakness    HPI patient is a pleasant 52 year old female presents to our clinic today with neck and right upper extremity pain.  This began a few months back without any known injury or change in activity.  She notes pain from the lateral side of her neck down into her hand.  She notes weakness to the right hand as well.  She is also complaining of constant  numbness to all 5 fingers except for the index finger.  This is no worse at any point of the day or with any certain activity.  She does also note stiffness to the right hand.  She states that her dad has a history of rheumatoid arthritis but she was tested for this and was negative.  Review of Systems as detailed in HPI.  All others reviewed and are negative.   Objective: Vital Signs: There were no vitals taken for this visit.  Physical Exam well-developed well-nourished female in no acute distress.  Alert and oriented x3.  Ortho Exam examination of her cervical spine reveals no spinous or paraspinous tenderness.  Increased pain with lateral rotation to the left.  Full range of motion of the right shoulder.  Full range of motion of the right hand.  Decreased grip strength.  Full sensation distally.  Negative Phalen and negative Tinel at the wrist.  Minimally positive Tinel at the elbow.  Specialty Comments:  No specialty comments available.  Imaging: Xr Cervical Spine 2 Or 3 Views  Result Date: 12/23/2017 Diffuse degenerative disc disease with abnormal straightening of the cervical spine    PMFS History:  Patient Active Problem List   Diagnosis Date Noted  . Neck pain 12/23/2017  . Vitamin D deficiency 03/04/2016  . Mixed hyperlipidemia 03/04/2016  . Muscle cramps 03/04/2016  . Paresthesias 03/04/2016  . Allergic rhinitis, cause unspecified 06/24/2013  . Hypothyroidism 06/24/2013   Past Medical History:  Diagnosis Date  . Allergy   . Anemia   . Depression   . Hypothyroid   . Mixed hyperlipidemia 03/04/2016  . Vitamin D deficiency     Family History  Problem Relation Age of Onset  . Cancer Mother 76       breast  . AAA (abdominal aortic aneurysm) Mother   . Heart disease Father     Past Surgical History:  Procedure Laterality Date  . CERVICAL POLYPECTOMY     Social History   Occupational History  . Not on file  Tobacco Use  . Smoking status: Never Smoker  .  Smokeless tobacco: Never Used  Substance and Sexual Activity  . Alcohol use: No    Alcohol/week: 0.0 standard drinks  . Drug use: No  . Sexual activity: Not on file

## 2017-12-23 NOTE — Telephone Encounter (Signed)
SEE MESSAGE BELOW:

## 2017-12-23 NOTE — Telephone Encounter (Signed)
Spoke to pharmacist and will fill this rx.  Patient told me that she had rash/itch from topical cortisone but that she took her friends prednisone by mouth without any issues

## 2017-12-29 DIAGNOSIS — Z1211 Encounter for screening for malignant neoplasm of colon: Secondary | ICD-10-CM | POA: Diagnosis not present

## 2017-12-29 LAB — HM COLONOSCOPY

## 2017-12-31 DIAGNOSIS — M2560 Stiffness of unspecified joint, not elsewhere classified: Secondary | ICD-10-CM | POA: Diagnosis not present

## 2017-12-31 DIAGNOSIS — M25521 Pain in right elbow: Secondary | ICD-10-CM | POA: Diagnosis not present

## 2017-12-31 DIAGNOSIS — M542 Cervicalgia: Secondary | ICD-10-CM | POA: Diagnosis not present

## 2017-12-31 DIAGNOSIS — M6281 Muscle weakness (generalized): Secondary | ICD-10-CM | POA: Diagnosis not present

## 2018-01-06 DIAGNOSIS — M2560 Stiffness of unspecified joint, not elsewhere classified: Secondary | ICD-10-CM | POA: Diagnosis not present

## 2018-01-06 DIAGNOSIS — M6281 Muscle weakness (generalized): Secondary | ICD-10-CM | POA: Diagnosis not present

## 2018-01-06 DIAGNOSIS — M542 Cervicalgia: Secondary | ICD-10-CM | POA: Diagnosis not present

## 2018-01-06 DIAGNOSIS — M25521 Pain in right elbow: Secondary | ICD-10-CM | POA: Diagnosis not present

## 2018-01-08 DIAGNOSIS — M25521 Pain in right elbow: Secondary | ICD-10-CM | POA: Diagnosis not present

## 2018-01-08 DIAGNOSIS — M6281 Muscle weakness (generalized): Secondary | ICD-10-CM | POA: Diagnosis not present

## 2018-01-08 DIAGNOSIS — M2560 Stiffness of unspecified joint, not elsewhere classified: Secondary | ICD-10-CM | POA: Diagnosis not present

## 2018-01-08 DIAGNOSIS — M542 Cervicalgia: Secondary | ICD-10-CM | POA: Diagnosis not present

## 2018-01-12 ENCOUNTER — Telehealth (INDEPENDENT_AMBULATORY_CARE_PROVIDER_SITE_OTHER): Payer: Self-pay | Admitting: *Deleted

## 2018-01-12 DIAGNOSIS — M542 Cervicalgia: Secondary | ICD-10-CM

## 2018-01-12 NOTE — Telephone Encounter (Signed)
[facility] stating is needing a PT RX, please fax to [phone].

## 2018-01-13 ENCOUNTER — Encounter: Payer: Self-pay | Admitting: Internal Medicine

## 2018-01-13 NOTE — Telephone Encounter (Signed)
Faxed

## 2018-01-13 NOTE — Telephone Encounter (Signed)
I do not have a copy of PT Rx, please advise what Rx should direct?

## 2018-01-13 NOTE — Telephone Encounter (Signed)
Eval and treat neck pain.  Modalities prn.

## 2018-03-04 ENCOUNTER — Encounter: Payer: Self-pay | Admitting: Family Medicine

## 2018-03-04 ENCOUNTER — Ambulatory Visit: Payer: BLUE CROSS/BLUE SHIELD | Admitting: Family Medicine

## 2018-03-04 VITALS — BP 120/70 | HR 70 | Temp 98.0°F | Resp 16 | Wt 151.6 lb

## 2018-03-04 DIAGNOSIS — R059 Cough, unspecified: Secondary | ICD-10-CM

## 2018-03-04 DIAGNOSIS — J069 Acute upper respiratory infection, unspecified: Secondary | ICD-10-CM

## 2018-03-04 DIAGNOSIS — R05 Cough: Secondary | ICD-10-CM

## 2018-03-04 MED ORDER — BENZONATATE 200 MG PO CAPS: 200.0000 mg | ORAL_CAPSULE | Freq: Two times a day (BID) | ORAL | 0 refills | Status: DC | PRN

## 2018-03-04 NOTE — Patient Instructions (Signed)
I suspect your symptoms are related to a viral illness and recommend treating your symptoms at this point.  Mucinex DM for congestion and cough, drink extra water, use salt water gargles for throat irritation and Tylenol or Ibuprofen for aches and pains.  You may also take the Tessalon for cough.    Call if you are not improving by Friday or if you are getting much worse.

## 2018-03-04 NOTE — Progress Notes (Signed)
Chief Complaint  Patient presents with  . cold    cold symptoms. started jan 25. go back from [place] on Jan 24th.     Subjective:  Stacey Gardner is a 53 y.o. female who presents for a 6 day history of sore throat, rhinorrhea, nasal congestion, post nasal drainage, cough. She recently traveled to [place] and her symptoms started one day after she returned home.   Denies fever, chills, chest pain, palpitations, shortness of breath, wheezing, abdominal pain, N/V/D.   Treatment to date: ibuprofen.  Denies sick contacts.  No other aggravating or relieving factors.  No other c/o.  ROS as in subjective.   Objective: Vitals:   03/04/18 1343  BP: 120/70  Pulse: 70  Resp: 16  Temp: 98 F (36.7 C)  SpO2: 97%    General appearance: Alert, WD/WN, no distress, mildly ill appearing                             Skin: warm, no rash                           Head: no sinus tenderness                            Eyes: conjunctiva normal, corneas clear, PERRLA                            Ears: pearly TMs, external ear canals normal                          Nose: septum midline, turbinates swollen, with erythema and clear discharge             Mouth/throat: MMM, tongue normal, mild pharyngeal erythema                           Neck: supple, no adenopathy, no thyromegaly, nontender                          Heart: RRR, normal S1, S2, no murmurs                         Lungs: CTA bilaterally, no wheezes, rales, or rhonchi      Assessment: Acute URI  Cough - Plan: benzonatate (TESSALON) 200 MG capsule    Plan: Discussed diagnosis and treatment of URI. Tessalon prescribed for cough.  Suggested symptomatic OTC remedies. Mucinex DM, salt water gargles, nasal saline spray for congestion.  Tylenol or Ibuprofen OTC for fever and malaise.  Call/return in 2-3 days if symptoms aren't resolving.

## 2018-03-09 DIAGNOSIS — M25521 Pain in right elbow: Secondary | ICD-10-CM | POA: Diagnosis not present

## 2018-03-09 DIAGNOSIS — M2560 Stiffness of unspecified joint, not elsewhere classified: Secondary | ICD-10-CM | POA: Diagnosis not present

## 2018-03-09 DIAGNOSIS — M542 Cervicalgia: Secondary | ICD-10-CM | POA: Diagnosis not present

## 2018-03-09 DIAGNOSIS — M6281 Muscle weakness (generalized): Secondary | ICD-10-CM | POA: Diagnosis not present

## 2018-03-12 DIAGNOSIS — M2560 Stiffness of unspecified joint, not elsewhere classified: Secondary | ICD-10-CM | POA: Diagnosis not present

## 2018-03-12 DIAGNOSIS — M542 Cervicalgia: Secondary | ICD-10-CM | POA: Diagnosis not present

## 2018-03-12 DIAGNOSIS — M6281 Muscle weakness (generalized): Secondary | ICD-10-CM | POA: Diagnosis not present

## 2018-03-12 DIAGNOSIS — M25521 Pain in right elbow: Secondary | ICD-10-CM | POA: Diagnosis not present

## 2018-03-16 ENCOUNTER — Telehealth: Payer: Self-pay | Admitting: Family Medicine

## 2018-03-16 NOTE — Telephone Encounter (Signed)
Pt will call to schedule.

## 2018-03-16 NOTE — Telephone Encounter (Signed)
We put a referral back in august 2019. Pt was suppose to call back to schedule.  [facility] [phone]

## 2018-03-16 NOTE — Telephone Encounter (Signed)
Please see my note from 12/04/2018 and refer her to neurology per patient request for hand numbness, tingling, weakness.

## 2018-03-16 NOTE — Telephone Encounter (Signed)
Pt called and stated that her hand is still hurting and  wanted to know if she could get the referral to [facility] neurology.

## 2018-03-19 DIAGNOSIS — M25521 Pain in right elbow: Secondary | ICD-10-CM | POA: Diagnosis not present

## 2018-03-19 DIAGNOSIS — M6281 Muscle weakness (generalized): Secondary | ICD-10-CM | POA: Diagnosis not present

## 2018-03-19 DIAGNOSIS — M2560 Stiffness of unspecified joint, not elsewhere classified: Secondary | ICD-10-CM | POA: Diagnosis not present

## 2018-03-19 DIAGNOSIS — M542 Cervicalgia: Secondary | ICD-10-CM | POA: Diagnosis not present

## 2018-03-25 ENCOUNTER — Other Ambulatory Visit: Payer: Self-pay | Admitting: Family Medicine

## 2018-03-25 ENCOUNTER — Telehealth: Payer: Self-pay | Admitting: Family Medicine

## 2018-03-25 DIAGNOSIS — R29898 Other symptoms and signs involving the musculoskeletal system: Secondary | ICD-10-CM

## 2018-03-25 DIAGNOSIS — R200 Anesthesia of skin: Secondary | ICD-10-CM

## 2018-03-25 DIAGNOSIS — R202 Paresthesia of skin: Secondary | ICD-10-CM

## 2018-03-25 NOTE — Telephone Encounter (Signed)
Can you check on this?

## 2018-03-25 NOTE — Telephone Encounter (Signed)
Pt called and said [facility] told her since its a new year she needs another referral. I advised pt that one was sent last year and she said they told her she needed a new one.

## 2018-03-25 NOTE — Telephone Encounter (Signed)
I have put new referral in to [facility] for pt. I have called pt to let her know

## 2018-03-26 DIAGNOSIS — M25521 Pain in right elbow: Secondary | ICD-10-CM | POA: Diagnosis not present

## 2018-03-26 DIAGNOSIS — M542 Cervicalgia: Secondary | ICD-10-CM | POA: Diagnosis not present

## 2018-03-26 DIAGNOSIS — M6281 Muscle weakness (generalized): Secondary | ICD-10-CM | POA: Diagnosis not present

## 2018-03-26 DIAGNOSIS — M2560 Stiffness of unspecified joint, not elsewhere classified: Secondary | ICD-10-CM | POA: Diagnosis not present

## 2018-03-31 DIAGNOSIS — M2560 Stiffness of unspecified joint, not elsewhere classified: Secondary | ICD-10-CM | POA: Diagnosis not present

## 2018-03-31 DIAGNOSIS — M25521 Pain in right elbow: Secondary | ICD-10-CM | POA: Diagnosis not present

## 2018-03-31 DIAGNOSIS — M542 Cervicalgia: Secondary | ICD-10-CM | POA: Diagnosis not present

## 2018-03-31 DIAGNOSIS — M6281 Muscle weakness (generalized): Secondary | ICD-10-CM | POA: Diagnosis not present

## 2018-04-02 DIAGNOSIS — M542 Cervicalgia: Secondary | ICD-10-CM | POA: Diagnosis not present

## 2018-04-02 DIAGNOSIS — M6281 Muscle weakness (generalized): Secondary | ICD-10-CM | POA: Diagnosis not present

## 2018-04-02 DIAGNOSIS — M2560 Stiffness of unspecified joint, not elsewhere classified: Secondary | ICD-10-CM | POA: Diagnosis not present

## 2018-04-02 DIAGNOSIS — M25521 Pain in right elbow: Secondary | ICD-10-CM | POA: Diagnosis not present

## 2018-04-09 DIAGNOSIS — H25041 Posterior subcapsular polar age-related cataract, right eye: Secondary | ICD-10-CM | POA: Diagnosis not present

## 2018-05-28 ENCOUNTER — Ambulatory Visit: Payer: BLUE CROSS/BLUE SHIELD | Admitting: Neurology

## 2018-05-30 ENCOUNTER — Other Ambulatory Visit: Payer: Self-pay | Admitting: Family Medicine

## 2018-07-20 ENCOUNTER — Telehealth: Payer: Self-pay | Admitting: *Deleted

## 2018-07-20 NOTE — Telephone Encounter (Signed)
I called the pt on home # & LVM asking for a cb re: 6/17 Wed 1:30 PM appt. Offered for pt to come into office if she would like. I asked for a call back to discuss this as we have some things to go over. I advised if we do not receive a call back by tomorrow 6/16 1;30 pm appt will be canceled and pt will need to r/s to the next available appt. Also tried to call mobile # but received message that number had been disconnected.   COVID risk score 0.  When pt calls back, please screen her with COVID19 questions and ensure she is comfortable with coming in. Advise of mask requirement and outside check-in process.

## 2018-07-22 ENCOUNTER — Ambulatory Visit: Payer: BLUE CROSS/BLUE SHIELD | Admitting: Neurology

## 2018-08-13 ENCOUNTER — Other Ambulatory Visit: Payer: Self-pay

## 2018-08-13 ENCOUNTER — Ambulatory Visit: Payer: BC Managed Care – PPO | Admitting: Neurology

## 2018-08-13 ENCOUNTER — Encounter: Payer: Self-pay | Admitting: Neurology

## 2018-08-13 VITALS — BP 121/84 | HR 64 | Temp 97.5°F | Ht 65.5 in | Wt 149.0 lb

## 2018-08-13 DIAGNOSIS — Z8261 Family history of arthritis: Secondary | ICD-10-CM | POA: Diagnosis not present

## 2018-08-13 DIAGNOSIS — M79601 Pain in right arm: Secondary | ICD-10-CM

## 2018-08-13 DIAGNOSIS — R29898 Other symptoms and signs involving the musculoskeletal system: Secondary | ICD-10-CM

## 2018-08-13 DIAGNOSIS — M4722 Other spondylosis with radiculopathy, cervical region: Secondary | ICD-10-CM

## 2018-08-13 MED ORDER — GABAPENTIN 300 MG PO CAPS: 300.0000 mg | ORAL_CAPSULE | Freq: Three times a day (TID) | ORAL | 11 refills | Status: DC

## 2018-08-13 NOTE — Patient Instructions (Addendum)
EMG/NCS MRI cervical spine Start with Gabapentin at night and can increase to 3x a day watch for sedation   Magnetic Resonance Imaging Magnetic resonance imaging (MRI) is a painless test that takes pictures of the inside of your body. This test uses a strong magnet. This test does not use X-rays. What happens before the procedure?  You will be asked about any metal you may have in your body. This includes: ? Any joint replacement (prosthesis), such as an artificial knee or hip. ? Any implanted devices or ports. ? A metallic ear implant (cochlear implant). ? An artificial heart valve. ? A metallic object in the eye. ? Metal splinters. ? Bullet fragments. ? Any tattoos. Some of the darker inks can cause problems with testing.  You will be asked to take off all metal. This includes: ? Your watch, jewelry, and other metal objects. ? Hearing aids. ? Dentures. ? Underwire bra. ? Makeup. Some kinds of makeup contain small amounts of metal. ? Braces and fillings normally are not a problem.  If you are breastfeeding, ask your doctor if you need to pump before your test and then stop breastfeeding for a short time. You may need to do this if dye (contrast material) will be used during your MRI.  If you have a fear of cramped spaces (claustrophobia), you may be given medicines prior to the MRI. What happens during the procedure?   You may be given earplugs or headphones to listen to music. The MRI machine can be noisy.  You will lie down on a platform. It looks like a long table.  If dye will be used, an IV tube will be placed into one of your veins. Dye will be given through your IV tube at a certain time as images are taken.  The platform will slide into a tunnel. The tunnel has magnets inside of it. When you are inside the tunnel, you will still be able to talk to your doctor.  The tunnel will scan your body and make images. You will be asked to lie very still. Your doctor will tell  you when you can move. You may have to wait a few minutes to make sure that the images from the test are clear.  When all images are taken, the platform will slide out of the tunnel. The procedure may vary among doctors and hospitals. What happens after the procedure?  You may be taken to a recovery area if sedation medicines were used. Your blood pressure, heart rate, breathing rate, and blood oxygen level will be monitored until you leave the hospital or clinic.  If dye was used: ? It will leave your body through your pee (urine). It takes about a day for all of the dye to leave the body. ? You may be told to drink plenty of fluids. This helps your body get rid of the dye. ? If you are breastfeeding, do not breastfeed your child until your doctor says that this is safe.  You may go back to your normal activities right away, or as told by your doctor.  It is up to you to get your test results. Ask your doctor, or the department that is doing the test, when your results will be ready. Summary  Magnetic resonance imaging (MRI) is a test that takes pictures of the inside of your body.  Before your MRI, be sure to tell your doctor about any metal you may have in your body.  You may go  back to your normal activities right away, or as told by your doctor. This information is not intended to replace advice given to you by your health care provider. Make sure you discuss any questions you have with your health care provider. Document Released: 02/23/2010 Document Revised: 12/16/2016 Document Reviewed: 12/16/2016 Elsevier Patient Education  2020 [employer]  Electromyoneurogram Electromyoneurogram is a test to check how well your muscles and nerves are working. This procedure includes the combined use of electromyogram (EMG) and nerve conduction study (NCS). EMG is used to look for muscular disorders. NCS, which is also called electroneurogram, measures how well your nerves are controlling  your muscles. The procedures are usually done together to check if your muscles and nerves are healthy. If the results of the tests are abnormal, this may indicate disease or injury, such as a neuromuscular disease or peripheral nerve damage. Tell a health care provider about:  Any allergies you have.  All medicines you are taking, including vitamins, herbs, eye drops, creams, and over-the-counter medicines.  Any problems you or family members have had with anesthetic medicines.  Any blood disorders you have.  Any surgeries you have had.  Any medical conditions you have.  If you have a pacemaker.  Whether you are pregnant or may be pregnant. What are the risks? Generally, this is a safe procedure. However, problems may occur, including:  Infection where the electrodes were inserted.  Bleeding. What happens before the procedure? Medicines Ask your health care provider about:  Changing or stopping your regular medicines. This is especially important if you are taking diabetes medicines or blood thinners.  Taking medicines such as aspirin and ibuprofen. These medicines can thin your blood. Do not take these medicines unless your health care provider tells you to take them.  Taking over-the-counter medicines, vitamins, herbs, and supplements. General instructions  Your health care provider may ask you to avoid: ? Beverages that have caffeine, such as coffee and tea. ? Any products that contain nicotine or tobacco. These products include cigarettes, e-cigarettes, and chewing tobacco. If you need help quitting, ask your health care provider.  Do not use lotions or creams on the same day that you will be having the procedure. What happens during the procedure? For EMG   Your health care provider will ask you to stay in a position so that he or she can access the muscle that will be studied. You may be standing, sitting, or lying down.  You may be given a medicine that numbs  the area (local anesthetic).  A very thin needle that has an electrode will be inserted into your muscle.  Another small electrode will be placed on your skin near the muscle.  Your health care provider will ask you to continue to remain still.  The electrodes will send a signal that tells about the electrical activity of your muscles. You may see this on a monitor or hear it in the room.  After your muscles have been studied at rest, your health care provider will ask you to contract or flex your muscles. The electrodes will send a signal that tells about the electrical activity of your muscles.  Your health care provider will remove the electrodes and the electrode needles when the procedure is finished. The procedure may vary among health care providers and hospitals. For NCS   An electrode that records your nerve activity (recording electrode) will be placed on your skin by the muscle that is being studied.  An electrode  that is used as a reference (reference electrode) will be placed near the recording electrode.  A paste or gel will be applied to your skin between the recording electrode and the reference electrode.  Your nerve will be stimulated with a mild shock. Your health care provider will measure how much time it takes for your muscle to react.  Your health care provider will remove the electrodes and the gel when the procedure is finished. The procedure may vary among health care providers and hospitals. What happens after the procedure?  It is up to you to get the results of your procedure. Ask your health care provider, or the department that is doing the procedure, when your results will be ready.  Your health care provider may: ? Give you medicines for any pain. ? Monitor the insertion sites to make sure that bleeding stops. Summary  Electromyoneurogram is a test to check how well your muscles and nerves are working.  If the results of the tests are abnormal,  this may indicate disease or injury.  This is a safe procedure. However, problems may occur, such as bleeding and infection.  Your health care provider will do two tests to complete this procedure. One checks your muscles (EMG) and another checks your nerves (NCS).  It is up to you to get the results of your procedure. Ask your health care provider, or the department that is doing the procedure, when your results will be ready. This information is not intended to replace advice given to you by your health care provider. Make sure you discuss any questions you have with your health care provider. Document Released: 05/24/2004 Document Revised: 10/07/2017 Document Reviewed: 09/19/2017 Elsevier Patient Education  2020 [employer] Gabapentin capsules or tablets What is this medicine? GABAPENTIN (GA ba pen tin) is used to control seizures in certain types of epilepsy. It is also used to treat certain types of nerve pain. This medicine may be used for other purposes; ask your health care provider or pharmacist if you have questions. COMMON BRAND NAME(S): Active-PAC with Gabapentin, Gabarone, Neurontin What should I tell my health care provider before I take this medicine? They need to know if you have any of these conditions:  history of drug abuse or alcohol abuse problem  kidney disease  lung or breathing disease  suicidal thoughts, plans, or attempt; a previous suicide attempt by you or a family member  an unusual or allergic reaction to gabapentin, other medicines, foods, dyes, or preservatives  pregnant or trying to get pregnant  breast-feeding How should I use this medicine? Take this medicine by mouth with a glass of water. Follow the directions on the prescription label. You can take it with or without food. If it upsets your stomach, take it with food. Take your medicine at regular intervals. Do not take it more often than directed. Do not stop taking except on your doctor's  advice. If you are directed to break the 600 or 800 mg tablets in half as part of your dose, the extra half tablet should be used for the next dose. If you have not used the extra half tablet within 28 days, it should be thrown away. A special MedGuide will be given to you by the pharmacist with each prescription and refill. Be sure to read this information carefully each time. Talk to your pediatrician regarding the use of this medicine in children. While this drug may be prescribed for children as young as 3 years for selected  conditions, precautions do apply. Overdosage: If you think you have taken too much of this medicine contact a poison control center or emergency room at once. NOTE: This medicine is only for you. Do not share this medicine with others. What if I miss a dose? If you miss a dose, take it as soon as you can. If it is almost time for your next dose, take only that dose. Do not take double or extra doses. What may interact with this medicine? This medicine may interact with the following medications:  alcohol  antihistamines for allergy, cough, and cold  certain medicines for anxiety or sleep  certain medicines for depression like amitriptyline, fluoxetine, sertraline  certain medicines for seizures like phenobarbital, primidone  certain medicines for stomach problems  general anesthetics like halothane, isoflurane, methoxyflurane, propofol  local anesthetics like lidocaine, pramoxine, tetracaine  medicines that relax muscles for surgery  narcotic medicines for pain  phenothiazines like chlorpromazine, mesoridazine, prochlorperazine, thioridazine This list may not describe all possible interactions. Give your health care provider a list of all the medicines, herbs, non-prescription drugs, or dietary supplements you use. Also tell them if you smoke, drink alcohol, or use illegal drugs. Some items may interact with your medicine. What should I watch for while using  this medicine? Visit your doctor or health care provider for regular checks on your progress. You may want to keep a record at home of how you feel your condition is responding to treatment. You may want to share this information with your doctor or health care provider at each visit. You should contact your doctor or health care provider if your seizures get worse or if you have any new types of seizures. Do not stop taking this medicine or any of your seizure medicines unless instructed by your doctor or health care provider. Stopping your medicine suddenly can increase your seizures or their severity. This medicine may cause serious skin reactions. They can happen weeks to months after starting the medicine. Contact your health care provider right away if you notice fevers or flu-like symptoms with a rash. The rash may be red or purple and then turn into blisters or peeling of the skin. Or, you might notice a red rash with swelling of the face, lips or lymph nodes in your neck or under your arms. Wear a medical identification bracelet or chain if you are taking this medicine for seizures, and carry a card that lists all your medications. You may get drowsy, dizzy, or have blurred vision. Do not drive, use machinery, or do anything that needs mental alertness until you know how this medicine affects you. To reduce dizzy or fainting spells, do not sit or stand up quickly, especially if you are an older patient. Alcohol can increase drowsiness and dizziness. Avoid alcoholic drinks. Your mouth may get dry. Chewing sugarless gum or sucking hard candy, and drinking plenty of water will help. The use of this medicine may increase the chance of suicidal thoughts or actions. Pay special attention to how you are responding while on this medicine. Any worsening of mood, or thoughts of suicide or dying should be reported to your health care provider right away. Women who become pregnant while using this medicine may  enroll in the [place] Antiepileptic Drug Pregnancy Registry by calling [phone]. This registry collects information about the safety of antiepileptic drug use during pregnancy. What side effects may I notice from receiving this medicine? Side effects that you should report to your doctor  or health care professional as soon as possible:  allergic reactions like skin rash, itching or hives, swelling of the face, lips, or tongue  breathing problems  rash, fever, and swollen lymph nodes  redness, blistering, peeling or loosening of the skin, including inside the mouth  suicidal thoughts, mood changes Side effects that usually do not require medical attention (report to your doctor or health care professional if they continue or are bothersome):  dizziness  drowsiness  headache  nausea, vomiting  swelling of ankles, feet, hands  tiredness This list may not describe all possible side effects. Call your doctor for medical advice about side effects. You may report side effects to FDA at 1-800-FDA-1088. Where should I keep my medicine? Keep out of reach of children. This medicine may cause accidental overdose and death if it taken by other adults, children, or pets. Mix any unused medicine with a substance like cat litter or coffee grounds. Then throw the medicine away in a sealed container like a sealed bag or a coffee can with a lid. Do not use the medicine after the expiration date. Store at room temperature between 15 and 30 degrees C (59 and 86 degrees F). NOTE: This sheet is a summary. It may not cover all possible information. If you have questions about this medicine, talk to your doctor, pharmacist, or health care provider.  2020 Elsevier/Gold Standard (2018-04-24 14:16:43)

## 2018-08-13 NOTE — Progress Notes (Addendum)
[facility]    Provider:  Dr Lucia GaskinsAhern Requesting Provider: Avanell ShackletonHenson, Vickie L, NP-C Primary Care Provider:  Avanell ShackletonHenson, Vickie L, NP-C   Addendum 09/02/2018: reviewed MRI cervical spine, Possible right C5-C6 impingement. I think this is causing her neck and arm symptoms but it does not correlate to the symptoms in the ulnar distribution of the right hand, so I think these are 2 separate issues. Sent her to Dr. Amanda PeaGramig and [facility].  CC: Right arm pain, paresthesia, numbness from neck to the right arm  HPI:  Stacey Gardner is a 53 y.o. female here as requested by Avanell ShackletonHenson, Vickie L, NP-C for right hand pain.  Past medical history anemia, asthma, depression, hepatitis, thyroid disease.  She never smoked, no alcohol or drug use. She was diagnosed with stenosing tenosynovitis of the right ring finger. Started with a trigger finger of digit 4 and had injections. She feels like it is not her hand, hard to describe, tingling, numb, pain in the elbow and the whole arm, the whole arm bothers her and she has neck pain with radiation down the arm. She also had physical therapy. She can;t make a fist. She is right dominant but trying to use the left hand. Having difficulty painting, digits4-5 are worse but its the whole hand, the left is not affected. Sometimes it gets more numb all night but it is symptomatic all day. 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 Her husband had a stroke. No other focal neurologic deficits, associated symptoms, inciting events or modifiable factors.  Reviewed notes, labs and imaging from outside physicians, which showed:  XR of c-spine reviewed images and agree: Diffuse degenerative disc disease with abnormal straightening of the cervical spine  I reviewed orthopedics notes in July 2019.  This was 2 years ago when she was 53 years old.  She was seen for pain in her right hand.  Have been going on for several months.  No history  of injury.  No history of injury to her neck or hand.  Not had prior symptoms similar.  She was taking ibuprofen and Tylenol with some relief, she was given a sleeve to wear at her wrist but has put it on his hand which helped.  She has pain over the right ring finger, metacarpal phalangeal joint volarly, and sharp in nature when it occurs, mild to moderate with some radiation proximally.  She has a funny feeling in her arm.  She had one episode with both arms and legs problematic when she woke up but resolved.  Not had that before a week prior to appointment.  No history of diabetes, arthritis or gout.  She does have thyroid problems.  Family history negative for each of these with a questionable history of gout.  No nighttime symptoms.  Nothing seems to make it better.  Patient ambulates with a cane.  She has full flexion extension of all fingers.  Nodule present at the A1 pulley of the right finger with a very mild triggering where her pain is located.  Negative Tinel's over the carpal canal or Phalen's bilaterally.  Full range of motion of the neck with mild pain to flexion posteriorly.  Full range of motion at the shoulder elbow wrist and fingers.  No weakness of intrinsics of her hand or thumb or extend intrinsic flexors of her thumbs or fingers of her wrist.  Reflexes 1+ and positive.   Review of Systems: Patient complains of  symptoms per HPI as well as the following symptoms hand pain. Pertinent negatives and positives per HPI. All others negative.   Social History   Socioeconomic History   Marital status: Married    Spouse name: Not on file   Number of children: 0   Years of education: 5 years of college in fine arts, received degree (unsure what level)   Highest education level: Not on file  Occupational History   Not on file  Social Needs   Financial resource strain: Not on file   Food insecurity    Worry: Not on file    Inability: Not on file   Transportation needs     Medical: Not on file    Non-medical: Not on file  Tobacco Use   Smoking status: Never Smoker   Smokeless tobacco: Never Used  Substance and Sexual Activity   Alcohol use: No    Alcohol/week: 0.0 standard drinks   Drug use: No   Sexual activity: Not on file  Lifestyle   Physical activity    Days per week: Not on file    Minutes per session: Not on file   Stress: Not on file  Relationships   Social connections    Talks on phone: Not on file    Gets together: Not on file    Attends religious service: Not on file    Active member of club or organization: Not on file    Attends meetings of clubs or organizations: Not on file    Relationship status: Not on file   Intimate partner violence    Fear of current or ex partner: Not on file    Emotionally abused: Not on file    Physically abused: Not on file    Forced sexual activity: Not on file  Other Topics Concern   Not on file  Social History Narrative   Married. Education: Other      Lives at home with her husband. She takes care of him and he is wheelchair bound.   Right handed    Family History  Problem Relation Age of Onset   Cancer Mother 61       breast   AAA (abdominal aortic aneurysm) Mother    Heart disease Father     Past Medical History:  Diagnosis Date   Allergy    Anemia    Depression    Hypothyroid    Mixed hyperlipidemia 03/04/2016   Vitamin D deficiency     Patient Active Problem List   Diagnosis Date Noted   Neck pain 12/23/2017   Vitamin D deficiency 03/04/2016   Mixed hyperlipidemia 03/04/2016   Muscle cramps 03/04/2016   Paresthesias 03/04/2016   Allergic rhinitis, cause unspecified 06/24/2013   Hypothyroidism 06/24/2013    Past Surgical History:  Procedure Laterality Date   CERVICAL POLYPECTOMY      Current Outpatient Medications  Medication Sig Dispense Refill   acetaminophen (TYLENOL 8 HOUR ARTHRITIS PAIN) 650 MG CR tablet Take 1,300 mg by mouth as  needed for pain.      levothyroxine (SYNTHROID) 88 MCG tablet TAKE ONE TABLET BY MOUTH DAILY BEFORE BREAKFAST 90 tablet 0   minocycline (MINOCIN,DYNACIN) 100 MG capsule Take 100 mg by mouth daily.     Cholecalciferol (VITAMIN D3 PO) Take by mouth.     gabapentin (NEURONTIN) 300 MG capsule Take 1 capsule (300 mg total) by mouth 3 (three) times daily. 90 capsule 11   No current facility-administered medications for this visit.  Allergies as of 08/13/2018 - Review Complete 08/13/2018  Allergen Reaction Noted   Codeine Nausea Only 04/24/2011   Cough syrup [guaifenesin]  12/21/2015   Cortisone Rash 04/10/2011   Neosporin [neomycin-polymyxin-gramicidin] Rash 04/24/2011    Vitals: BP 121/84 (BP Location: Left Arm, Patient Position: Sitting)    Pulse 64    Temp (!) 97.5 F (36.4 C) Comment: taken by front staff   Ht 5' 5.5" (1.664 m)    Wt 149 lb (67.6 kg)    BMI 24.42 kg/m  Last Weight:  Wt Readings from Last 1 Encounters:  08/13/18 149 lb (67.6 kg)   Last Height:   Ht Readings from Last 1 Encounters:  08/13/18 5' 5.5" (1.664 m)     Physical exam: Exam: Gen: NAD, conversant, well nourised, obese, well groomed                     CV: RRR, no MRG. No Carotid Bruits. No peripheral edema, warm, nontender Eyes: Conjunctivae clear without exudates or hemorrhage  Neuro: Detailed Neurologic Exam  Speech:    Speech is normal; fluent and spontaneous with normal comprehension.  Cognition:    The patient is oriented to person, place, and time;     recent and remote memory intact;     language fluent;     normal attention, concentration,     fund of knowledge Cranial Nerves:    The pupils are equal, round, and reactive to light. The fundi are normal and spontaneous venous pulsations are present. Visual fields are full to finger confrontation. Extraocular movements are intact. Trigeminal sensation is intact and the muscles of mastication are normal. The face is symmetric. The  palate elevates in the midline. Hearing intact. Voice is normal. Shoulder shrug is normal. The tongue has normal motion without fasciculations.   Coordination:    Normal finger to nose and heel to shin. Normal rapid alternating movements.   Gait:    Heel-toe and tandem gait are normal.   Motor Observation:    No asymmetry, no atrophy, and no involuntary movements noted. Tone:    Normal muscle tone.    Posture:    Posture is normal. normal erect    Strength: triceps and distal ulnar muscles of hand weak 4/5, weakness of FDP.        Sensation: decreased distally in the hand     Reflex Exam:  DTR's: slightly decreased right triceps otherwise deep tendon reflexes in the upper and lower extremities are normal bilaterally.   Toes:    The toes are downgoing bilaterally.   Clonus:    Clonus is absent.  +Tinels at the elbow but not at wrist, neg phalen's maneuver    Assessment/Plan: This is a 53 year old patient with right hand pain since 2019.  She was evaluated by orthopedics and found to have a nodule present at the A1 pulley of the right finger with very mild triggering where her pain was located, otherwise exam and strength all normal in the hand and arm no evidence of radiculopathy at that time.  She was diagnosed with stenosing tenosynovitis of the right ring finger.  She had injections they did not help and pain has progressed.  She has been under the care of a physician for a year now, she initially saw orthopedics in July of last year for evaluation and treatment did not help, progressive, she is had physical therapy, she is tried analgesics, other conservative measures, exam shows focal findings of asymmetric triceps  reflexes, weakness of the triceps and distal finger flexors in the ulnar distribution, positive Tinel sign at the elbow, I suspect she may have cervical radiculopathy with possibly some ulnar entrapment as well or double crush syndrome.  Patient needs an MRI of the  cervical spine to evaluate for radiculopathy and surgical options given her progressive issues and also nerve conduction with EMG to examine any concurrent mononeuropathy such as carpal tunnel or ulnar neuropathy.  Addendum 09/02/2018: reviewed MRI cervical spine, Possible right C5-C6 impingement. I think this is causing her neck and arm symptoms but it does not correlate to the symptoms in the ulnar distribution of the right hand, so I think these are 2 separate issues. Sent her to Dr. Amanda PeaGramig and [facility].  Orders Placed This Encounter  Procedures   MR CERVICAL SPINE WO CONTRAST   NCV with EMG(electromyography)   Meds ordered this encounter  Medications   gabapentin (NEURONTIN) 300 MG capsule    Sig: Take 1 capsule (300 mg total) by mouth 3 (three) times daily.    Dispense:  90 capsule    Refill:  11    Cc: Henson, Zorita PangVickie L, NP-C,  PragueHenson, Zorita PangVickie L, NP-C  Naomie DeanAntonia Marlei Glomski, MD  [facility] [address] [place], [place] [id number]  Phone [phone] Fax [phone]

## 2018-08-17 ENCOUNTER — Telehealth: Payer: Self-pay | Admitting: Neurology

## 2018-08-17 NOTE — Telephone Encounter (Signed)
LVM for pt to call back about scheduling MRI  [employer] Auth: [id number] (exp. 08/13/18 to 02/08/19)

## 2018-08-17 NOTE — Telephone Encounter (Signed)
I spoke with Sharyn Lull, a representative for [facility], and she stated that pt is actually wanting to go to [facility] for her MRI and that the order would need to be faxed there. Their phone number is [phone] Fax [phone]

## 2018-08-17 NOTE — Telephone Encounter (Signed)
Noted, I faxed the order to [facility]. They will reach out to the patient to schedule.

## 2018-08-18 DIAGNOSIS — M47812 Spondylosis without myelopathy or radiculopathy, cervical region: Secondary | ICD-10-CM | POA: Diagnosis not present

## 2018-08-20 ENCOUNTER — Ambulatory Visit (INDEPENDENT_AMBULATORY_CARE_PROVIDER_SITE_OTHER): Payer: BC Managed Care – PPO | Admitting: Neurology

## 2018-08-20 ENCOUNTER — Other Ambulatory Visit: Payer: Self-pay

## 2018-08-20 DIAGNOSIS — G5621 Lesion of ulnar nerve, right upper limb: Secondary | ICD-10-CM

## 2018-08-20 DIAGNOSIS — R29898 Other symptoms and signs involving the musculoskeletal system: Secondary | ICD-10-CM

## 2018-08-20 DIAGNOSIS — M79601 Pain in right arm: Secondary | ICD-10-CM

## 2018-08-20 DIAGNOSIS — Z8261 Family history of arthritis: Secondary | ICD-10-CM

## 2018-08-20 DIAGNOSIS — M5412 Radiculopathy, cervical region: Secondary | ICD-10-CM

## 2018-08-20 DIAGNOSIS — Z0289 Encounter for other administrative examinations: Secondary | ICD-10-CM

## 2018-08-20 DIAGNOSIS — M4722 Other spondylosis with radiculopathy, cervical region: Secondary | ICD-10-CM | POA: Diagnosis not present

## 2018-08-20 NOTE — Patient Instructions (Signed)
Cubital Tunnel Syndrome  Cubital tunnel syndrome is a condition that causes pain and weakness of the forearm and hand. It happens when one of the nerves that runs along the inside of the elbow joint (ulnar nerve) becomes irritated. This condition is usually caused by repeated arm motions that are done during sports or work-related activities. What are the causes? This condition may be caused by:  Increased pressure on the ulnar nerve at the elbow, arm, or forearm. This can result from: ? Irritation caused by repeated elbow bending. ? Poorly healed elbow fractures. ? Tumors in the elbow. These are usually noncancerous (benign). ? Scar tissue that develops in the elbow after an injury. ? Bony growths (spurs) near the ulnar nerve.  Stretching of the nerve due to loose elbow ligaments.  Trauma to the nerve at the elbow. What increases the risk? The following factors may make you more likely to develop this condition:  Doing manual labor that requires frequent bending of the elbow.  Playing sports that include repeated or strenuous throwing motions, such as baseball.  Playing contact sports, such as football or lacrosse.  Not warming up properly before activities.  Having diabetes.  Having an underactive thyroid (hypothyroidism). What are the signs or symptoms? Symptoms of this condition include:  Clumsiness or weakness of the hand.  Tenderness of the inner elbow.  Aching or soreness of the inner elbow, forearm, or fingers, especially the little finger or the ring finger.  Increased pain when forcing the elbow to bend.  Reduced control when throwing objects.  Tingling, numbness, or a burning feeling inside the forearm or in part of the hand or fingers, especially the little finger or the ring finger.  Sharp pains that shoot from the elbow down to the wrist and hand.  The inability to grip or pinch hard. How is this diagnosed? This condition is diagnosed based on:  Your  symptoms and medical history. Your health care provider will also ask for details about any injury.  A physical exam. You may also have tests, including:  Electromyogram (EMG). This test measures electrical signals sent by your nerves into the muscles.  Nerve conduction study. This test measures how well electrical signals pass through your nerves.  Imaging tests, such as X-rays, ultrasound, and MRI. These tests check for possible causes of your condition. How is this treated? This condition may be treated by:  Stopping the activities that are causing your symptoms to get worse.  Icing and taking medicines to reduce pain and swelling.  Wearing a splint to prevent your elbow from bending, or wearing an elbow pad where the ulnar nerve is closest to the skin.  Working with a physical therapist in less severe cases. This may help to: ? Decrease your symptoms. ? Improve the strength and range of motion of your elbow, forearm, and hand. If these treatments do not help, surgery may be needed. Follow these instructions at home: If you have a splint:  Wear the splint as told by your health care provider. Remove it only as told by your health care provider.  Loosen the splint if your fingers tingle, become numb, or turn cold and blue.  Keep the splint clean.  If the splint is not waterproof: ? Do not let it get wet. ? Cover it with a watertight covering when you take a bath or shower. Managing pain, stiffness, and swelling   If directed, put ice on the injured area: ? Put ice in a plastic bag. ?   Place a towel between your skin and the bag. ? Leave the ice on for 20 minutes, 2-3 times a day.  Move your fingers often to avoid stiffness and to lessen swelling.  Raise (elevate) the injured area above the level of your heart while you are sitting or lying down. General instructions  Take over-the-counter and prescription medicines only as told by your health care provider.  Do any  exercise or physical therapy as told by your health care provider.  Do not drive or use heavy machinery while taking prescription pain medicine.  If you were given an elbow pad, wear it as told by your health care provider.  Keep all follow-up visits as told by your health care provider. This is important. Contact a health care provider if:  Your symptoms get worse.  Your symptoms do not get better with treatment.  You have new pain.  Your hand on the injured side feels numb or cold. Summary  Cubital tunnel syndrome is a condition that causes pain and weakness of the forearm and hand.  You are more likely to develop this condition if you do work or play sports that involve repeated arm movements.  This condition is often treated by stopping repetitive activities, applying ice, and using anti-inflammatory medicines.  In rare cases, surgery may be needed. This information is not intended to replace advice given to you by your health care provider. Make sure you discuss any questions you have with your health care provider. Document Released: 01/21/2005 Document Revised: 06/09/2017 Document Reviewed: 06/09/2017 Elsevier Patient Education  2020 [employer] Inc[employer]     Ulnar Nerve Transposition  Ulnar nerve transposition is a surgery to move the ulnar nerve. The surgery is done to treat a condition in which the ulnar nerve gets squeezed (compressed) behind the elbow (cubital tunnel syndrome). You may need this surgery if other treatments for cubital tunnel syndrome have not worked. The ulnar nerve starts in the neck, passes behind the elbow, and travels down to the hand. During an ulnar nerve transposition, your surgeon will move your ulnar nerve to the inside of the arm, so that the nerve passes in front of your elbow instead of behind your elbow. Tell a health care provider about:  Any allergies you have.  All medicines you are taking, including vitamins, herbs, eye drops, creams, and  over-the-counter medicines.  Any problems you or family members have had with anesthetic medicines.  Any blood disorders you have.  Any surgeries you have had.  Any medical conditions you have.  Whether you are pregnant or may be pregnant. What are the risks? Generally, this is a safe procedure. However, problems may occur, including:  Damage to the nerves, which can cause tingling, numbness, or weakness.  Persistent pain after surgery.  Bleeding.  Infection.  Damage to blood vessels.  Allergic reactions to anesthetics. What happens before the procedure? Hydration Follow instructions from your health care provider about hydration, which may include:  Up to 2 hours before the procedure - you may continue to drink clear liquids, such as water, clear fruit juice, black coffee, and plain tea.  Eating and drinking restrictions Follow instructions from your health care provider about eating and drinking, which may include:  8 hours before the procedure - stop eating heavy meals or foods, such as meat, fried foods, or fatty foods.  6 hours before the procedure - stop eating light meals or foods, such as toast or cereal.  6 hours before the procedure -  stop drinking milk or drinks that contain milk.  2 hours before the procedure - stop drinking clear liquids. Medicines Ask your health care provider about:  Changing or stopping your regular medicines. This is especially important if you are taking diabetes medicines or blood thinners.  Taking medicines such as aspirin and ibuprofen. These medicines can thin your blood. Do not take these medicines unless your health care provider tells you to take them.  Taking over-the-counter medicines, vitamins, herbs, and supplements. General instructions  Plan to have someone take you home from the hospital or clinic.  If you will be going home right after the procedure, plan to have someone with you for 24 hours.  Ask your health  care provider what steps will be taken to help prevent infection. These may include: ? Removing hair at the surgery site. ? Washing skin with a germ-killing soap. ? Taking antibiotic medicine. What happens during the procedure?   An IV will be inserted into one of your veins.  You will be given one or more of the following: ? A medicine to help you relax (sedative). ? A medicine to make you fall asleep (general anesthetic). ? A medicine that is injected into an area above your elbow to numb everything below the injection site (regional anesthetic).  Your surgeon will make an incision along the inside of your elbow.  The muscles beneath the skin will be moved out of the way to reveal the ulnar nerve.  The nerve will be lifted out of place and repositioned in front of your elbow. Your surgeon may place the nerve above or below the muscle layer.  The incision will be closed with stitches (sutures), staples, or surgical glue.  A bandage (dressing) will be placed over the incision. The procedure may vary among health care providers and hospitals. What happens after the procedure?  Your blood pressure, heart rate, breathing rate, and blood oxygen level will be monitored until you leave the hospital or clinic.  You will be given medicine to control pain.  You may have a splint to protect your arm while you heal.  You may have to wear a sling to support your arm and prevent it from moving.  Do not drive for 24 hours if you were given a sedative during your procedure. Summary  Ulnar nerve transposition is a surgery to move the ulnar nerve to a new position. This is done to release the nerve from being compressed.  Generally, this is a safe procedure. However, problems may occur, including nerve damage, pain, bleeding, infection, and damage to blood vessels.  Follow your health care provider's instructions before the procedure. You will be told when to stop eating and drinking, what  medicines to stop or change, and what steps will be taken to prevent infection.  After the procedure, you will be monitored and given medicines to control pain. This information is not intended to replace advice given to you by your health care provider. Make sure you discuss any questions you have with your health care provider. Document Released: 02/17/2015 Document Revised: 10/02/2017 Document Reviewed: 10/02/2017 Elsevier Patient Education  2020 [employer].

## 2018-08-20 NOTE — Progress Notes (Addendum)
I discussed findings with patient. She has right ulnar neuropathy as seen by emg/ncs findings as well as her clinical symptoms and positive Tinnel's sign at the elbow. Discussed conservative measures and referral to Dr. Amedeo Plenty for further treatment such as injections if possible.  - neuro OT for right ulnar neuropathy - Injections first if possible Dr. Amedeo Plenty then consider surgery and follow for MRI cervical spine results - may also have some cervical radic/arthritic changes and MRI is pending of the cervical spine to eval for cervical radiculopathy  Addendum 09/02/2018: reviewed MRI cervical spine, Possible right C5-C6 impingement. I think this is causing her neck and arm symptoms but it does not correlate to the symptoms in the ulnar distribution of the right hand, so I think these are 2 separate issues. Sent her to Dr. Amedeo Plenty and [facility].  Orders Placed This Encounter  Procedures  . Ambulatory referral to Physical Therapy  . Ambulatory referral to Occupational Therapy  . Ambulatory referral to Orthopedic Surgery   A total of 15 minutes was spent face-to-face with this patient. Over half this time was spent on counseling patient on the  1. Ulnar neuropathy at elbow, right   2. FHx: rheumatoid arthritis   3. Osteoarthritis of spine with radiculopathy, cervical region   4. Hand weakness   5. Pain of right upper extremity    diagnosis and different diagnostic and therapeutic options, counseling and coordination of care, risks ans benefits of management, compliance, or risk factor reduction and education.  This does not include time on emg/ncs.

## 2018-08-24 DIAGNOSIS — G5621 Lesion of ulnar nerve, right upper limb: Secondary | ICD-10-CM | POA: Insufficient documentation

## 2018-08-24 NOTE — Progress Notes (Signed)
      Full Name: Stacey Gardner Gender: Female MRN #: [id number] Date of Birth: 03/12/1965    Visit Date: 08/20/2018 07:47 Age: 52 Years 9 Months Old Examining Physician: Rozalyn Osland, MD  Referring Physician: Henson, Vickie L, NP-C  History: Patient with right hand weakness of the distal Ulnar-innervated finger flexors and sensory changes in the ulnar distribution, positiveTinel sign at the elbow.  Summary: EMG/NCS was performed on the bilateral upper extremities. All nerves and muscles (as indicated in the following tables) were within normal limits.   Conclusion: All findings technically within normal limits however the left-sided Ulnar motor and sensory conductions showed increased amplitudes as compared to the right-sided conductions. Given her clinical symptoms consistent with right Ulnar Neuropathy will refer to Orthopaedics for further evaluation for right ulnar Neuropathy at the elbow. MRI of the cervical spine has been ordered and is pending.   Reymundo Winship, M.D.  [facility] [address] [place], [place] [address] Tel: [phone] Fax: [phone]   cc: Henson, Vickie L, NP-C     MNC    Nerve / Sites Muscle Latency Ref. Amplitude Ref. Rel Amp Segments Distance Velocity Ref. Area    ms ms mV mV %  cm m/s m/s mVms  L Median - APB     Wrist APB 3.0 ?4.4 11.4 ?4.0 100 Wrist - APB 7   44.1     Upper arm APB 6.7  10.9  95.5 Upper arm - Wrist 22 59 ?49 43.2  R Median - APB     Wrist APB 3.2 ?4.4 11.1 ?4.0 100 Wrist - APB 7   41.8     Upper arm APB 7.1  10.8  97.2 Upper arm - Wrist 22 56 ?49 39.9  L Ulnar - ADM     Wrist ADM 2.1 ?3.3 10.1 ?6.0 100 Wrist - ADM 7   28.1     B.Elbow ADM 5.6  9.8  96.4 B.Elbow - Wrist 20 57 ?49 27.1     A.Elbow ADM 7.5  9.2  94 A.Elbow - B.Elbow 10 52 ?49 26.0         A.Elbow - Wrist      R Ulnar - ADM     Wrist ADM 2.1 ?3.3 8.3 ?6.0 100 Wrist - ADM 7   30.2     B.Elbow ADM 5.7  7.7  92.5 B.Elbow - Wrist 20 56 ?49  26.8     A.Elbow ADM 7.7  6.9  89.6 A.Elbow - B.Elbow 10 49 ?49 25.7         A.Elbow - Wrist      R Ulnar - FDI     Wrist FDI 3.0 ?4.5 9.9 ?7.0 100 Wrist - FDI 8   27.9     B.Elbow FDI 6.6  9.5  95.5 B.Elbow - Wrist 20 56 ?49 27.5     A.Elbow FDI 8.6  9.4  99 A.Elbow - B.Elbow 10 49 ?49 27.4         A.Elbow - Wrist      L Ulnar - FDI     Wrist FDI 3.6 ?4.5 10.9 ?7.0 100 Wrist - FDI 8   38.1     B.Elbow FDI 7.2  10.6  97 B.Elbow - Wrist 20 56 ?49 36.5     A.Elbow FDI 9.2  10.3  97.2 A.Elbow - B.Elbow 10 51 ?49 35.8         A.Elbow - Wrist                           [place]    Nerve / Sites Rec. Site Peak Lat Ref.  Amp Ref. Segments Distance Peak Diff Ref.    ms ms V V  cm ms ms  L Median, Ulnar - Transcarpal comparison     Median Palm Wrist 1.8 ?2.2 47 ?35 Median Palm - Wrist 8       Ulnar Palm Wrist 1.9 ?2.2 29 ?12 Ulnar Palm - Wrist 8          Median Palm - Ulnar Palm  -0.1 ?0.4  R Median, Ulnar - Transcarpal comparison     Median Palm Wrist 2.1 ?2.2 42 ?35 Median Palm - Wrist 8       Ulnar Palm Wrist 2.0 ?2.2 17 ?12 Ulnar Palm - Wrist 8          Median Palm - Ulnar Palm  0.1 ?0.4  L Median - Orthodromic (Dig II, Mid palm)     Dig II Wrist 2.6 ?3.4 15 ?10 Dig II - Wrist 13    R Median - Orthodromic (Dig II, Mid palm)     Dig II Wrist 2.9 ?3.4 15 ?10 Dig II - Wrist 13    L Ulnar - Orthodromic, (Dig V, Mid palm)     Dig V Wrist 2.6 ?3.1 11 ?5 Dig V - Wrist 11    R Ulnar - Orthodromic, (Dig V, Mid palm)     Dig V Wrist 2.7 ?3.1 6 ?5 Dig V - Wrist 6                   F  Wave    Nerve F Lat Ref.   ms ms  L Ulnar - ADM 26.0 ?32.0  R Ulnar - ADM 26.5 ?32.0         EMG full       EMG Summary Table    Spontaneous MUAP Recruitment  Muscle IA Fib PSW Fasc Other Amp Dur. Poly Pattern  R. Deltoid Normal None None None _______ Normal Normal Normal Normal  R. Triceps brachii Normal None None None _______ Normal Normal Normal Normal  R. Pronator teres Normal None None None _______  Normal Normal Normal Normal  R. First dorsal interosseous Normal None None None _______ Normal Normal Normal Normal  R. Cervical paraspinals (low) Normal None None None _______ Normal Normal Normal Normal  R. Flexor digitorum profundus (Ulnar) Normal None None None _______ Normal Normal Normal Normal  R. Opponens pollicis Normal None None None _______ Normal Normal Normal Normal

## 2018-08-24 NOTE — Progress Notes (Signed)
See procedure note.

## 2018-08-26 NOTE — Procedures (Signed)
Full Name: Stacey BeaversMercedes Gardner Gender: Female MRN #: [id number] Date of Birth: 12/25/1965    Visit Date: 08/20/2018 07:47 Age: 53 Years 9 Months Old Examining Physician: Stacey DeanAntonia Amari Burnsworth, MD  Referring Physician: Avanell ShackletonHenson, Vickie L, NP-C  History: Patient with right hand weakness of the distal Ulnar-innervated finger flexors and sensory changes in the ulnar distribution, positiveTinel sign at the elbow.  Summary: EMG/NCS was performed on the bilateral upper extremities. All nerves and muscles (as indicated in the following tables) were within normal limits.   Conclusion: All findings technically within normal limits however the left-sided Ulnar motor and sensory conductions showed increased amplitudes as compared to the right-sided conductions. Given her clinical symptoms consistent with right Ulnar Neuropathy will refer to Orthopaedics for further evaluation for right ulnar Neuropathy at the elbow. MRI of the cervical spine has been ordered and is pending.   Stacey DeanAntonia Donalyn Gardner, M.D.  [facility] [address] [place], [place] [address] Tel: [phone] Fax: [phone]   cc: Henson, Vickie L, NP-C     MNC    Nerve / Sites Muscle Latency Ref. Amplitude Ref. Rel Amp Segments Distance Velocity Ref. Area    ms ms mV mV %  cm m/s m/s mVms  L Median - APB     Wrist APB 3.0 ?4.4 11.4 ?4.0 100 Wrist - APB 7   44.1     Upper arm APB 6.7  10.9  95.5 Upper arm - Wrist 22 59 ?49 43.2  R Median - APB     Wrist APB 3.2 ?4.4 11.1 ?4.0 100 Wrist - APB 7   41.8     Upper arm APB 7.1  10.8  97.2 Upper arm - Wrist 22 56 ?49 39.9  L Ulnar - ADM     Wrist ADM 2.1 ?3.3 10.1 ?6.0 100 Wrist - ADM 7   28.1     B.Elbow ADM 5.6  9.8  96.4 B.Elbow - Wrist 20 57 ?49 27.1     A.Elbow ADM 7.5  9.2  94 A.Elbow - B.Elbow 10 52 ?49 26.0         A.Elbow - Wrist      R Ulnar - ADM     Wrist ADM 2.1 ?3.3 8.3 ?6.0 100 Wrist - ADM 7   30.2     B.Elbow ADM 5.7  7.7  92.5 B.Elbow - Wrist 20 56 ?49  26.8     A.Elbow ADM 7.7  6.9  89.6 A.Elbow - B.Elbow 10 49 ?49 25.7         A.Elbow - Wrist      R Ulnar - FDI     Wrist FDI 3.0 ?4.5 9.9 ?7.0 100 Wrist - FDI 8   27.9     B.Elbow FDI 6.6  9.5  95.5 B.Elbow - Wrist 20 56 ?49 27.5     A.Elbow FDI 8.6  9.4  99 A.Elbow - B.Elbow 10 49 ?49 27.4         A.Elbow - Wrist      L Ulnar - FDI     Wrist FDI 3.6 ?4.5 10.9 ?7.0 100 Wrist - FDI 8   38.1     B.Elbow FDI 7.2  10.6  97 B.Elbow - Wrist 20 56 ?49 36.5     A.Elbow FDI 9.2  10.3  97.2 A.Elbow - B.Elbow 10 51 ?49 35.8         A.Elbow - Wrist  [place]    Nerve / Sites Rec. Site Peak Lat Ref.  Amp Ref. Segments Distance Peak Diff Ref.    ms ms V V  cm ms ms  L Median, Ulnar - Transcarpal comparison     Median Palm Wrist 1.8 ?2.2 47 ?35 Median Palm - Wrist 8       Ulnar Palm Wrist 1.9 ?2.2 29 ?12 Ulnar Palm - Wrist 8          Median Palm - Ulnar Palm  -0.1 ?0.4  R Median, Ulnar - Transcarpal comparison     Median Palm Wrist 2.1 ?2.2 42 ?35 Median Palm - Wrist 8       Ulnar Palm Wrist 2.0 ?2.2 17 ?12 Ulnar Palm - Wrist 8          Median Palm - Ulnar Palm  0.1 ?0.4  L Median - Orthodromic (Dig II, Mid palm)     Dig II Wrist 2.6 ?3.4 15 ?10 Dig II - Wrist 13    R Median - Orthodromic (Dig II, Mid palm)     Dig II Wrist 2.9 ?3.4 15 ?10 Dig II - Wrist 13    L Ulnar - Orthodromic, (Dig V, Mid palm)     Dig V Wrist 2.6 ?3.1 11 ?5 Dig V - Wrist 11    R Ulnar - Orthodromic, (Dig V, Mid palm)     Dig V Wrist 2.7 ?3.1 6 ?5 Dig V - Wrist 6                   F  Wave    Nerve F Lat Ref.   ms ms  L Ulnar - ADM 26.0 ?32.0  R Ulnar - ADM 26.5 ?32.0         EMG full       EMG Summary Table    Spontaneous MUAP Recruitment  Muscle IA Fib PSW Fasc Other Amp Dur. Poly Pattern  R. Deltoid Normal None None None _______ Normal Normal Normal Normal  R. Triceps brachii Normal None None None _______ Normal Normal Normal Normal  R. Pronator teres Normal None None None _______  Normal Normal Normal Normal  R. First dorsal interosseous Normal None None None _______ Normal Normal Normal Normal  R. Cervical paraspinals (low) Normal None None None _______ Normal Normal Normal Normal  R. Flexor digitorum profundus (Ulnar) Normal None None None _______ Normal Normal Normal Normal  R. Opponens pollicis Normal None None None _______ Normal Normal Normal Normal

## 2018-08-31 ENCOUNTER — Other Ambulatory Visit: Payer: Self-pay | Admitting: Family Medicine

## 2018-09-08 ENCOUNTER — Telehealth: Payer: Self-pay

## 2018-09-08 ENCOUNTER — Other Ambulatory Visit: Payer: Self-pay | Admitting: Neurology

## 2018-09-08 ENCOUNTER — Other Ambulatory Visit: Payer: Self-pay

## 2018-09-08 ENCOUNTER — Ambulatory Visit: Payer: BC Managed Care – PPO | Attending: Neurology | Admitting: Occupational Therapy

## 2018-09-08 DIAGNOSIS — M79601 Pain in right arm: Secondary | ICD-10-CM | POA: Diagnosis not present

## 2018-09-08 DIAGNOSIS — M542 Cervicalgia: Secondary | ICD-10-CM | POA: Insufficient documentation

## 2018-09-08 DIAGNOSIS — R208 Other disturbances of skin sensation: Secondary | ICD-10-CM | POA: Diagnosis not present

## 2018-09-08 DIAGNOSIS — M6281 Muscle weakness (generalized): Secondary | ICD-10-CM | POA: Insufficient documentation

## 2018-09-08 DIAGNOSIS — R278 Other lack of coordination: Secondary | ICD-10-CM | POA: Diagnosis not present

## 2018-09-08 DIAGNOSIS — M79602 Pain in left arm: Secondary | ICD-10-CM | POA: Insufficient documentation

## 2018-09-08 NOTE — Telephone Encounter (Signed)
Dr. Jaynee Eagles,   Thank you for this referral. I wanted to inform you that during today's O.T. evaluation for Rt cubital tunnel syndrome, she is also now reporting beginning symptoms (although milder) on Lt side.  I also thought she may benefit from P.T. evaluation to address neck (possibly some traction and neck stabalization ex's) while I focus more on the cubital tunnel syndrome. If you agree, can you please send a new P.T. order to address the neck either via Epic or fax.  Thank you, and if you have any questions, please feel free to call me at [place], OTR/L

## 2018-09-08 NOTE — Therapy (Signed)
[facility] [address] [place], [place], [address] Phone: [phone]   Fax:  [phone]  Occupational Therapy Evaluation  Patient Details  Name: Stacey Gardner MRN: [id number] Date of Birth: 09/02/1965 Referring Provider (OT): Dr. Naomie DeanAntonia Ahern   Encounter Date: 09/08/2018  OT End of Session - 09/08/18 1501    Visit Number  1    Number of Visits  12    Date for OT Re-Evaluation  10/23/18    Authorization Type  BC/BS PPO    OT Start Time  1015    OT Stop Time  1100    OT Time Calculation (min)  45 min    Activity Tolerance  Patient tolerated treatment well       Past Medical History:  Diagnosis Date  . Allergy   . Anemia   . Depression   . Hypothyroid   . Mixed hyperlipidemia 03/04/2016  . Vitamin D deficiency     Past Surgical History:  Procedure Laterality Date  . CERVICAL POLYPECTOMY      There were no vitals filed for this visit.  Subjective Assessment - 09/08/18 1023    Subjective   I feel like this is starting some on the Lt side too, but worse on Rt side    Pertinent History  OA of spine w/ cervical radiculopathy (C5-6 impingement on recent MRI) - Dr. Lucia GaskinsAhern feels this is separate from cubital tunnel syndrome, but now pt is beginning to feel ulnar neuropathy on Lt side too    Limitations  none    Currently in Pain?  Yes    Pain Score  6     Pain Location  Arm    Pain Orientation  Right    Pain Descriptors / Indicators  Numbness;Tightness;Tingling    Pain Type  Acute pain    Pain Radiating Towards  worse in the upper arm currently    Pain Onset  More than a month ago    Pain Frequency  Intermittent    Aggravating Factors   use    Pain Relieving Factors  elevate it        [facility] OT Assessment - 09/08/18 0001      Assessment   Medical Diagnosis  ulnar neuropathy RUE   Pt also has cervical radiculopathy, reports symptoms starting in LUE   Referring Provider (OT)  Dr. Naomie DeanAntonia Ahern    Onset Date/Surgical Date  --   June 2019 and has worsened since then   Hand Dominance  Right      Precautions   Precautions  None      Balance Screen   Has the patient fallen in the past 6 months  No    Has the patient had a decrease in activity level because of a fear of falling?   No    Is the patient reluctant to leave their home because of a fear of falling?   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 Pt reports difficulty opening jars. Increased pain after cooking      IADL   Shopping  Takes care of all shopping needs independently    Light Housekeeping  Performs light daily tasks such as dishwashing, bed making    Meal Prep  Plans, prepares and serves adequate meals independently   but w/ pain, difficulty opening jars   Tree surgeon Status  Independent      Written Expression   Dominant Hand  Right    Handwriting  100% legible   hand fatigues     Posture/Postural Control   Posture/Postural Control  No significant limitations      Sensation   Light Touch  Appears Intact    Additional Comments  Pt reports numbness/tingling along ulnar n. distribution and in palm. Pt also reports hypersensitivity Rt hand. Pt also reports beginning numbness Lt hand along ulnar n. distribution      Coordination   9 Hole Peg Test  Right;Left    Right 9 Hole Peg Test  19.71 sec    Left 9 Hole Peg Test  19.63 sec    Coordination  Pt can still  perform in hand manipulation, but does drop more Rt hand      Edema   Edema  none      ROM / Strength   AROM / PROM / Strength  AROM      AROM   Overall AROM Comments  BUE AROM WNL's except delayed finger adduction and only 90% full composite flexion Rt hand. Pt does have full intrinsic + position, but flat fist w/ slightly decreased PIP flexion and moderately decreased DIP flexion      Hand Function   Right Hand Grip (lbs)  41 lbs    Right Hand Lateral Pinch  18 lbs    Right Hand 3 Point Pinch  14 lbs    Left Hand Grip (lbs)  50     Left Hand Lateral Pinch  16 lbs    Left 3 point pinch  13 lbs                        OT Short Term Goals - 09/08/18 1507      OT SHORT TERM GOAL #1   Title  Independent with splint wear and care    Time  3    Period  Weeks    Status  New      OT SHORT TERM GOAL #2   Title  Pt to verbalize understanding of proper positioning and pain management strategies to reduce symptoms of pain and numbness    Time  3    Period  Weeks    Status  New      OT SHORT TERM GOAL #3   Title  Independent with coordination and putty HEP for Rt hand (possibly Lt hand to prevent further deficits)    Time  3    Period  Weeks    Status  New        OT Long Term Goals - 09/08/18 1508      OT LONG TERM GOAL #1   Title  Pt to report decreased overall pain and numbness distal RUE    Time  6    Period  Weeks    Status  New      OT LONG TERM GOAL #2   Title  Pt to improve RUE grip strength to 48 lbs or greater    Baseline  41 lbs (Lt = 50 lbs)    Time  6    Period  Weeks    Status  New      OT LONG TERM GOAL #3   Title  Pt to report increased ease with opening jars and chopping and use of RUE    Time  6    Period  Weeks    Status  New            Plan - 09/08/18 1503    Clinical Impression Statement  Pt is a 53 y.o. female who presents to O.T. for evaluation of Rt ulnar neuropathy (cubital tunnel syndrome). Pt also has radiculopathy  w/ impingement C5-C6 cervical spine per last neurology note. Pt also now reports beginning symptoms on Lt side as well but milder than Rt dominant UE.    Occupational performance deficits (Please refer to evaluation for details):  ADL's;IADL's    Body Structure / Function / Physical Skills  IADL;Strength;Coordination;FMC;UE functional use;Pain;Sensation    Rehab Potential  Good    Clinical Decision Making  Limited treatment options, no task modification necessary    Comorbidities Affecting Occupational Performance:  Presence of comorbidities impacting occupational performance    Comorbidities impacting occupational performance description:  CERVICAL RADICULOPATHY    Modification or Assistance to Complete Evaluation   Min-Moderate modification of tasks or assist with assess necessary to complete eval    OT Frequency  2x / week    OT Duration  6 weeks   may reduce to 1x/wk when appropriate   OT Treatment/Interventions  Self-care/ADL training;Therapeutic exercise;Aquatic Therapy;Ultrasound;Neuromuscular education;Splinting;Manual Therapy;Therapeutic activities;Coping strategies training;DME and/or AE instruction;Fluidtherapy;Moist Heat;Passive range of motion;Patient/family education    Plan  follow conservative protocol including wrist splint, f/u w/ possible P.T. referral for neck    Consulted and Agree with Plan of Care  Patient       Patient will benefit from skilled therapeutic intervention in order to improve the following deficits and impairments:   Body Structure / Function / Physical Skills: IADL, Strength, Coordination, FMC, UE functional use, Pain, Sensation       Visit Diagnosis: 1. Pain in right arm   2. Other disturbances of skin sensation   3. Muscle weakness (generalized)   4. Other lack of coordination       Problem List Patient Active Problem List   Diagnosis Date Noted  . Ulnar neuropathy at elbow, right 08/24/2018  . Neck pain 12/23/2017  . Vitamin D deficiency  03/04/2016  . Mixed hyperlipidemia 03/04/2016  . Muscle cramps 03/04/2016  . Paresthesias 03/04/2016  . Allergic rhinitis, cause unspecified 06/24/2013  . Hypothyroidism 06/24/2013    Kelli ChurnBallie, [place] Delio Johnson, OTR/L 09/08/2018, 3:10 PM  [facility] [address] [place], [place], [address] Phone: [phone]   Fax:  [phone]  Name: Stacey Gardner MRN: [id number] Date of Birth: 02/16/1965

## 2018-09-08 NOTE — Telephone Encounter (Signed)
Absolutely yes thanks you !!!

## 2018-09-16 ENCOUNTER — Ambulatory Visit: Payer: BC Managed Care – PPO | Admitting: Occupational Therapy

## 2018-09-16 ENCOUNTER — Other Ambulatory Visit: Payer: Self-pay

## 2018-09-16 DIAGNOSIS — R278 Other lack of coordination: Secondary | ICD-10-CM | POA: Diagnosis not present

## 2018-09-16 DIAGNOSIS — M79601 Pain in right arm: Secondary | ICD-10-CM

## 2018-09-16 DIAGNOSIS — M542 Cervicalgia: Secondary | ICD-10-CM | POA: Diagnosis not present

## 2018-09-16 DIAGNOSIS — R208 Other disturbances of skin sensation: Secondary | ICD-10-CM | POA: Diagnosis not present

## 2018-09-16 DIAGNOSIS — M6281 Muscle weakness (generalized): Secondary | ICD-10-CM | POA: Diagnosis not present

## 2018-09-16 DIAGNOSIS — M79602 Pain in left arm: Secondary | ICD-10-CM | POA: Diagnosis not present

## 2018-09-16 NOTE — Therapy (Signed)
[facility] [address] [place], [place], [address] Phone: [phone]   Fax:  [phone]  Occupational Therapy Treatment  Patient Details  Name: Stacey Gardner MRN: [id number] Date of Birth: 09/20/1965 Referring Provider (OT): Dr. Naomie DeanAntonia Ahern   Encounter Date: 09/16/2018  OT End of Session - 09/16/18 1421    Visit Number  2    Number of Visits  12    Date for OT Re-Evaluation  10/23/18    Authorization Type  BC/BS PPO    OT Start Time  0930    OT Stop Time  1015    OT Time Calculation (min)  45 min    Activity Tolerance  Patient tolerated treatment well       Past Medical History:  Diagnosis Date  . Allergy   . Anemia   . Depression   . Hypothyroid   . Mixed hyperlipidemia 03/04/2016  . Vitamin D deficiency     Past Surgical History:  Procedure Laterality Date  . CERVICAL POLYPECTOMY      There were no vitals filed for this visit.  Subjective Assessment - 09/16/18 0935    Subjective   I feel like I also have pain behind my shoulder (lateral to scapula)    Pertinent History  OA of spine w/ cervical radiculopathy (C5-6 impingement on recent MRI) - Dr. Lucia GaskinsAhern feels this is separate from cubital tunnel syndrome, but now pt is beginning to feel ulnar neuropathy on Lt side too    Limitations  none    Pain Score  8     Pain Location  Arm   and shoulder/trunk   Pain Orientation  Right    Pain Descriptors / Indicators  Tightness;Numbness;Heaviness   jabbing in hand   Pain Type  Acute pain    Pain Onset  More than a month ago    Pain Frequency  Constant    Aggravating Factors   use    Pain Relieving Factors  elevate it       Pt issued D-ring wrist cock up brace and reviewed wear and care (instructed to wear during day prn - for pain management and/or during aggravating activities). Issued for wrist due to some pain in long finger and in palm and to prevent overuse of wrist flexors.  Issued elbow  pad to wear over cubital tunnel during day.  Issued bean bag splint to prevent full elbow flexion for nocturnal wear. Will monitor if custom made elbow splint needs to be fabricated for pm wear.   Discussed proper body mechanics when lifting husbands wheelchair, and overall general good positioning and positions to avoid including: prolonged elbow flexion, repetitive elbow flexion/extension.   Pt issued ulnar nerve gliding exercises and performed x 10 reps. Pt cautioned these should not cause pain, only some neural tension/stretching.   Further discussed POC and how P.T. services will be beneficial for neck stabalization ex's and possible traction. Also discussed possible dry needling if P.T. deems appropriate after P.T. evaluation on 09/30/18.                       OT Short Term Goals - 09/16/18 1435      OT SHORT TERM GOAL #1   Title  Independent with splint wear and care    Time  3    Period  Weeks    Status  On-going      OT SHORT TERM GOAL #2   Title  Pt  to verbalize understanding of proper positioning and pain management strategies to reduce symptoms of pain and numbness    Time  3    Period  Weeks    Status  On-going      OT SHORT TERM GOAL #3   Title  Independent with coordination and putty HEP for Rt hand (possibly Lt hand to prevent further deficits)    Time  3    Period  Weeks    Status  New        OT Long Term Goals - 09/08/18 1508      OT LONG TERM GOAL #1   Title  Pt to report decreased overall pain and numbness distal RUE    Time  6    Period  Weeks    Status  New      OT LONG TERM GOAL #2   Title  Pt to improve RUE grip strength to 48 lbs or greater    Baseline  41 lbs (Lt = 50 lbs)    Time  6    Period  Weeks    Status  New      OT LONG TERM GOAL #3   Title  Pt to report increased ease with opening jars and chopping and use of RUE    Time  6    Period  Weeks    Status  New            Plan - 09/16/18 1422    Clinical  Impression Statement  Pt progressing with splinting needs and increased awareness for proper positioning and proper body mechanics when lifting pt's w/c.    Occupational performance deficits (Please refer to evaluation for details):  ADL's;IADL's    Body Structure / Function / Physical Skills  IADL;Strength;Coordination;FMC;UE functional use;Pain;Sensation    Rehab Potential  Good    Comorbidities impacting occupational performance description:  CERVICAL RADICULOPATHY    OT Frequency  2x / week    OT Duration  6 weeks    OT Treatment/Interventions  Self-care/ADL training;Therapeutic exercise;Aquatic Therapy;Ultrasound;Neuromuscular education;Splinting;Manual Therapy;Therapeutic activities;Coping strategies training;DME and/or AE instruction;Fluidtherapy;Moist Heat;Passive range of motion;Patient/family education    Plan  P.T. evaluation now scheduled, review ulnar n. gliding ex's and splint wear and care prn, consider light posterior sh girlde strengthening (sh. ext, rows, horizontal abd, ER), also try and schedule more P.T. visits after 09/30/18 with Juel Burrow, or Letta Moynahan and reduce O.T. to 1x/wk    Consulted and Agree with Plan of Care  Patient       Patient will benefit from skilled therapeutic intervention in order to improve the following deficits and impairments:   Body Structure / Function / Physical Skills: IADL, Strength, Coordination, FMC, UE functional use, Pain, Sensation       Visit Diagnosis: 1. Pain in right arm   2. Other disturbances of skin sensation       Problem List Patient Active Problem List   Diagnosis Date Noted  . Ulnar neuropathy at elbow, right 08/24/2018  . Neck pain 12/23/2017  . Vitamin D deficiency 03/04/2016  . Mixed hyperlipidemia 03/04/2016  . Muscle cramps 03/04/2016  . Paresthesias 03/04/2016  . Allergic rhinitis, cause unspecified 06/24/2013  . Hypothyroidism 06/24/2013    Carey Bullocks, OTR/L 09/16/2018, 2:35 PM  [place] [address] [place], [place], [address] Phone: [phone]   Fax:  [phone]  Name: Stacey Golomb MRN: [id number] Date of Birth: [birth year]

## 2018-09-18 ENCOUNTER — Ambulatory Visit: Payer: BC Managed Care – PPO | Admitting: Occupational Therapy

## 2018-09-21 ENCOUNTER — Ambulatory Visit: Payer: BC Managed Care – PPO | Admitting: Physical Therapy

## 2018-09-24 DIAGNOSIS — M25521 Pain in right elbow: Secondary | ICD-10-CM | POA: Diagnosis not present

## 2018-09-24 DIAGNOSIS — G5621 Lesion of ulnar nerve, right upper limb: Secondary | ICD-10-CM | POA: Diagnosis not present

## 2018-09-24 DIAGNOSIS — M25522 Pain in left elbow: Secondary | ICD-10-CM | POA: Diagnosis not present

## 2018-09-25 ENCOUNTER — Ambulatory Visit: Payer: BC Managed Care – PPO | Admitting: Occupational Therapy

## 2018-09-25 ENCOUNTER — Encounter: Payer: Self-pay | Admitting: Occupational Therapy

## 2018-09-25 ENCOUNTER — Other Ambulatory Visit: Payer: Self-pay

## 2018-09-25 DIAGNOSIS — R208 Other disturbances of skin sensation: Secondary | ICD-10-CM | POA: Diagnosis not present

## 2018-09-25 DIAGNOSIS — M6281 Muscle weakness (generalized): Secondary | ICD-10-CM

## 2018-09-25 DIAGNOSIS — R278 Other lack of coordination: Secondary | ICD-10-CM

## 2018-09-25 DIAGNOSIS — M79602 Pain in left arm: Secondary | ICD-10-CM | POA: Diagnosis not present

## 2018-09-25 DIAGNOSIS — M79601 Pain in right arm: Secondary | ICD-10-CM | POA: Diagnosis not present

## 2018-09-25 DIAGNOSIS — M542 Cervicalgia: Secondary | ICD-10-CM | POA: Diagnosis not present

## 2018-09-25 NOTE — Therapy (Signed)
[place] [address] [place], [place], [address] Phone: [phone]   Fax:  [phone]  Occupational Therapy Treatment  Patient Details  Name: Stacey Gardner MRN: [id number] Date of Birth: 10-Sep-1965 Referring Provider (OT): Dr. Sarina Ill   Encounter Date: 09/25/2018  OT End of Session - 09/25/18 1328    Visit Number  3    Number of Visits  12    Date for OT Re-Evaluation  10/23/18    Authorization Type  BC/BS PPO   (used 6-7 visits at [facility] therapy this year)--30 visit limit combined OT/PT Chiro (counts as 1 if on same day)    OT Start Time  1235    OT Stop Time  1317    OT Time Calculation (min)  42 min    Activity Tolerance  Patient tolerated treatment well    Behavior During Therapy  [facility] for tasks assessed/performed       Past Medical History:  Diagnosis Date  . Allergy   . Anemia   . Depression   . Hypothyroid   . Mixed hyperlipidemia 03/04/2016  . Vitamin D deficiency     Past Surgical History:  Procedure Laterality Date  . CERVICAL POLYPECTOMY      There were no vitals filed for this visit.  Subjective Assessment - 09/25/18 1327    Subjective   Pt reports that she saw Dr. Amedeo Plenty and he thinks that it is not that bad and that he thinks therapy will help.  He started me on vitamin B6 and predisone, (follow up in 6 months), new night splint with metal stay.  Pt reports that the ular elbow pad helps a lot.    Pertinent History  OA of spine w/ cervical radiculopathy (C5-6 impingement on recent MRI) - Dr. Jaynee Eagles feels this is separate from cubital tunnel syndrome, but now pt is beginning to feel ulnar neuropathy on Lt side too    Currently in Pain?  Yes    Pain Score  2     Pain Location  Arm    Pain Orientation  Right    Pain Descriptors / Indicators  Aching;Heaviness    Pain Type  Acute pain    Pain Onset  More than a month ago    Pain Frequency  Constant    Aggravating Factors   use     Pain Relieving Factors  elevating, wearing elbow pad         Reviewed ulnar nerve glides, pt needed mod cueing initially for proper technique, but then was able to perform independently.  Reviewed splint wear and activity modifications (avoiding heavy gripping, avoid prolonged/repetitive elbow flex, avoid pressure on elbow):  Discussed modifications for lifting, gardening, using Ipad, painting, cooking.  Pt verbalized understanding.         OT Short Term Goals - 09/16/18 1435      OT SHORT TERM GOAL #1   Title  Independent with splint wear and care    Time  3    Period  Weeks    Status  On-going      OT SHORT TERM GOAL #2   Title  Pt to verbalize understanding of proper positioning and pain management strategies to reduce symptoms of pain and numbness    Time  3    Period  Weeks    Status  On-going      OT SHORT TERM GOAL #3   Title  Independent with coordination and putty HEP for Rt  hand (possibly Lt hand to prevent further deficits)    Time  3    Period  Weeks    Status  New        OT Long Term Goals - 09/08/18 1508      OT LONG TERM GOAL #1   Title  Pt to report decreased overall pain and numbness distal RUE    Time  6    Period  Weeks    Status  New      OT LONG TERM GOAL #2   Title  Pt to improve RUE grip strength to 48 lbs or greater    Baseline  41 lbs (Lt = 50 lbs)    Time  6    Period  Weeks    Status  New      OT LONG TERM GOAL #3   Title  Pt to report increased ease with opening jars and chopping and use of RUE    Time  6    Period  Weeks    Status  New            Plan - 09/25/18 1330    Clinical Impression Statement  Pt progressing towards goals with improved understanding of ulnar nerve glides and positioning to decr pain.    Occupational performance deficits (Please refer to evaluation for details):  ADL's;IADL's    Body Structure / Function / Physical Skills  IADL;Strength;Coordination;FMC;UE functional use;Pain;Sensation    Rehab  Potential  Good    Comorbidities impacting occupational performance description:  CERVICAL RADICULOPATHY    OT Frequency  2x / week    OT Duration  6 weeks    OT Treatment/Interventions  Self-care/ADL training;Therapeutic exercise;Aquatic Therapy;Ultrasound;Neuromuscular education;Splinting;Manual Therapy;Therapeutic activities;Coping strategies training;DME and/or AE instruction;Fluidtherapy;Moist Heat;Passive range of motion;Patient/family education    Plan  consider light posterior sh girlde strengthening (sh. ext, rows, horizontal abd, ER), coordination/putty HEP, also try and schedule more P.T. visits after 09/30/18 with Orlena SheldonBrian, Christina, or Bufford LopeAudra, check STGs next week    Consulted and Agree with Plan of Care  Patient       Patient will benefit from skilled therapeutic intervention in order to improve the following deficits and impairments:   Body Structure / Function / Physical Skills: IADL, Strength, Coordination, FMC, UE functional use, Pain, Sensation       Visit Diagnosis: Pain in right arm  Other disturbances of skin sensation  Muscle weakness (generalized)  Other lack of coordination    Problem List Patient Active Problem List   Diagnosis Date Noted  . Ulnar neuropathy at elbow, right 08/24/2018  . Neck pain 12/23/2017  . Vitamin D deficiency 03/04/2016  . Mixed hyperlipidemia 03/04/2016  . Muscle cramps 03/04/2016  . Paresthesias 03/04/2016  . Allergic rhinitis, cause unspecified 06/24/2013  . Hypothyroidism 06/24/2013    [facility],Hatim Homann 09/25/2018, 1:32 PM  [place] [facility] [address] [place], [place], [address] Phone: [phone]   Fax:  [phone]  Name: Jerrye BeaversMercedes Roarty MRN: [id number] Date of Birth: 01/15/1966   Willa FraterAngela Karion Cudd, OTR/L [facility] [address] [place], [place]  [address] [phone] phone [phone] 09/25/18 1:35 PM

## 2018-09-28 ENCOUNTER — Ambulatory Visit: Payer: BC Managed Care – PPO | Admitting: Occupational Therapy

## 2018-09-28 ENCOUNTER — Encounter: Payer: Self-pay | Admitting: Occupational Therapy

## 2018-09-28 ENCOUNTER — Other Ambulatory Visit: Payer: Self-pay

## 2018-09-28 DIAGNOSIS — R278 Other lack of coordination: Secondary | ICD-10-CM

## 2018-09-28 DIAGNOSIS — M79601 Pain in right arm: Secondary | ICD-10-CM

## 2018-09-28 DIAGNOSIS — M6281 Muscle weakness (generalized): Secondary | ICD-10-CM | POA: Diagnosis not present

## 2018-09-28 DIAGNOSIS — R208 Other disturbances of skin sensation: Secondary | ICD-10-CM | POA: Diagnosis not present

## 2018-09-28 DIAGNOSIS — M542 Cervicalgia: Secondary | ICD-10-CM | POA: Diagnosis not present

## 2018-09-28 DIAGNOSIS — M79602 Pain in left arm: Secondary | ICD-10-CM | POA: Diagnosis not present

## 2018-09-28 NOTE — Therapy (Signed)
[facility] [address] [place], [place], [address] Phone: [phone]   Fax:  [phone]  Occupational Therapy Treatment  Patient Details  Name: Stacey BeaversMercedes Gardner MRN: [id number] Date of Birth: 09/27/1965 Referring Provider (OT): Dr. Naomie DeanAntonia Ahern   Encounter Date: 09/28/2018  OT End of Session - 09/28/18 1321    Visit Number  4    Number of Visits  12    Date for OT Re-Evaluation  10/23/18    Authorization Type  BC/BS PPO   ([facility]30 visit limit combined OT/PT Chiro (counts as 1 if on same day)    OT Start Time  1231    OT Stop Time  1315    OT Time Calculation (min)  44 min    Activity Tolerance  Patient tolerated treatment well       Past Medical History:  Diagnosis Date  . Allergy   . Anemia   . Depression   . Hypothyroid   . Mixed hyperlipidemia 03/04/2016  . Vitamin D deficiency     Past Surgical History:  Procedure Laterality Date  . CERVICAL POLYPECTOMY      There were no vitals filed for this visit.  Subjective Assessment - 09/28/18 1236    Subjective   My pain is better!    Pertinent History  OA of spine w/ cervical radiculopathy (C5-6 impingement on recent MRI) - Dr. Lucia GaskinsAhern feels this is separate from cubital tunnel syndrome, but now pt is beginning to feel ulnar neuropathy on Lt side too    Pain Score  1     Pain Location  Arm   elbow and palm of R hand   Pain Orientation  Right    Pain Descriptors / Indicators  Heaviness;Aching;Tightness    Pain Type  Acute pain    Pain Onset  More than a month ago    Pain Frequency  Constant    Aggravating Factors   using my arm    Pain Relieving Factors  the exercises really help, the elbow pad, the splints, I think the prednisone really helped too (pt started this last Thursday and pt will take for 3 weeks)                   OT Treatments/Exercises (OP) - 09/28/18 0001      Exercises   Exercises  Shoulder      Shoulder Exercises: Seated   Other Seated Exercises  Added to HEP for gentle strengthening using yellow theraband for shoulder extension, horizontal abduction, abduction, flexion and ER. Pt tolerated well with no pain and after instruction and practice able to return demonstrate.  Pt also able to demonstrate understanding on how to adjust resistance.  Pt had no pain with any exercise.              OT Education - 09/28/18 1320    Education Details  added to HEP for proximal UE strength    Person(s) Educated  Patient    Methods  Explanation;Demonstration;Verbal cues;Handout    Comprehension  Verbalized understanding;Returned demonstration       OT Short Term Goals - 09/16/18 1435      OT SHORT TERM GOAL #1   Title  Independent with splint wear and care    Time  3    Period  Weeks    Status  On-going      OT SHORT TERM GOAL #2   Title  Pt  to verbalize understanding of proper positioning and pain management strategies to reduce symptoms of pain and numbness    Time  3    Period  Weeks    Status  On-going      OT SHORT TERM GOAL #3   Title  Independent with coordination and putty HEP for Rt hand (possibly Lt hand to prevent further deficits)    Time  3    Period  Weeks    Status  New        OT Long Term Goals - 09/08/18 1508      OT LONG TERM GOAL #1   Title  Pt to report decreased overall pain and numbness distal RUE    Time  6    Period  Weeks    Status  New      OT LONG TERM GOAL #2   Title  Pt to improve RUE grip strength to 48 lbs or greater    Baseline  41 lbs (Lt = 50 lbs)    Time  6    Period  Weeks    Status  New      OT LONG TERM GOAL #3   Title  Pt to report increased ease with opening jars and chopping and use of RUE    Time  6    Period  Weeks    Status  New            Plan - 09/28/18 1320    Clinical Impression Statement  Pt reports less pain today and tolerated session well.    Occupational performance  deficits (Please refer to evaluation for details):  ADL's;IADL's    Body Structure / Function / Physical Skills  IADL;Strength;Coordination;FMC;UE functional use;Pain;Sensation    Clinical Decision Making  Limited treatment options, no task modification necessary    Comorbidities Affecting Occupational Performance:  Presence of comorbidities impacting occupational performance    Comorbidities impacting occupational performance description:  CERVICAL RADICULOPATHY    Modification or Assistance to Complete Evaluation   Min-Moderate modification of tasks or assist with assess necessary to complete eval    OT Frequency  2x / week    OT Duration  6 weeks    OT Treatment/Interventions  Self-care/ADL training;Therapeutic exercise;Aquatic Therapy;Ultrasound;Neuromuscular education;Splinting;Manual Therapy;Therapeutic activities;Coping strategies training;DME and/or AE instruction;Fluidtherapy;Moist Heat;Passive range of motion;Patient/family education    Plan  check HEP for proximal strenghtening, coordination/putty HEP, also try and schedule more P.T. visits after 09/30/18 with Orlena SheldonBrian, Christina, or Bufford LopeAudra, check STGs next week    Consulted and Agree with Plan of Care  Patient       Patient will benefit from skilled therapeutic intervention in order to improve the following deficits and impairments:   Body Structure / Function / Physical Skills: IADL, Strength, Coordination, FMC, UE functional use, Pain, Sensation       Visit Diagnosis: Pain in right arm  Other disturbances of skin sensation  Muscle weakness (generalized)  Other lack of coordination    Problem List Patient Active Problem List   Diagnosis Date Noted  . Ulnar neuropathy at elbow, right 08/24/2018  . Neck pain 12/23/2017  . Vitamin D deficiency 03/04/2016  . Mixed hyperlipidemia 03/04/2016  . Muscle cramps 03/04/2016  . Paresthesias 03/04/2016  . Allergic rhinitis, cause unspecified 06/24/2013  . Hypothyroidism 06/24/2013     Norton PastelPulaski,  Halliday, OTR/L 09/28/2018, 1:23 PM  [facility] [address] [place], [place], [address] Phone: [phone]   Fax:  [phone]  Name: Dalaya Suppa MRN: [id number] Date of Birth: 08/19/1965

## 2018-09-28 NOTE — Patient Instructions (Signed)
Shoulder extension Horizontal abduction Shoulder flexion to 90 Shoulder abduction to 90 ER

## 2018-09-30 ENCOUNTER — Other Ambulatory Visit: Payer: Self-pay

## 2018-09-30 ENCOUNTER — Ambulatory Visit: Payer: BC Managed Care – PPO | Admitting: Physical Therapy

## 2018-09-30 ENCOUNTER — Ambulatory Visit: Payer: BC Managed Care – PPO | Admitting: Occupational Therapy

## 2018-09-30 ENCOUNTER — Encounter: Payer: Self-pay | Admitting: Physical Therapy

## 2018-09-30 DIAGNOSIS — M542 Cervicalgia: Secondary | ICD-10-CM | POA: Diagnosis not present

## 2018-09-30 DIAGNOSIS — M79601 Pain in right arm: Secondary | ICD-10-CM

## 2018-09-30 DIAGNOSIS — M79602 Pain in left arm: Secondary | ICD-10-CM

## 2018-09-30 DIAGNOSIS — R208 Other disturbances of skin sensation: Secondary | ICD-10-CM | POA: Diagnosis not present

## 2018-09-30 DIAGNOSIS — R278 Other lack of coordination: Secondary | ICD-10-CM | POA: Diagnosis not present

## 2018-09-30 DIAGNOSIS — M6281 Muscle weakness (generalized): Secondary | ICD-10-CM | POA: Diagnosis not present

## 2018-09-30 NOTE — Therapy (Signed)
[facility] [address] [place], [place], [address] Phone: [phone]   Fax:  [phone]  Physical Therapy Evaluation  Patient Details  Name: Stacey Gardner MRN: [id number] Date of Birth: 08/08/1965 Referring Provider (PT): Anson Fret, MD   Encounter Date: 09/30/2018  PT End of Session - 09/30/18 1022    Visit Number  1    Number of Visits  12    Date for PT Re-Evaluation  11/11/18    Authorization Type  BCBS    PT Start Time  0850    PT Stop Time  0950    PT Time Calculation (min)  60 min    Activity Tolerance  Patient tolerated treatment well    Behavior During Therapy  [facility] for tasks assessed/performed       Past Medical History:  Diagnosis Date  . Allergy   . Anemia   . Depression   . Hypothyroid   . Mixed hyperlipidemia 03/04/2016  . Vitamin D deficiency     Past Surgical History:  Procedure Laterality Date  . CERVICAL POLYPECTOMY      There were no vitals filed for this visit.   Subjective Assessment - 09/30/18 0851    Subjective  She relays neck pain and tightness for last 9 months. She is currently being seen by OT for bilat cubital tunnell syndrome. She has pain and difficulty with neck ROM, gripping, lifting, and carrying, and reaching, and sleeping. She complains of her Right hand and fingers being numb today.    Pertinent History  Cubital tunnel syndrome    Diagnostic tests  OA of spine w/ cervical radiculopathy (Rt C5-6 impingement on recent MRI)    Patient Stated Goals  get the pain better    Currently in Pain?  Yes    Pain Location  Neck    Pain Orientation  Right;Left   Right side is worse than Left   Pain Radiating Towards  worse down Rt arm but does have occasional Lt arm    Pain Onset  More than a month ago    Pain Frequency  Intermittent    Aggravating Factors   see above    Pain Relieving Factors  resting and laying down with neck support         [facility] PT  Assessment - 09/30/18 0001      Assessment   Medical Diagnosis  neck pain with radiculopathy    Referring Provider (PT)  Anson Fret, MD    Onset Date/Surgical Date  --   9 month onset of pain per pt report   Hand Dominance  Right    Next MD Visit  not until therapy is complete    Prior Therapy  OT for cubital tunnel      Precautions   Precautions  None      Prior Function   Level of Independence  Independent    Vocation  Unemployed    Vocation Requirements  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)      Cognition   Overall Cognitive Status  Within Functional Limits for tasks assessed      Sensation   Light Touch  Appears Intact    Additional Comments  Pt reports Numbness in ulnar and median nerve distribution      ROM / Strength   AROM / PROM / Strength  AROM;Strength      AROM   AROM Assessment Site  Cervical  Cervical Flexion  WNL    Cervical Extension  50%    Cervical - Right Side Bend  50%    Cervical - Left Side Bend  50%    Cervical - Right Rotation  WNL    Cervical - Left Rotation  WNL      Strength   Overall Strength Comments  UE strength WFL except for Rt shoulder IR/ER 4/5, and grip 4/5 on Rt, LT side is 5/5      Palpation   Spinal mobility  Decreased mid to upper T spine and lower C spine mobility    Palpation comment  TTP with central PA mobs and unilat to Rt at lower cervical and upper T spine that caused referred pain down her Rt arm      Special Tests   Other special tests  + spurlings test for neural compression, some relief with manual and mechanical traction      Transfers   Transfers  Independent with all Transfers                Objective measurements completed on examination: See above findings.      [place] [facility] PT Treatment/Exercise - 09/30/18 0001      Exercises   Exercises  Neck      Modalities   Modalities  Traction      Traction   Type of Traction  Cervical    Max (lbs)  10    Time   10 min static hold with saunders unit      Manual Therapy   Manual therapy comments  C-T spine mobs grade 1-2 central PA and unilat on Rt, manual cervical traction trial which did not cause pain so progressed to mechanical traction             PT Education - 09/30/18 1022    Education Details  HEP, exam findings, traction rationale    Person(s) Educated  Patient    Methods  Explanation;Demonstration;Verbal cues;Handout    Comprehension  Verbalized understanding;Returned demonstration;Need further instruction          PT Long Term Goals - 09/30/18 1025      PT LONG TERM GOAL #1   Title  Pt will be I and compliant with HEP. (Target goal for all goals 6 weeks 11/11/18)    Status  New      PT LONG TERM GOAL #2   Title  Pt will improve neck ROM to [facility] to improve mobility    Baseline  50% limited with extension, and side bending    Status  New      PT LONG TERM GOAL #3   Title  Pt will improve overall activity tolerance for funcitonal use of her arms and report improved sleeping quality.    Status  New      PT LONG TERM GOAL #4   Title  Pt will improve strength to at least 5-/5 MMT to improve function    Baseline  4/5 shoulder IR/ER and grip    Status  New      PT LONG TERM GOAL #5   Title  Pt will report decreased radiculopathy and pain overall in her arms and hands    Status  New             Plan - 09/30/18 1601    Clinical Impression Statement  Pt presents with Rt sided cervical radiculopathy C5-6 confirmed on MRI. She is also being treated for cubital tunnel syndrome  with OT (MD thinks this may be 2 seperate issues). Evaluation today suggests neck is involved and may be contributing to her UE radiculopathy due to positive spurlings test and when C5-6 is mobilbzed with central PA mobs this causes radiculopathy down the arm. Overall she has decreased neck ROM, decreaesd UE strength, Rt arm radiculopathy, and increased pain and difficulty with ADLs. She will benefit  from skilled PT to address these deficits.    Personal Factors and Comorbidities  Comorbidity 1    Comorbidities  cubital tunnel syndrome    Examination-Activity Limitations  Carry;Sleep;Lift;Caring for Others    Examination-Participation Restrictions  Meal Prep;Driving;Laundry    Stability/Clinical Decision Making  Evolving/Moderate complexity    Clinical Decision Making  Moderate    PT Frequency  2x / week    PT Duration  6 weeks    PT Treatment/Interventions  ADLs/Self Care Home Management;Cryotherapy;Electrical Stimulation;Iontophoresis 4mg /ml Dexamethasone;Moist Heat;Traction;Ultrasound;Therapeutic activities;Therapeutic exercise;Neuromuscular re-education;Manual techniques;Passive range of motion;Dry needling;Spinal Manipulations;Joint Manipulations;Taping    PT Next Visit Plan  assess her response to traction and HEP, may need to review HEP with her, how was sleeping with towel roll.    PT Home Exercise Plan  Access Code: [email or web address]    Recommended Other Services  continuing OT       Patient will benefit from skilled therapeutic intervention in order to improve the following deficits and impairments:  Decreased activity tolerance, Decreased strength, Decreased mobility, Hypomobility, Increased fascial restricitons, Increased muscle spasms, Impaired flexibility, Pain  Visit Diagnosis: Cervicalgia  Pain in right arm  Pain in left arm     Problem List Patient Active Problem List   Diagnosis Date Noted  . Ulnar neuropathy at elbow, right 08/24/2018  . Neck pain 12/23/2017  . Vitamin D deficiency 03/04/2016  . Mixed hyperlipidemia 03/04/2016  . Muscle cramps 03/04/2016  . Paresthesias 03/04/2016  . Allergic rhinitis, cause unspecified 06/24/2013  . Hypothyroidism 06/24/2013    April MansonBrian R Jermain Curt, PT,DPT 09/30/2018, 10:31 AM  [facility] [address] [place], [place], [address] Phone: [phone]   Fax:   [phone]  Name: Stacey BeaversMercedes Gardner MRN: [id number] Date of Birth: 01/18/1966

## 2018-09-30 NOTE — Patient Instructions (Signed)
Access Code: [email or web address]  URL: [email or web address]  Date: 09/30/2018  Prepared by: Elsie Ra   Exercises  Seated Cervical Retraction - 10 reps - 3 sets - 2x daily - 6x weekly  Seated Levator Scapulae Stretch - 3 sets - 30 hold - 2x daily - 6x weekly  Seated Cervical Sidebending Stretch - 3 sets - 30 hold - 2x daily - 6x weekly  Sidelying Upper Thoracic Rotation - 10 reps - 3 sets - 2x daily - 6x weekly  Cervical Extension AROM with Strap - 10 reps - 1-2 sets - 5 hold - 2x daily - 6x weekly  Seated Assisted Cervical Rotation with Towel - 10 reps - 1-2 sets - 5 hold - 2x daily - 6x weekly

## 2018-10-05 ENCOUNTER — Other Ambulatory Visit: Payer: Self-pay

## 2018-10-05 ENCOUNTER — Ambulatory Visit: Payer: BC Managed Care – PPO | Admitting: Physical Therapy

## 2018-10-05 DIAGNOSIS — M79601 Pain in right arm: Secondary | ICD-10-CM | POA: Diagnosis not present

## 2018-10-05 DIAGNOSIS — M542 Cervicalgia: Secondary | ICD-10-CM

## 2018-10-05 DIAGNOSIS — R278 Other lack of coordination: Secondary | ICD-10-CM | POA: Diagnosis not present

## 2018-10-05 DIAGNOSIS — M79602 Pain in left arm: Secondary | ICD-10-CM | POA: Diagnosis not present

## 2018-10-05 DIAGNOSIS — M6281 Muscle weakness (generalized): Secondary | ICD-10-CM | POA: Diagnosis not present

## 2018-10-05 DIAGNOSIS — R208 Other disturbances of skin sensation: Secondary | ICD-10-CM | POA: Diagnosis not present

## 2018-10-05 NOTE — Therapy (Signed)
[facility] [address] [place], [place], [address] Phone: [phone]   Fax:  [phone]  Physical Therapy Treatment  Patient Details  Name: Stacey Gardner MRN: [id number] Date of Birth: Oct 15, 1965 Referring Provider (PT): Melvenia Beam, MD   Encounter Date: 10/05/2018  PT End of Session - 10/05/18 1049    Visit Number  2    Number of Visits  12    Date for PT Re-Evaluation  11/11/18    Authorization Type  BCBS    PT Start Time  1020    PT Stop Time  1100    PT Time Calculation (min)  40 min    Activity Tolerance  Patient tolerated treatment well    Behavior During Therapy  [facility] for tasks assessed/performed       Past Medical History:  Diagnosis Date  . Allergy   . Anemia   . Depression   . Hypothyroid   . Mixed hyperlipidemia 03/04/2016  . Vitamin D deficiency     Past Surgical History:  Procedure Laterality Date  . CERVICAL POLYPECTOMY      There were no vitals filed for this visit.  Subjective Assessment - 10/05/18 1049    Subjective  Relays the numbness and pain in her Right hand is much better, she tried sleeping with towel roll but did not like it. She thinks the exercises at home are helping with the pain        Treatment/Interventions  added mid trap, IR on Rt shoulder, and bilat ER all with red  X20 ea Mechanical Traction 10-15 lbs X 22min chin tucks X 20 Upper trap stretch 30 sec X 3 bilat   PT Long Term Goals - 09/30/18 1025      PT LONG TERM GOAL #1   Title  Pt will be I and compliant with HEP. (Target goal for all goals 6 weeks 11/11/18)    Status  New      PT LONG TERM GOAL #2   Title  Pt will improve neck ROM to [facility] to improve mobility    Baseline  50% limited with extension, and side bending    Status  New      PT LONG TERM GOAL #3   Title  Pt will improve overall activity tolerance for funcitonal use of her arms and report improved sleeping quality.    Status   New      PT LONG TERM GOAL #4   Title  Pt will improve strength to at least 5-/5 MMT to improve function    Baseline  4/5 shoulder IR/ER and grip    Status  New      PT LONG TERM GOAL #5   Title  Pt will report decreased radiculopathy and pain overall in her arms and hands    Status  New            Plan - 10/05/18 1454    Clinical Impression Statement  Pt appeared to have less Rt arm radiculopathy today and is responding well to traction and HEP. Traction continued today with increased pull. Added more scapular/postrural and RTC  strengthening today with good tolerance.    PT Frequency  2x / week    PT Duration  6 weeks    PT Treatment/Interventions  ADLs/Self Care Home Management;Cryotherapy;Electrical Stimulation;Iontophoresis 4mg /ml Dexamethasone;Moist Heat;Traction;Ultrasound;Therapeutic activities;Therapeutic exercise;Neuromuscular re-education;Manual techniques;Passive range of motion;Dry needling;Spinal Manipulations;Joint Manipulations;Taping    PT Next Visit Plan  assess her response to traction  and HEP, consider manual therapy for cervical    PT Home Exercise Plan  Access Code: [email or web address], added bilat ER and mid trap/H abd with red       Patient will benefit from skilled therapeutic intervention in order to improve the following deficits and impairments:     Visit Diagnosis: Cervicalgia  Pain in right arm  Pain in left arm  Muscle weakness (generalized)     Problem List Patient Active Problem List   Diagnosis Date Noted  . Ulnar neuropathy at elbow, right 08/24/2018  . Neck pain 12/23/2017  . Vitamin D deficiency 03/04/2016  . Mixed hyperlipidemia 03/04/2016  . Muscle cramps 03/04/2016  . Paresthesias 03/04/2016  . Allergic rhinitis, cause unspecified 06/24/2013  . Hypothyroidism 06/24/2013    Birdie RiddleBrian R ,PT,DPT 10/05/2018, 2:58 PM  [facility] [address] [place], [place],  [address] Phone: [phone]   Fax:  [phone]  Name: Stacey Gardner MRN: [id number] Date of Birth: 04/05/1965

## 2018-10-07 ENCOUNTER — Ambulatory Visit: Payer: BC Managed Care – PPO | Admitting: Physical Therapy

## 2018-10-07 ENCOUNTER — Ambulatory Visit: Payer: BC Managed Care – PPO | Admitting: Occupational Therapy

## 2018-10-14 ENCOUNTER — Ambulatory Visit: Payer: BC Managed Care – PPO | Attending: Neurology | Admitting: Occupational Therapy

## 2018-10-14 ENCOUNTER — Other Ambulatory Visit: Payer: Self-pay

## 2018-10-14 ENCOUNTER — Ambulatory Visit: Payer: BC Managed Care – PPO | Admitting: Physical Therapy

## 2018-10-14 DIAGNOSIS — R208 Other disturbances of skin sensation: Secondary | ICD-10-CM | POA: Diagnosis not present

## 2018-10-14 DIAGNOSIS — R278 Other lack of coordination: Secondary | ICD-10-CM | POA: Diagnosis not present

## 2018-10-14 DIAGNOSIS — M79602 Pain in left arm: Secondary | ICD-10-CM

## 2018-10-14 DIAGNOSIS — M6281 Muscle weakness (generalized): Secondary | ICD-10-CM | POA: Diagnosis not present

## 2018-10-14 DIAGNOSIS — M542 Cervicalgia: Secondary | ICD-10-CM | POA: Diagnosis not present

## 2018-10-14 DIAGNOSIS — M79601 Pain in right arm: Secondary | ICD-10-CM | POA: Diagnosis not present

## 2018-10-14 NOTE — Therapy (Signed)
[facility] [address] [place], [place], [address] Phone: [phone]   Fax:  [phone]  Occupational Therapy Treatment  Patient Details  Name: Stacey Gardner MRN: [id number] Date of Birth: Nov 27, 1965 Referring Provider (OT): Dr. Sarina Ill   Encounter Date: 10/14/2018  OT End of Session - 10/14/18 1019    Visit Number  5    Number of Visits  12    Date for OT Re-Evaluation  10/23/18    Authorization Type  BC/BS PPO   (used 6-7 visits at [facility] therapy this year)--30 visit limit combined OT/PT [place] (counts as 1 if on same day)    OT Start Time  0930    OT Stop Time  1015    OT Time Calculation (min)  45 min    Activity Tolerance  Patient tolerated treatment well    Behavior During Therapy  [facility] for tasks assessed/performed       Past Medical History:  Diagnosis Date  . Allergy   . Anemia   . Depression   . Hypothyroid   . Mixed hyperlipidemia 03/04/2016  . Vitamin D deficiency     Past Surgical History:  Procedure Laterality Date  . CERVICAL POLYPECTOMY      There were no vitals filed for this visit.  Subjective Assessment - 10/14/18 0938    Subjective   Dr. Amedeo Plenty said he didn't think surgery or shots were needed, that therapy should help symptoms    Pertinent History  OA of spine w/ cervical radiculopathy (C5-6 impingement on recent MRI) - Dr. Jaynee Eagles feels this is separate from cubital tunnel syndrome, but now pt is beginning to feel ulnar neuropathy on Lt side too    Limitations  none    Currently in Pain?  No/denies       Began assessing goals and progress to date. Reviewed proper positioning at night and during functional activities.  Grip strength Rt between 40-45 lbs  Re-issued theraband HEP w/ pictures and pt performed each x 10 reps. Discussed recommended frequency and proper body mechanics/positioning while performing - see pt instructions for details.                      OT Education - 10/14/18 0948    Education Details  Theraband HEP with pictures    Person(s) Educated  Patient    Methods  Explanation;Demonstration;Verbal cues;Handout    Comprehension  Verbalized understanding;Returned demonstration       OT Short Term Goals - 10/14/18 1017      OT SHORT TERM GOAL #1   Title  Independent with splint wear and care    Time  3    Period  Weeks    Status  Achieved      OT SHORT TERM GOAL #2   Title  Pt to verbalize understanding of proper positioning and pain management strategies to reduce symptoms of pain and numbness    Time  3    Period  Weeks    Status  Achieved      OT SHORT TERM GOAL #3   Title  Independent with coordination and putty HEP for Rt hand (possibly Lt hand to prevent further deficits)    Time  3    Period  Weeks    Status  New        OT Long Term Goals - 10/14/18 1018      OT LONG TERM GOAL #1   Title  Pt  to report decreased overall pain and numbness distal RUE    Time  6    Period  Weeks    Status  Achieved      OT LONG TERM GOAL #2   Title  Pt to improve RUE grip strength to 48 lbs or greater    Baseline  41 lbs (Lt = 50 lbs)    Time  6    Period  Weeks    Status  New      OT LONG TERM GOAL #3   Title  Pt to report increased ease with opening jars and chopping and use of RUE    Time  6    Period  Weeks    Status  On-going            Plan - 10/14/18 1019    Clinical Impression Statement  Pt w/ greater understanding of theraband HEP with pictures provided and review    Occupational performance deficits (Please refer to evaluation for details):  ADL's;IADL's    Body Structure / Function / Physical Skills  IADL;Strength;Coordination;FMC;UE functional use;Pain;Sensation    Rehab Potential  Good    OT Frequency  2x / week    OT Duration  6 weeks    OT Treatment/Interventions  Self-care/ADL training;Therapeutic exercise;Aquatic Therapy;Ultrasound;Neuromuscular  education;Splinting;Manual Therapy;Therapeutic activities;Coping strategies training;DME and/or AE instruction;Fluidtherapy;Moist Heat;Passive range of motion;Patient/family education    Plan  coordination and putty HEP, then assess remaining STG    Consulted and Agree with Plan of Care  Patient       Patient will benefit from skilled therapeutic intervention in order to improve the following deficits and impairments:   Body Structure / Function / Physical Skills: IADL, Strength, Coordination, FMC, UE functional use, Pain, Sensation       Visit Diagnosis: Muscle weakness (generalized)  Other disturbances of skin sensation  Pain in right arm  Pain in left arm    Problem List Patient Active Problem List   Diagnosis Date Noted  . Ulnar neuropathy at elbow, right 08/24/2018  . Neck pain 12/23/2017  . Vitamin D deficiency 03/04/2016  . Mixed hyperlipidemia 03/04/2016  . Muscle cramps 03/04/2016  . Paresthesias 03/04/2016  . Allergic rhinitis, cause unspecified 06/24/2013  . Hypothyroidism 06/24/2013    Kelli ChurnBallie, Tanya Crothers Johnson, OTR/L 10/14/2018, 11:16 AM  [facility] [address] [place], [place], [address] Phone: [phone]   Fax:  [phone]  Name: Stacey BeaversMercedes Gardner MRN: [id number] Date of Birth: 08/25/1965

## 2018-10-14 NOTE — Patient Instructions (Signed)
   Strengthening: Resisted Flexion   Hold tubing with __Rt, then Lt___ arm(s) at side. Pull forward and up. Move shoulder through pain-free range of motion. Repeat __10__ times per set.  Do _2_ sessions per day , every other day   Strengthening: Resisted Extension   Hold tubing in _both____ hand(s) simultaneously. Pull arms back, elbows straight. Repeat _10___ times per set. Do _2___ sessions per day, every other day.   Resisted Horizontal Abduction: Bilateral   Sit or stand, tubing in both hands, arms out in front. Keeping arms straight, pinch shoulder blades together and stretch arms out. Repeat _10___ times per set. Do _2___ sessions per day, every other day.   Scapular Retraction: Bilateral    Facing anchor, pull arms back, bringing shoulder blades together. Repeat _10___ times per set.  Do _2___ sessions per day, every other day.  Resisted External Rotation: in Neutral - Bilateral    Sit or stand, tubing in both hands, elbows at sides, bent to 90, forearms forward. Pinch shoulder blades together and rotate forearms out. Keep elbows at sides. Repeat _10___ times per set.  Do _2___ sessions per day, every other day.

## 2018-10-14 NOTE — Therapy (Signed)
[facility] [address] [place], [place], [address] Phone: [phone]   Fax:  [phone]  Physical Therapy Treatment  Patient Details  Name: Stacey Gardner MRN: [id number] Date of Birth: 1965/11/05 Referring Provider (PT): Melvenia Beam, MD   Encounter Date: 10/14/2018  PT End of Session - 10/14/18 1006    Visit Number  3    Number of Visits  12    Date for PT Re-Evaluation  11/11/18    Authorization Type  BCBS    PT Start Time  0845    PT Stop Time  0930    PT Time Calculation (min)  45 min    Activity Tolerance  Patient tolerated treatment well    Behavior During Therapy  [facility] for tasks assessed/performed       Past Medical History:  Diagnosis Date  . Allergy   . Anemia   . Depression   . Hypothyroid   . Mixed hyperlipidemia 03/04/2016  . Vitamin D deficiency     Past Surgical History:  Procedure Laterality Date  . CERVICAL POLYPECTOMY      There were no vitals filed for this visit.  Subjective Assessment - 10/14/18 1005    Subjective  Neck pain better, I now only have intermittent pain, the exercises and traction machine are helping, less radiculopathy down the Rt arm and hand    Pertinent History  Cubital tunnel syndrome    Diagnostic tests  OA of spine w/ cervical radiculopathy (Rt C5-6 impingement on recent MRI)    Patient Stated Goals  get the pain better    Currently in Pain?  No/denies    Pain Onset  More than a month ago       Treatment and Interventions:  Mechanical traction with saunders unit 12 lbs static hold for 15 min  Manual therapy for neck PROM, stretching, C-T spine mobilizations, mid T spine manipulation  Supine chin tucks with extension X 10  Supine chin tuck with deep neck flexor strengthening X 10        PT Long Term Goals - 09/30/18 1025      PT LONG TERM GOAL #1   Title  Pt will be I and compliant with HEP. (Target goal for all goals 6 weeks 11/11/18)     Status  New      PT LONG TERM GOAL #2   Title  Pt will improve neck ROM to [facility] to improve mobility    Baseline  50% limited with extension, and side bending    Status  New      PT LONG TERM GOAL #3   Title  Pt will improve overall activity tolerance for funcitonal use of her arms and report improved sleeping quality.    Status  New      PT LONG TERM GOAL #4   Title  Pt will improve strength to at least 5-/5 MMT to improve function    Baseline  4/5 shoulder IR/ER and grip    Status  New      PT LONG TERM GOAL #5   Title  Pt will report decreased radiculopathy and pain overall in her arms and hands    Status  New            Plan - 10/14/18 1006    Clinical Impression Statement  Traction continued for spinal decompression and radiculopathy, followed by Cervical mobilizaiton exercises and manual therapy. She did have some referred pain down to her  elbow with Thoracic PA mobs so with consent from patient peformed thoracic manipulation with successful cavitation.    PT Frequency  2x / week    PT Duration  6 weeks    PT Treatment/Interventions  ADLs/Self Care Home Management;Cryotherapy;Electrical Stimulation;Iontophoresis 4mg /ml Dexamethasone;Moist Heat;Traction;Ultrasound;Therapeutic activities;Therapeutic exercise;Neuromuscular re-education;Manual techniques;Passive range of motion;Dry needling;Spinal Manipulations;Joint Manipulations;Taping    PT Next Visit Plan  assess her response to traction and HEP, consider manual therapy for cervical    PT Home Exercise Plan  Access Code: [email or web address], added bilat ER and mid trap/H abd with red       Patient will benefit from skilled therapeutic intervention in order to improve the following deficits and impairments:     Visit Diagnosis: Muscle weakness (generalized)  Other disturbances of skin sensation  Pain in right arm  Pain in left arm  Cervicalgia     Problem List Patient Active Problem List   Diagnosis Date Noted  .  Ulnar neuropathy at elbow, right 08/24/2018  . Neck pain 12/23/2017  . Vitamin D deficiency 03/04/2016  . Mixed hyperlipidemia 03/04/2016  . Muscle cramps 03/04/2016  . Paresthesias 03/04/2016  . Allergic rhinitis, cause unspecified 06/24/2013  . Hypothyroidism 06/24/2013    Birdie RiddleBrian R Aariz Maish,PT,DPT 10/14/2018, 10:15 AM  [facility] [address] [place], [place], [address] Phone: [phone]   Fax:  [phone]  Name: Stacey BeaversMercedes Gardner MRN: [id number] Date of Birth: 07/07/1965

## 2018-10-19 ENCOUNTER — Ambulatory Visit: Payer: BC Managed Care – PPO | Admitting: Physical Therapy

## 2018-10-19 ENCOUNTER — Other Ambulatory Visit: Payer: Self-pay

## 2018-10-19 DIAGNOSIS — M542 Cervicalgia: Secondary | ICD-10-CM

## 2018-10-19 DIAGNOSIS — R278 Other lack of coordination: Secondary | ICD-10-CM | POA: Diagnosis not present

## 2018-10-19 DIAGNOSIS — M79602 Pain in left arm: Secondary | ICD-10-CM | POA: Diagnosis not present

## 2018-10-19 DIAGNOSIS — M6281 Muscle weakness (generalized): Secondary | ICD-10-CM

## 2018-10-19 DIAGNOSIS — R208 Other disturbances of skin sensation: Secondary | ICD-10-CM | POA: Diagnosis not present

## 2018-10-19 DIAGNOSIS — M79601 Pain in right arm: Secondary | ICD-10-CM | POA: Diagnosis not present

## 2018-10-19 NOTE — Patient Instructions (Signed)

## 2018-10-19 NOTE — Therapy (Signed)
[facility] [address] [place], [place], [address] Phone: [phone]   Fax:  [phone]  Physical Therapy Treatment  Patient Details  Name: Stacey Gardner MRN: [id number] Date of Birth: 10/23/1965 Referring Provider (PT): Anson FretAhern, Antonia B, MD   Encounter Date: 10/19/2018  PT End of Session - 10/19/18 1038    Visit Number  4    Number of Visits  12    Date for PT Re-Evaluation  11/11/18    Authorization Type  BCBS    PT Start Time  0930    PT Stop Time  1025    PT Time Calculation (min)  55 min    Activity Tolerance  Patient tolerated treatment well    Behavior During Therapy  [facility] for tasks assessed/performed       Past Medical History:  Diagnosis Date  . Allergy   . Anemia   . Depression   . Hypothyroid   . Mixed hyperlipidemia 03/04/2016  . Vitamin D deficiency     Past Surgical History:  Procedure Laterality Date  . CERVICAL POLYPECTOMY      There were no vitals filed for this visit.  Subjective Assessment - 10/19/18 0942    Subjective  She relays the Right side of her neck is doing much better and her grip strength is even improving. However the Lt side of her neck is bothering her more 4/10 pain that started yesterday and she is not sure why.    Pertinent History  Cubital tunnel syndrome    Diagnostic tests  OA of spine w/ cervical radiculopathy (Rt C5-6 impingement on recent MRI)    Patient Stated Goals  get the pain better    Pain Onset  More than a month ago          [facility] Adult PT Treatment/Exercise - 10/19/18 0001      Modalities   Modalities  Electrical Stimulation;Moist Heat;Traction      Moist Heat Therapy   Number Minutes Moist Heat  15 Minutes    Moist Heat Location  Cervical      Electrical Stimulation   Electrical Stimulation Location  neck    Electrical Stimulation Action  IFC    Electrical Stimulation Parameters  to tolerance    Electrical Stimulation Goals  Pain       Traction   Type of Traction  Cervical    Max (lbs)  12-14    Time  15 min static hold with saunders unit      Manual Therapy   Manual therapy comments  T.P release, STM, IASTM to cervical P.S, and traps             PT Education - 10/19/18 1038    Education Details  provided recommendations for orthopedic pillows, home traction unit, home TENS unit          PT Long Term Goals - 10/19/18 1042      PT LONG TERM GOAL #1   Title  Pt will be I and compliant with HEP. (Target goal for all goals 6 weeks 11/11/18)    Status  On-going      PT LONG TERM GOAL #2   Title  Pt will improve neck ROM to [facility] to improve mobility    Baseline  50% limited with extension, and side bending    Status  On-going      PT LONG TERM GOAL #3   Title  Pt will improve overall activity tolerance for  funcitonal use of her arms and report improved sleeping quality.    Status  On-going      PT LONG TERM GOAL #4   Title  Pt will improve strength to at least 5-/5 MMT to improve function    Baseline  4/5 shoulder IR/ER and grip    Status  On-going      PT LONG TERM GOAL #5   Title  Pt will report decreased radiculopathy and pain overall in her arms and hands    Status  On-going            Plan - 10/19/18 1039    Clinical Impression Statement  Pt is doing well with Rt sided neck and UE function but is having more Lt sided neck pain, she does have trigger points and muscle tension in upper traps and paraspinals so was treated with manual therapy for this combined with traction, and trial of MHP with TENS to decrease pain. She relays she felt better after session. She was proved education on recommendations for home TENS unit, pillows, traction unit, and self trigger point release tool. Continue POC    PT Frequency  2x / week    PT Duration  6 weeks    PT Treatment/Interventions  ADLs/Self Care Home Management;Cryotherapy;Electrical Stimulation;Iontophoresis 4mg /ml Dexamethasone;Moist  Heat;Traction;Ultrasound;Therapeutic activities;Therapeutic exercise;Neuromuscular re-education;Manual techniques;Passive range of motion;Dry needling;Spinal Manipulations;Joint Manipulations;Taping    PT Next Visit Plan  what was response to last session, did she try any of the home equipment recommendations    PT Home Exercise Plan  Access Code: [email or web address], added bilat ER and mid trap/H abd with red       Patient will benefit from skilled therapeutic intervention in order to improve the following deficits and impairments:     Visit Diagnosis: Muscle weakness (generalized)  Pain in right arm  Pain in left arm  Cervicalgia  Other lack of coordination     Problem List Patient Active Problem List   Diagnosis Date Noted  . Ulnar neuropathy at elbow, right 08/24/2018  . Neck pain 12/23/2017  . Vitamin D deficiency 03/04/2016  . Mixed hyperlipidemia 03/04/2016  . Muscle cramps 03/04/2016  . Paresthesias 03/04/2016  . Allergic rhinitis, cause unspecified 06/24/2013  . Hypothyroidism 06/24/2013    Debbe Odea, PT,DPT 10/19/2018, 10:43 AM  [facility] [address] [place], [place], [address] Phone: [phone]   Fax:  [phone]  Name: Stacey Gardner MRN: [id number] Date of Birth: 02/04/1966

## 2018-10-21 ENCOUNTER — Ambulatory Visit: Payer: BC Managed Care – PPO | Admitting: Occupational Therapy

## 2018-10-21 ENCOUNTER — Other Ambulatory Visit: Payer: Self-pay

## 2018-10-21 ENCOUNTER — Ambulatory Visit: Payer: BC Managed Care – PPO | Admitting: Physical Therapy

## 2018-10-21 DIAGNOSIS — M6281 Muscle weakness (generalized): Secondary | ICD-10-CM

## 2018-10-21 DIAGNOSIS — R278 Other lack of coordination: Secondary | ICD-10-CM

## 2018-10-21 DIAGNOSIS — R208 Other disturbances of skin sensation: Secondary | ICD-10-CM | POA: Diagnosis not present

## 2018-10-21 DIAGNOSIS — M79602 Pain in left arm: Secondary | ICD-10-CM | POA: Diagnosis not present

## 2018-10-21 DIAGNOSIS — M542 Cervicalgia: Secondary | ICD-10-CM | POA: Diagnosis not present

## 2018-10-21 DIAGNOSIS — M79601 Pain in right arm: Secondary | ICD-10-CM | POA: Diagnosis not present

## 2018-10-21 NOTE — Patient Instructions (Signed)
  1. Grip Strengthening (Resistive Putty)   Squeeze putty using thumb and all fingers. Repeat _20___ times. Do __2__ sessions per day.   2. MP Flexion (Resistive Putty)    Bending only at large knuckles, press putty down against thumb. Keep fingertips straight. Repeat __10__ times. Do __2__ sessions per day.   3. Roll putty into tube on table and pinch between first 2 fingers and thumb x 10 reps. Do 2 sessiones per day    Coordination Activities  Perform the following activities for 10 minutes 1 times per day with both hand(s).   Rotate ball in fingertips (clockwise and counter-clockwise).  Rotate one card in hand (clockwise and counter-clockwise).  Pick up coins one at a time until you get 5 in your hand, then move coins from palm to fingertips to stack one at a time.

## 2018-10-21 NOTE — Therapy (Signed)
[facility] [address] [place], [place], [address] Phone: [phone]   Fax:  [phone]  Physical Therapy Treatment  Patient Details  Name: Stacey Gardner MRN: [id number] Date of Birth: 09/10/1965 Referring Provider (PT): Stacey Gardner, Stacey B, MD   Encounter Date: 10/21/2018  PT End of Session - 10/21/18 1011    Visit Number  5    Number of Visits  12    Date for PT Re-Evaluation  11/11/18    Authorization Type  BCBS    PT Start Time  0930    PT Stop Time  1025    PT Time Calculation (min)  55 min    Activity Tolerance  Patient tolerated treatment well    Behavior During Therapy  [facility] for tasks assessed/performed       Past Medical History:  Diagnosis Date  . Allergy   . Anemia   . Depression   . Hypothyroid   . Mixed hyperlipidemia 03/04/2016  . Vitamin D deficiency     Past Surgical History:  Procedure Laterality Date  . CERVICAL POLYPECTOMY      There were no vitals filed for this visit.  Subjective Assessment - 10/21/18 1009    Subjective  She relays the right side of her neck and Rt arm are doing great, still has some left sided neck pain with a little radiuclopathy today. 3/10 pain overall    Pertinent History  Cubital tunnel syndrome    Diagnostic tests  OA of spine w/ cervical radiculopathy (Rt C5-6 impingement on recent MRI)    Patient Stated Goals  get the pain better    Pain Onset  More than a month ago         [facility] PT Assessment - 10/21/18 0001      Assessment   Medical Diagnosis  neck pain with radiculopathy    Referring Provider (PT)  Stacey Gardner, Stacey B, MD      AROM   AROM Assessment Site  Cervical    Cervical Flexion  WNL    Cervical Extension  WNL    Cervical - Right Side Bend  60%    Cervical - Left Side Bend  60%    Cervical - Right Rotation  WNL    Cervical - Left Rotation  WNL                   [facility] Adult PT Treatment/Exercise - 10/21/18 0001      Neck Exercises: Seated   Other Seated Exercise  sidebending to her right X 10 with manual assist to left facets for opening and rotation, then rotation to right side with manual facilitation of Lt facets into rotation      Neck Exercises: Supine   Neck Retraction  20 reps      Modalities   Modalities  Electrical Stimulation;Moist Heat;Traction      Moist Heat Therapy   Number Minutes Moist Heat  15 Minutes    Moist Heat Location  Cervical      Electrical Stimulation   Electrical Stimulation Location  neck    Electrical Stimulation Action  IFC    Electrical Stimulation Parameters  tolerance    Electrical Stimulation Goals  Pain      Traction   Type of Traction  Cervical    Max (lbs)  13-15    Time  15 min static hold with saunders unit      Manual Therapy   Manual therapy comments  Lt Cervical mobilizations with patient in right rotation and Lt sidbending moving toward opposite shoulder C3-T1 grade 2-3, manual stretching for Lt upper traps                  PT Long Term Goals - 10/21/18 1039      PT LONG TERM GOAL #1   Title  Pt will be I and compliant with HEP. (Target goal for all goals 6 weeks 11/11/18)    Status  On-going      PT LONG TERM GOAL #2   Title  Pt will improve neck ROM to [facility] to improve mobility    Baseline  40% limited with side bending    Status  On-going      PT LONG TERM GOAL #3   Title  Pt will improve overall activity tolerance for funcitonal use of her arms and report improved sleeping quality.    Status  On-going      PT LONG TERM GOAL #4   Title  Pt will improve strength to at least 5-/5 MMT to improve function    Baseline  4/5 shoulder IR/ER and grip    Status  On-going      PT LONG TERM GOAL #5   Title  Pt will report decreased radiculopathy and pain overall in her arms and hands    Status  On-going            Plan - 10/21/18 1038    Clinical Impression Statement  Pain is better on Lt side today but still there. She now  has full neck extension ROM and rotation ROM, but still lacks some sidebening ROM due to pain and tightness. Continued manual therapy for ROM and pain, continued TENS and traction for pain and radiculopathy. Continue POC    PT Frequency  2x / week    PT Duration  6 weeks    PT Treatment/Interventions  ADLs/Self Care Home Management;Cryotherapy;Electrical Stimulation;Iontophoresis 4mg /ml Dexamethasone;Moist Heat;Traction;Ultrasound;Therapeutic activities;Therapeutic exercise;Neuromuscular re-education;Manual techniques;Passive range of motion;Dry needling;Spinal Manipulations;Joint Manipulations;Taping    PT Next Visit Plan  what was response to last session, did she try any of the home equipment recommendations    PT Home Exercise Plan  Access Code: [email or web address], added bilat ER and mid trap/H abd with red       Patient will benefit from skilled therapeutic intervention in order to improve the following deficits and impairments:     Visit Diagnosis: Muscle weakness (generalized)  Pain in right arm  Pain in left arm  Cervicalgia  Other lack of coordination     Problem List Patient Active Problem List   Diagnosis Date Noted  . Ulnar neuropathy at elbow, right 08/24/2018  . Neck pain 12/23/2017  . Vitamin D deficiency 03/04/2016  . Mixed hyperlipidemia 03/04/2016  . Muscle cramps 03/04/2016  . Paresthesias 03/04/2016  . Allergic rhinitis, cause unspecified 06/24/2013  . Hypothyroidism 06/24/2013    Silvestre Mesi 10/21/2018, 10:41 AM  [facility] [address] [place], [place], [address] Phone: [phone]   Fax:  [phone]  Name: Stacey Gardner MRN: [id number] Date of Birth: 05/08/1965

## 2018-10-21 NOTE — Therapy (Signed)
[facility] [address] [place], [place], [address] Phone: [phone]   Fax:  [phone]  Occupational Therapy Treatment  Patient Details  Name: Stacey Gardner MRN: [id number] Date of Birth: 23-Jun-1965 Referring Provider (OT): Dr. Sarina Ill   Encounter Date: 10/21/2018  OT End of Session - 10/21/18 1220    Visit Number  6    Number of Visits  12    Date for OT Re-Evaluation  10/23/18    Authorization Type  BC/BS PPO   (used 6-7 visits at [facility] therapy this year)--30 visit limit combined OT/PT Chiro (counts as 1 if on same day)    OT Start Time  1020    OT Stop Time  1100    OT Time Calculation (min)  40 min    Activity Tolerance  Patient tolerated treatment well    Behavior During Therapy  [facility] for tasks assessed/performed       Past Medical History:  Diagnosis Date  . Allergy   . Anemia   . Depression   . Hypothyroid   . Mixed hyperlipidemia 03/04/2016  . Vitamin D deficiency     Past Surgical History:  Procedure Laterality Date  . CERVICAL POLYPECTOMY      There were no vitals filed for this visit.  Subjective Assessment - 10/21/18 1023    Subjective   I just have tightness in my Rt hand but no pain in Rt arm/hand, and tingling Lt hand last 3 digits.    Pertinent History  OA of spine w/ cervical radiculopathy (C5-6 impingement on recent MRI) - Dr. Jaynee Eagles feels this is separate from cubital tunnel syndrome, but now pt is beginning to feel ulnar neuropathy on Lt side too    Limitations  none    Currently in Pain?  Yes    Pain Score  3     Pain Location  Neck   along upper traps   Pain Orientation  Left    Pain Descriptors / Indicators  Aching    Pain Type  Acute pain    Pain Onset  More than a month ago    Pain Frequency  Intermittent    Aggravating Factors   worse when first waking up    Pain Relieving Factors  Heat, TENS        Pt did not need review of theraband HEP and reports  increase in LUE and neck symptoms are not due to theraband ex's, and feel she may have slept wrong. Reviewed frequency/duration of this theraband HEP as well as ulnar nerve gliding HEP. Issued putty and coordination HEP - see pt instructions for details. Pt issued red resistance putty. Pt required cues to perform correctly re: finger/thumb positions w/ last 2 putty ex's.  Pt shown cut resistance glove due to pt's report of small cuts at fingertips. Pt provided handout and told where/how to purchase. ALso discussed different options including: hand chopper or buying things already cut.                    OT Education - 10/21/18 1050    Education Details  putty HEP, in hand manipulation coordination HEP, A/E for safety w/ cutting (also reviewed frequency and duration of previous HEP's)    Person(s) Educated  Patient    Methods  Explanation;Demonstration;Verbal cues;Handout    Comprehension  Verbalized understanding;Returned demonstration;Verbal cues required       OT Short Term Goals - 10/21/18 1220  OT SHORT TERM GOAL #1   Title  Independent with splint wear and care    Time  3    Period  Weeks    Status  Achieved      OT SHORT TERM GOAL #2   Title  Pt to verbalize understanding of proper positioning and pain management strategies to reduce symptoms of pain and numbness    Time  3    Period  Weeks    Status  Achieved      OT SHORT TERM GOAL #3   Title  Independent with coordination and putty HEP for Rt hand (possibly Lt hand to prevent further deficits)    Time  3    Period  Weeks    Status  Achieved        OT Long Term Goals - 10/21/18 1221      OT LONG TERM GOAL #1   Title  Pt to report decreased overall pain and numbness distal RUE    Time  6    Period  Weeks    Status  Achieved      OT LONG TERM GOAL #2   Title  Pt to improve RUE grip strength to 48 lbs or greater    Baseline  41 lbs (Lt = 50 lbs)    Time  6    Period  Weeks    Status  On-going       OT LONG TERM GOAL #3   Title  Pt to report increased ease with opening jars and chopping and use of RUE    Time  6    Period  Weeks    Status  On-going            Plan - 10/21/18 1221    Clinical Impression Statement  Pt has now met all STG's and 1 LTG. Pt progressing towards remaining goals    Occupational performance deficits (Please refer to evaluation for details):  ADL's;IADL's    Body Structure / Function / Physical Skills  IADL;Strength;Coordination;FMC;UE functional use;Pain;Sensation    Rehab Potential  Good    OT Frequency  2x / week    OT Duration  6 weeks    OT Treatment/Interventions  Self-care/ADL training;Therapeutic exercise;Aquatic Therapy;Ultrasound;Neuromuscular education;Splinting;Manual Therapy;Therapeutic activities;Coping strategies training;DME and/or AE instruction;Fluidtherapy;Moist Heat;Passive range of motion;Patient/family education    Plan  review putty HEP, continue UE strengthening, assess if bean bag splint helped on LUE after wearing at night    Consulted and Agree with Plan of Care  Patient       Patient will benefit from skilled therapeutic intervention in order to improve the following deficits and impairments:   Body Structure / Function / Physical Skills: IADL, Strength, Coordination, FMC, UE functional use, Pain, Sensation       Visit Diagnosis: Muscle weakness (generalized)  Other lack of coordination  Other disturbances of skin sensation    Problem List Patient Active Problem List   Diagnosis Date Noted  . Ulnar neuropathy at elbow, right 08/24/2018  . Neck pain 12/23/2017  . Vitamin D deficiency 03/04/2016  . Mixed hyperlipidemia 03/04/2016  . Muscle cramps 03/04/2016  . Paresthesias 03/04/2016  . Allergic rhinitis, cause unspecified 06/24/2013  . Hypothyroidism 06/24/2013    Carey Bullocks, OTR/L 10/21/2018, 12:23 PM  [facility] [address] [place], [place], [address] Phone: [phone]   Fax:  [phone]  Name: Stacey Gardner MRN: [id number] Date of Birth: 05-22-1965

## 2018-10-26 ENCOUNTER — Ambulatory Visit: Payer: BC Managed Care – PPO | Admitting: Physical Therapy

## 2018-10-26 ENCOUNTER — Other Ambulatory Visit: Payer: Self-pay

## 2018-10-26 DIAGNOSIS — M542 Cervicalgia: Secondary | ICD-10-CM

## 2018-10-26 DIAGNOSIS — M6281 Muscle weakness (generalized): Secondary | ICD-10-CM

## 2018-10-26 DIAGNOSIS — M79601 Pain in right arm: Secondary | ICD-10-CM

## 2018-10-26 DIAGNOSIS — M79602 Pain in left arm: Secondary | ICD-10-CM

## 2018-10-26 DIAGNOSIS — R278 Other lack of coordination: Secondary | ICD-10-CM | POA: Diagnosis not present

## 2018-10-26 DIAGNOSIS — R208 Other disturbances of skin sensation: Secondary | ICD-10-CM | POA: Diagnosis not present

## 2018-10-26 NOTE — Therapy (Signed)
[facility] [address] [place], [place], [address] Phone: [phone]   Fax:  [phone]  Physical Therapy Treatment  Patient Details  Name: Stacey Gardner MRN: [id number] Date of Birth: 1965/11/26 Referring Provider (PT): Melvenia Beam, MD   Encounter Date: 10/26/2018  PT End of Session - 10/26/18 1004    Visit Number  6    Number of Visits  12    Date for PT Re-Evaluation  11/11/18    Authorization Type  BCBS    PT Start Time  0932    PT Stop Time  1015    PT Time Calculation (min)  43 min    Activity Tolerance  Patient tolerated treatment well    Behavior During Therapy  [facility] for tasks assessed/performed       Past Medical History:  Diagnosis Date  . Allergy   . Anemia   . Depression   . Hypothyroid   . Mixed hyperlipidemia 03/04/2016  . Vitamin D deficiency     Past Surgical History:  Procedure Laterality Date  . CERVICAL POLYPECTOMY      There were no vitals filed for this visit.  Subjective Assessment - 10/26/18 1003    Subjective  Relays neck pain is better overall and has been doing her exercises, however she still has pain when she wakes up, wants PT to look at her pillow she brought in today. Had some N/T in Right hand yesteday but feels better today, no pain currently upon arrival    Pertinent History  Cubital tunnel syndrome    Currently in Pain?  No/denies         [facility] PT Assessment - 10/26/18 0001      Assessment   Medical Diagnosis  neck pain with radiculopathy    Referring Provider (PT)  Melvenia Beam, MD      AROM   Cervical Flexion  WNL    Cervical Extension  WNL    Cervical - Right Side Bend  75%    Cervical - Left Side Bend  75%    Cervical - Right Rotation  WNL    Cervical - Left Rotation  WNL      Strength   Overall Strength Comments  UE strength overall 4+/5                   [facility] Adult PT Treatment/Exercise - 10/26/18 0001      Traction    Type of Traction  Cervical    Max (lbs)  13-15    Time  15 min static hold with saunders unit      Manual Therapy   Manual therapy comments  central PA mobs for C-T spine mobs grade 2-3 for cervical and grade 3-5 with thoracic with one multilevel cavitation noted. neck PROM and STM/T.P release to upper traps      Neck Exercises: Stretches   Upper Trapezius Stretch  Right;Left;2 reps;30 seconds     Chin tucks X 20        PT Education - 10/26/18 1409    Education Details  educated on sleeping posture and orhtopedic pillow recomendations.    Person(s) Educated  Patient    Methods  Explanation;Handout    Comprehension  Verbalized understanding          PT Long Term Goals - 10/26/18 1005      PT LONG TERM GOAL #1   Title  Pt will be I and compliant with HEP. (  Target goal for all goals 6 weeks 11/11/18)    Status  Achieved      PT LONG TERM GOAL #2   Title  Pt will improve neck ROM to [facility] to improve mobility    Baseline  40% limited with side bending    Status  On-going      PT LONG TERM GOAL #3   Title  Pt will improve overall activity tolerance for funcitonal use of her arms and report improved sleeping quality.    Baseline  some improvement in activity tolerance but poor sleeping quality currently due to pain and discomfort    Status  On-going      PT LONG TERM GOAL #4   Title  Pt will improve strength to at least 5-/5 MMT to improve function    Baseline  4+    Status  On-going      PT LONG TERM GOAL #5   Title  Pt will report decreased radiculopathy and pain overall in her arms and hands    Status  On-going            Plan - 10/26/18 1410    Clinical Impression Statement  No pain upon arrival but still having some stiffness and tightness in her neck as well as intermittent numbness in her hands however overall it is improving. Continued traction today and manual therapy with good return.    PT Frequency  2x / week    PT Duration  6 weeks    PT  Treatment/Interventions  ADLs/Self Care Home Management;Cryotherapy;Electrical Stimulation;Iontophoresis 4mg /ml Dexamethasone;Moist Heat;Traction;Ultrasound;Therapeutic activities;Therapeutic exercise;Neuromuscular re-education;Manual techniques;Passive range of motion;Dry needling;Spinal Manipulations;Joint Manipulations;Taping    PT Next Visit Plan  what was response to last session, did she try any of the home equipment recommendations    PT Home Exercise Plan  Access Code: [email or web address], added bilat ER and mid trap/H abd with red       Patient will benefit from skilled therapeutic intervention in order to improve the following deficits and impairments:     Visit Diagnosis: Muscle weakness (generalized)  Pain in right arm  Pain in left arm  Cervicalgia     Problem List Patient Active Problem List   Diagnosis Date Noted  . Ulnar neuropathy at elbow, right 08/24/2018  . Neck pain 12/23/2017  . Vitamin D deficiency 03/04/2016  . Mixed hyperlipidemia 03/04/2016  . Muscle cramps 03/04/2016  . Paresthesias 03/04/2016  . Allergic rhinitis, cause unspecified 06/24/2013  . Hypothyroidism 06/24/2013    06/26/2013 ,PT,DPT 10/26/2018, 2:12 PM   [facility] [address] [place], [place], [place] Phone: [phone]   Fax:  [phone]  Name: Jala Dundon MRN: Jerrye Beavers Date of Birth: 1965-07-20

## 2018-10-28 ENCOUNTER — Ambulatory Visit: Payer: BC Managed Care – PPO | Admitting: Occupational Therapy

## 2018-10-28 ENCOUNTER — Other Ambulatory Visit: Payer: Self-pay

## 2018-10-28 ENCOUNTER — Ambulatory Visit: Payer: BC Managed Care – PPO | Admitting: Physical Therapy

## 2018-10-28 DIAGNOSIS — M79601 Pain in right arm: Secondary | ICD-10-CM

## 2018-10-28 DIAGNOSIS — R208 Other disturbances of skin sensation: Secondary | ICD-10-CM

## 2018-10-28 DIAGNOSIS — M6281 Muscle weakness (generalized): Secondary | ICD-10-CM

## 2018-10-28 DIAGNOSIS — M79602 Pain in left arm: Secondary | ICD-10-CM

## 2018-10-28 DIAGNOSIS — M542 Cervicalgia: Secondary | ICD-10-CM

## 2018-10-28 DIAGNOSIS — R278 Other lack of coordination: Secondary | ICD-10-CM

## 2018-10-28 NOTE — Therapy (Signed)
[facility] [address] [place], [place], [address] Phone: [phone]   Fax:  [phone]  Occupational Therapy Treatment  Patient Details  Name: Stacey Gardner MRN: [id number] Date of Birth: 1965/06/10 Referring Provider (OT): Dr. Sarina Ill   Encounter Date: 10/28/2018  OT End of Session - 10/28/18 1500    Visit Number  7    Number of Visits  12    Date for OT Re-Evaluation  10/23/18    Authorization Type  BC/BS PPO   (used 6-7 visits at [facility] therapy this year)--30 visit limit combined OT/PT Chiro (counts as 1 if on same day)    OT Start Time  0850    OT Stop Time  0930    OT Time Calculation (min)  40 min    Activity Tolerance  Patient tolerated treatment well    Behavior During Therapy  [facility] for tasks assessed/performed       Past Medical History:  Diagnosis Date  . Allergy   . Anemia   . Depression   . Hypothyroid   . Mixed hyperlipidemia 03/04/2016  . Vitamin D deficiency     Past Surgical History:  Procedure Laterality Date  . CERVICAL POLYPECTOMY      There were no vitals filed for this visit.  Subjective Assessment - 10/28/18 0855    Subjective   no pain in hands just numbness (Lt small finger and part of ring finger, Rt small, ring, and part of long finger)    Pertinent History  OA of spine w/ cervical radiculopathy (C5-6 impingement on recent MRI) - Dr. Jaynee Eagles feels this is separate from cubital tunnel syndrome, but now pt is beginning to feel ulnar neuropathy on Lt side too    Limitations  none    Currently in Pain?  Yes    Pain Score  1     Pain Location  Neck   P.T. addressing       Assessed remaining goals and progress to date. Pt had questions re: elbow splints and instructed pt in wearing elbow brace that she already had for other elbow. Reviewed proper positioning and what positions to avoid.  Reviewed coordination activities from HEP and to avoid shoulder hiking.                       OT Short Term Goals - 10/21/18 1220      OT SHORT TERM GOAL #1   Title  Independent with splint wear and care    Time  3    Period  Weeks    Status  Achieved      OT SHORT TERM GOAL #2   Title  Pt to verbalize understanding of proper positioning and pain management strategies to reduce symptoms of pain and numbness    Time  3    Period  Weeks    Status  Achieved      OT SHORT TERM GOAL #3   Title  Independent with coordination and putty HEP for Rt hand (possibly Lt hand to prevent further deficits)    Time  3    Period  Weeks    Status  Achieved        OT Long Term Goals - 10/28/18 1500      OT LONG TERM GOAL #1   Title  Pt to report decreased overall pain and numbness distal RUE    Time  6    Period  Weeks  Status  Achieved      OT LONG TERM GOAL #2   Title  Pt to improve RUE grip strength to 48 lbs or greater    Baseline  41 lbs (Lt = 50 lbs)    Time  6    Period  Weeks    Status  Achieved   55 LBS     OT LONG TERM GOAL #3   Title  Pt to report increased ease with opening jars and chopping and use of RUE    Time  6    Period  Weeks    Status  Achieved            Plan - 10/28/18 1501    Clinical Impression Statement  Pt has met all goals at this time. Pt still reports some numbness bilateral hands along ulnar n. distribution but reports this appears to be getting better.    Occupational performance deficits (Please refer to evaluation for details):  ADL's;IADL's    Body Structure / Function / Physical Skills  IADL;Strength;Coordination;FMC;UE functional use;Pain;Sensation    Rehab Potential  Good    Comorbidities impacting occupational performance description:  CERVICAL RADICULOPATHY    OT Frequency  2x / week    OT Duration  6 weeks    OT Treatment/Interventions  Self-care/ADL training;Therapeutic exercise;Aquatic Therapy;Ultrasound;Neuromuscular education;Splinting;Manual Therapy;Therapeutic activities;Coping  strategies training;DME and/or AE instruction;Fluidtherapy;Moist Heat;Passive range of motion;Patient/family education    Plan  D/C O.Darene Lamer    Consulted and Agree with Plan of Care  Patient       Patient will benefit from skilled therapeutic intervention in order to improve the following deficits and impairments:   Body Structure / Function / Physical Skills: IADL, Strength, Coordination, FMC, UE functional use, Pain, Sensation       Visit Diagnosis: Muscle weakness (generalized)  Other lack of coordination  Other disturbances of skin sensation    Problem List Patient Active Problem List   Diagnosis Date Noted  . Ulnar neuropathy at elbow, right 08/24/2018  . Neck pain 12/23/2017  . Vitamin D deficiency 03/04/2016  . Mixed hyperlipidemia 03/04/2016  . Muscle cramps 03/04/2016  . Paresthesias 03/04/2016  . Allergic rhinitis, cause unspecified 06/24/2013  . Hypothyroidism 06/24/2013   OCCUPATIONAL THERAPY DISCHARGE SUMMARY  Visits from Start of Care: 7  Current functional level related to goals / functional outcomes: See above   Remaining deficits: Numbness ulnar side of hand/fingers    Education / Equipment: HEP's, splint wear and care, A/E recommendations, positioning  Plan: Patient agrees to discharge.  Patient goals were met. Patient is being discharged due to meeting the stated rehab goals.  ?????        Carey Bullocks, OTR/L 10/28/2018, 3:02 PM  [facility] [address] [place], [place], [address] Phone: [phone]   Fax:  [phone]  Name: Stacey Gardner MRN: [id number] Date of Birth: 13-Mar-1965

## 2018-10-28 NOTE — Therapy (Signed)
[facility] [address] [place], [place], [address] Phone: [phone]   Fax:  [phone]  Physical Therapy Treatment  Patient Details  Name: Avionna Bower MRN: [id number] Date of Birth: Mar 29, 1965 Referring Provider (PT): Anson Fret, MD   Encounter Date: 10/28/2018  PT End of Session - 10/28/18 1307    Visit Number  7    Number of Visits  12    Date for PT Re-Evaluation  11/11/18    Authorization Type  BCBS    PT Start Time  0930    PT Stop Time  1015    PT Time Calculation (min)  45 min    Activity Tolerance  Patient tolerated treatment well    Behavior During Therapy  [facility] for tasks assessed/performed       Past Medical History:  Diagnosis Date  . Allergy   . Anemia   . Depression   . Hypothyroid   . Mixed hyperlipidemia 03/04/2016  . Vitamin D deficiency     Past Surgical History:  Procedure Laterality Date  . CERVICAL POLYPECTOMY      There were no vitals filed for this visit.  Subjective Assessment - 10/28/18 1305    Subjective  pain is overall much better today only 1-2/10 in her neck, she has been only sleeping with one pillow which is helping       Treatment/Interventions   added SCM stretch 30 sec X 3 bilat Mechanical cervical Traction 13-16 lbs X with saunders unit chin tucks supine X 20 UT stretch 30 sec X 2 ea manual therapy for STM, T.P. release, passive neck stretching, C-T spine mobs, S.O relase X 20 min total for manual therapy   PT Education - 10/28/18 1307    Education Details  added SCM stretch to HEP    Person(s) Educated  Patient    Methods  Explanation;Demonstration;Verbal cues;Handout    Comprehension  Verbalized understanding;Returned demonstration          PT Long Term Goals - 10/26/18 1005      PT LONG TERM GOAL #1   Title  Pt will be I and compliant with HEP. (Target goal for all goals 6 weeks 11/11/18)    Status  Achieved      PT LONG  TERM GOAL #2   Title  Pt will improve neck ROM to [facility] to improve mobility    Baseline  40% limited with side bending    Status  On-going      PT LONG TERM GOAL #3   Title  Pt will improve overall activity tolerance for funcitonal use of her arms and report improved sleeping quality.    Baseline  some improvement in activity tolerance but poor sleeping quality currently due to pain and discomfort    Status  On-going      PT LONG TERM GOAL #4   Title  Pt will improve strength to at least 5-/5 MMT to improve function    Baseline  4+    Status  On-going      PT LONG TERM GOAL #5   Title  Pt will report decreased radiculopathy and pain overall in her arms and hands    Status  On-going            Plan - 10/28/18 1307    Clinical Impression Statement  continued traction and manual therapy as she is responding well to this. She does have tightness in her SCM bilat so added  stretch for this today    PT Frequency  2x / week    PT Duration  6 weeks    PT Treatment/Interventions  ADLs/Self Care Home Management;Cryotherapy;Electrical Stimulation;Iontophoresis 4mg /ml Dexamethasone;Moist Heat;Traction;Ultrasound;Therapeutic activities;Therapeutic exercise;Neuromuscular re-education;Manual techniques;Passive range of motion;Dry needling;Spinal Manipulations;Joint Manipulations;Taping    PT Next Visit Plan  what was response to last session, did she try any of the home equipment recommendations    PT Home Exercise Plan  Access Code: [email or web address], added bilat ER and mid trap/H abd with red       Patient will benefit from skilled therapeutic intervention in order to improve the following deficits and impairments:     Visit Diagnosis: Muscle weakness (generalized)  Pain in right arm  Pain in left arm  Cervicalgia     Problem List Patient Active Problem List   Diagnosis Date Noted  . Ulnar neuropathy at elbow, right 08/24/2018  . Neck pain 12/23/2017  . Vitamin D deficiency 03/04/2016   . Mixed hyperlipidemia 03/04/2016  . Muscle cramps 03/04/2016  . Paresthesias 03/04/2016  . Allergic rhinitis, cause unspecified 06/24/2013  . Hypothyroidism 06/24/2013    Silvestre Mesi 10/28/2018, 1:09 PM  [facility] [address] [place], [place], [address] Phone: [phone]   Fax:  [phone]  Name: Britzy Graul MRN: [id number] Date of Birth: 1965/07/29

## 2018-11-02 ENCOUNTER — Other Ambulatory Visit: Payer: Self-pay

## 2018-11-02 ENCOUNTER — Ambulatory Visit: Payer: BC Managed Care – PPO | Admitting: Physical Therapy

## 2018-11-02 DIAGNOSIS — M79602 Pain in left arm: Secondary | ICD-10-CM | POA: Diagnosis not present

## 2018-11-02 DIAGNOSIS — M6281 Muscle weakness (generalized): Secondary | ICD-10-CM | POA: Diagnosis not present

## 2018-11-02 DIAGNOSIS — M542 Cervicalgia: Secondary | ICD-10-CM | POA: Diagnosis not present

## 2018-11-02 DIAGNOSIS — R278 Other lack of coordination: Secondary | ICD-10-CM | POA: Diagnosis not present

## 2018-11-02 DIAGNOSIS — M79601 Pain in right arm: Secondary | ICD-10-CM

## 2018-11-02 DIAGNOSIS — R208 Other disturbances of skin sensation: Secondary | ICD-10-CM | POA: Diagnosis not present

## 2018-11-02 NOTE — Therapy (Signed)
[facility] [address] [place], [place], [address] Phone: [phone]   Fax:  [phone]  Physical Therapy Treatment  Patient Details  Name: Stacey Gardner MRN: [id number] Date of Birth: 1965-06-28 Referring Provider (PT): Melvenia Beam, MD   Encounter Date: 11/02/2018  PT End of Session - 11/02/18 0959    Visit Number  8    Number of Visits  12    Date for PT Re-Evaluation  11/11/18    Authorization Type  BCBS    PT Start Time  0935    PT Stop Time  1015    PT Time Calculation (min)  40 min    Activity Tolerance  Patient tolerated treatment well    Behavior During Therapy  [facility] for tasks assessed/performed       Past Medical History:  Diagnosis Date  . Allergy   . Anemia   . Depression   . Hypothyroid   . Mixed hyperlipidemia 03/04/2016  . Vitamin D deficiency     Past Surgical History:  Procedure Laterality Date  . CERVICAL POLYPECTOMY      There were no vitals filed for this visit.  Subjective Assessment - 11/02/18 0958    Subjective  She got new pillow that PT recommended and reports she likes it,neck pain only about 1/10 today but having some trigger finger like symptoms after gardening and squeezing shears a lot of the day yesterday .    Pertinent History  Cubital tunnel syndrome                       [facility] Adult PT Treatment/Exercise - 11/02/18 0001      Exercises   Exercises  Other Exercises    Other Exercises   SCM stretch 30 sec X 3 bilat, chin tucks X 10 supine, Lt hand ring finger flexor tendon stretch 30 sec X 3, theracane X 5 min working cervical paraspinals, upper trap, SCM      Traction   Type of Traction  Cervical    Max (lbs)  13-16    Time  15 min static hold with saunders unit      Manual Therapy   Manual therapy comments  grade 5 manuipulation in standing thoracic with one multilevel cavitation noted. neck PROM and STM/T.P release to upper traps              PT Education - 11/02/18 0959    Education Details  education for self theracane use for home, education for ice massage due to trigger finger    Person(s) Educated  Patient    Methods  Explanation;Demonstration;Verbal cues    Comprehension  Verbalized understanding;Returned demonstration          PT Long Term Goals - 10/26/18 1005      PT LONG TERM GOAL #1   Title  Pt will be I and compliant with HEP. (Target goal for all goals 6 weeks 11/11/18)    Status  Achieved      PT LONG TERM GOAL #2   Title  Pt will improve neck ROM to [facility] to improve mobility    Baseline  40% limited with side bending    Status  On-going      PT LONG TERM GOAL #3   Title  Pt will improve overall activity tolerance for funcitonal use of her arms and report improved sleeping quality.    Baseline  some improvement in activity tolerance but poor sleeping  quality currently due to pain and discomfort    Status  On-going      PT LONG TERM GOAL #4   Title  Pt will improve strength to at least 5-/5 MMT to improve function    Baseline  4+    Status  On-going      PT LONG TERM GOAL #5   Title  Pt will report decreased radiculopathy and pain overall in her arms and hands    Status  On-going            Plan - 11/02/18 1000    Clinical Impression Statement  Again continued with traciton with slight increase. She was educated on self care at home for theracane use and ice massage for trigger finger. Continue POC as she is responding well to PT.    PT Frequency  2x / week    PT Duration  6 weeks    PT Treatment/Interventions  ADLs/Self Care Home Management;Cryotherapy;Electrical Stimulation;Iontophoresis 4mg /ml Dexamethasone;Moist Heat;Traction;Ultrasound;Therapeutic activities;Therapeutic exercise;Neuromuscular re-education;Manual techniques;Passive range of motion;Dry needling;Spinal Manipulations;Joint Manipulations;Taping    PT Next Visit Plan  what was response to last session, did she  try any of the home equipment recommendations    PT Home Exercise Plan  Access Code: [email or web address], added bilat ER and mid trap/H abd with red       Patient will benefit from skilled therapeutic intervention in order to improve the following deficits and impairments:     Visit Diagnosis: Muscle weakness (generalized)  Pain in right arm  Pain in left arm  Cervicalgia     Problem List Patient Active Problem List   Diagnosis Date Noted  . Ulnar neuropathy at elbow, right 08/24/2018  . Neck pain 12/23/2017  . Vitamin D deficiency 03/04/2016  . Mixed hyperlipidemia 03/04/2016  . Muscle cramps 03/04/2016  . Paresthesias 03/04/2016  . Allergic rhinitis, cause unspecified 06/24/2013  . Hypothyroidism 06/24/2013    06/26/2013 11/02/2018, 10:37 AM  [facility] [address] [place], [place], [place] Phone: [phone]   Fax:  [phone]  Name: Stacey Gardner MRN: Jerrye Beavers Date of Birth: 21-Jun-1965

## 2018-11-04 ENCOUNTER — Ambulatory Visit: Payer: BC Managed Care – PPO | Admitting: Occupational Therapy

## 2018-11-06 ENCOUNTER — Other Ambulatory Visit: Payer: Self-pay

## 2018-11-06 ENCOUNTER — Other Ambulatory Visit (INDEPENDENT_AMBULATORY_CARE_PROVIDER_SITE_OTHER): Payer: BC Managed Care – PPO

## 2018-11-06 DIAGNOSIS — Z23 Encounter for immunization: Secondary | ICD-10-CM | POA: Diagnosis not present

## 2018-11-11 ENCOUNTER — Ambulatory Visit: Payer: BC Managed Care – PPO | Attending: Neurology | Admitting: Physical Therapy

## 2018-11-11 ENCOUNTER — Other Ambulatory Visit: Payer: Self-pay

## 2018-11-11 DIAGNOSIS — R278 Other lack of coordination: Secondary | ICD-10-CM | POA: Diagnosis not present

## 2018-11-11 DIAGNOSIS — M6281 Muscle weakness (generalized): Secondary | ICD-10-CM | POA: Diagnosis not present

## 2018-11-11 DIAGNOSIS — M542 Cervicalgia: Secondary | ICD-10-CM | POA: Diagnosis not present

## 2018-11-11 DIAGNOSIS — M79602 Pain in left arm: Secondary | ICD-10-CM

## 2018-11-11 DIAGNOSIS — R209 Unspecified disturbances of skin sensation: Secondary | ICD-10-CM | POA: Insufficient documentation

## 2018-11-11 DIAGNOSIS — M79601 Pain in right arm: Secondary | ICD-10-CM | POA: Diagnosis not present

## 2018-11-11 NOTE — Therapy (Signed)
[facility] [address] [place], [place], [address] Phone: [phone]   Fax:  [phone]  Physical Therapy Treatment  Patient Details  Name: Stacey Gardner MRN: [id number] Date of Birth: April 12, 1965 Referring Provider (PT): Anson Fret, MD   Encounter Date: 11/11/2018  PT End of Session - 11/11/18 1417    Visit Number  9    Number of Visits  12    Date for PT Re-Evaluation  11/11/18    Authorization Type  BCBS    PT Start Time  0915    PT Stop Time  1000    PT Time Calculation (min)  45 min    Activity Tolerance  Patient tolerated treatment well    Behavior During Therapy  [facility] for tasks assessed/performed       Past Medical History:  Diagnosis Date  . Allergy   . Anemia   . Depression   . Hypothyroid   . Mixed hyperlipidemia 03/04/2016  . Vitamin D deficiency     Past Surgical History:  Procedure Laterality Date  . CERVICAL POLYPECTOMY      There were no vitals filed for this visit.  Subjective Assessment - 11/11/18 1415    Subjective  She is not having a lot of pain in her neck onl 2/10 but is having some numbness in her last 2 fingers    Pertinent History  Cubital tunnel syndrome    Diagnostic tests  OA of spine w/ cervical radiculopathy (Rt C5-6 impingement on recent MRI)    Patient Stated Goals  get the pain better    Pain Onset  More than a month ago                       [facility] Adult PT Treatment/Exercise - 11/11/18 0001      Exercises   Other Exercises   SCM stretch, levator stretch, upper trap stretch 30 sec X 2 ea for her Rt side, ulnar nerve flossing X 15 reps.       Traction   Type of Traction  Cervical    Max (lbs)  12-17    Time  20 min mechanical pre set program      Manual Therapy   Manual therapy comments  grade 5 manipulation in standing for Cerv-thoracic with one multilevel cavitation noted. neck PROM and STM/T.P release to upper traps         PT Long Term  Goals - 10/26/18 1005      PT LONG TERM GOAL #1   Title  Pt will be I and compliant with HEP. (Target goal for all goals 6 weeks 11/11/18)    Status  Achieved      PT LONG TERM GOAL #2   Title  Pt will improve neck ROM to [facility] to improve mobility    Baseline  40% limited with side bending    Status  On-going      PT LONG TERM GOAL #3   Title  Pt will improve overall activity tolerance for funcitonal use of her arms and report improved sleeping quality.    Baseline  some improvement in activity tolerance but poor sleeping quality currently due to pain and discomfort    Status  On-going      PT LONG TERM GOAL #4   Title  Pt will improve strength to at least 5-/5 MMT to improve function    Baseline  4+    Status  On-going  PT LONG TERM GOAL #5   Title  Pt will report decreased radiculopathy and pain overall in her arms and hands    Status  On-going            Plan - 11/11/18 1418    Clinical Impression Statement  Pt transferred from our other office, she was trialed on mechanical traction unit for intermittent holds 17-15 lbs today as this is available at this clinic. Session then focused on stretching and manual therapy for her right paraspinals, levator, SCM, and traps as these remain tight but are improving.    Comorbidities  cubital tunnel syndrome    PT Frequency  2x / week    PT Duration  6 weeks    PT Treatment/Interventions  ADLs/Self Care Home Management;Cryotherapy;Electrical Stimulation;Iontophoresis 4mg /ml Dexamethasone;Moist Heat;Traction;Ultrasound;Therapeutic activities;Therapeutic exercise;Neuromuscular re-education;Manual techniques;Passive range of motion;Dry needling;Spinal Manipulations;Joint Manipulations;Taping    PT Next Visit Plan  what was response to last session?, does she want to stay at [facility] or move back to neuro clinic    PT Home Exercise Plan  Access Code: [email or web address], added bilat ER and mid trap/H abd with red       Patient will benefit  from skilled therapeutic intervention in order to improve the following deficits and impairments:     Visit Diagnosis: Muscle weakness (generalized)  Pain in right arm  Pain in left arm  Cervicalgia     Problem List Patient Active Problem List   Diagnosis Date Noted  . Ulnar neuropathy at elbow, right 08/24/2018  . Neck pain 12/23/2017  . Vitamin D deficiency 03/04/2016  . Mixed hyperlipidemia 03/04/2016  . Muscle cramps 03/04/2016  . Paresthesias 03/04/2016  . Allergic rhinitis, cause unspecified 06/24/2013  . Hypothyroidism 06/24/2013    Silvestre Mesi 11/11/2018, 2:26 PM  [facility] [address] [place], [place], [address] Phone: [phone]   Fax:  [phone]  Name: Laressa Bolinger MRN: [id number] Date of Birth: [birth year]

## 2018-11-16 ENCOUNTER — Ambulatory Visit: Payer: BC Managed Care – PPO | Admitting: Family Medicine

## 2018-11-16 ENCOUNTER — Encounter: Payer: Self-pay | Admitting: Family Medicine

## 2018-11-16 ENCOUNTER — Other Ambulatory Visit: Payer: Self-pay

## 2018-11-16 VITALS — BP 122/72 | HR 73 | Temp 98.7°F | Ht 67.0 in | Wt 153.2 lb

## 2018-11-16 DIAGNOSIS — F32A Depression, unspecified: Secondary | ICD-10-CM | POA: Insufficient documentation

## 2018-11-16 DIAGNOSIS — E559 Vitamin D deficiency, unspecified: Secondary | ICD-10-CM

## 2018-11-16 DIAGNOSIS — E039 Hypothyroidism, unspecified: Secondary | ICD-10-CM | POA: Diagnosis not present

## 2018-11-16 DIAGNOSIS — F329 Major depressive disorder, single episode, unspecified: Secondary | ICD-10-CM

## 2018-11-16 DIAGNOSIS — Z0000 Encounter for general adult medical examination without abnormal findings: Secondary | ICD-10-CM | POA: Diagnosis not present

## 2018-11-16 DIAGNOSIS — E049 Nontoxic goiter, unspecified: Secondary | ICD-10-CM

## 2018-11-16 DIAGNOSIS — E782 Mixed hyperlipidemia: Secondary | ICD-10-CM

## 2018-11-16 DIAGNOSIS — H259 Unspecified age-related cataract: Secondary | ICD-10-CM | POA: Insufficient documentation

## 2018-11-16 HISTORY — DX: Depression, unspecified: F32.A

## 2018-11-16 NOTE — Progress Notes (Signed)
Subjective:    Patient ID: Stacey Gardner, female    DOB: 11-18-1965, 53 y.o.   MRN: [id number]  HPI Chief Complaint  Patient presents with  . fasting cpe    fasting cpe,    She is here for a complete physical exam and follow up on chronic health conditions.   Other providers: Neurologist- Dr. Lucia Gaskins  Orthopedist- Dr. Roda Shutters Dr. Amanda Pea  OB/GYN- [facility]  GI- Dr. Loreta Ave Eye doctor- Dr. Hyacinth Meeker   States her thyroid feels larger to her. She occasionally has difficulty swallowing. Reports taking levothyroxine daily on an empty stomach.   Depression- taking Effexor and doing well. No depression  HL- statin every day without side effects   Vitamin D def- taking supplement   Social history: Lives with husband who is wheelchair dependent due to CVA, does not work. She takes [employer] to get to her appointments. She does not drive.  Denies smoking, drinking alcohol, drug use  Diet: almost vegetarian  Excerise: not often   Immunizations: Tdap in 2017 at [facility] and Shingrix in 2019. Flu shot done.   Health maintenance:  Mammogram: 08/2017 Colonoscopy: 12/2017 Last Gynecological Exam: 2019 Last Dental Exam: 2020  Last Eye Exam: 2019  Wears seatbelt always, smoke detectors in home and functioning, and feels safe in home environment.   Reviewed allergies, medications, past medical, surgical, family, and social history.    Review of Systems Review of Systems Constitutional: -fever, -chills, -sweats, -unexpected weight change,-fatigue ENT: -runny nose, -ear pain, -sore throat Cardiology:  -chest pain, -palpitations, -edema Respiratory: -cough, -shortness of breath, -wheezing Gastroenterology: -abdominal pain, -nausea, -vomiting, -diarrhea, -constipation  Hematology: -bleeding or bruising problems Musculoskeletal: -arthralgias, -myalgias, -joint swelling, -back pain Ophthalmology: -vision changes Urology: -dysuria, -difficulty urinating, -hematuria, -urinary  frequency, -urgency Neurology: -headache, -weakness, -tingling, -numbness       Objective:   Physical Exam BP 122/72   Pulse 73   Temp 98.7 F (37.1 C)   Ht 5\' 7"  (1.702 m)   Wt 153 lb 3.2 oz (69.5 kg)   BMI 23.99 kg/m   General Appearance:    Alert, cooperative, no distress, appears stated age  Head:    Normocephalic, without obvious abnormality, atraumatic  Eyes:    PERRL, conjunctiva/corneas clear, EOM's intact, fundi    benign  Ears:    Normal TM's and external ear canals  Nose:   Mask in place   Throat:   Mask in place   Neck:   Supple, no lymphadenopathy;  Thyroid is slightly enlarged without tenderness/nodules; no carotid   bruit or JVD  Back:    Spine nontender, no curvature, ROM normal, no CVA     tenderness  Lungs:     Clear to auscultation bilaterally without wheezes, rales or     ronchi; respirations unlabored  Chest Wall:    No tenderness or deformity   Heart:    Regular rate and rhythm, S1 and S2 normal, no murmur, rub   or gallop  Breast Exam:    OB/GYN  Abdomen:     Soft, non-tender, nondistended, normoactive bowel sounds,    no masses, no hepatosplenomegaly  Genitalia:    OB/GYN     Extremities:   No clubbing, cyanosis or edema  Pulses:   2+ and symmetric all extremities  Skin:   Skin color, texture, turgor normal, no rashes or lesions  Lymph nodes:   Cervical, supraclavicular, and axillary nodes normal  Neurologic:   CNII-XII intact, normal strength, sensation and gait;  reflexes 2+ and symmetric throughout          Psych:   Normal mood, affect, hygiene and grooming.          Assessment & Plan:  Routine general medical examination at a health care facility - Plan: CBC with Differential/Platelet, Comprehensive metabolic panel, TSH, Lipid panel -discussed preventive health care. Has appt to see OB/GYN for pap smear and mammogram. Colonoscopy is UTD. Immunizations reviewed.   Hypothyroidism, unspecified type - Plan: TSH -check TSH and follow up. [place]  ordered due to slightly enlarged thyroid gland with subjective feeling of enlargement affecting swallowing.   Mixed hyperlipidemia - Plan: Lipid panel -consider need for statin pending lipid panel  Vitamin D deficiency - Plan: VITAMIN D 25 Hydroxy (Vit-D Deficiency, Fractures) -check vitamin D level and follow up  Depression, unspecified depression type -continue on Effexor. She is doing well.   Senile cataract of right eye, unspecified age-related cataract type  Enlarged thyroid - Plan: US THYROID -no appreciable palpable mass. [place] ordered due to slightly enlarged thyroid gland with subjective feeling of enlargement affecting swallowing.

## 2018-11-16 NOTE — Patient Instructions (Addendum)
Call and schedule with Dr. Benjie Karvonen at your convenience. Our records show that you are overdue for your pap smear and mammogram.   Try to increase your physical activity. Start with a 15-20 minute walk for 1-2 weeks and then gradually increase your time or distance.   Continue on your current medications and we will contact you with your results.     Preventive Care 8-53 Years Old, Female Preventive care refers to visits with your health care provider and lifestyle choices that can promote health and wellness. This includes:  A yearly physical exam. This may also be called an annual well check.  Regular dental visits and eye exams.  Immunizations.  Screening for certain conditions.  Healthy lifestyle choices, such as eating a healthy diet, getting regular exercise, not using drugs or products that contain nicotine and tobacco, and limiting alcohol use. What can I expect for my preventive care visit? Physical exam Your health care provider will check your:  Height and weight. This may be used to calculate body mass index (BMI), which tells if you are at a healthy weight.  Heart rate and blood pressure.  Skin for abnormal spots. Counseling Your health care provider may ask you questions about your:  Alcohol, tobacco, and drug use.  Emotional well-being.  Home and relationship well-being.  Sexual activity.  Eating habits.  Work and work Statistician.  Method of birth control.  Menstrual cycle.  Pregnancy history. What immunizations do I need?  Influenza (flu) vaccine  This is recommended every year. Tetanus, diphtheria, and pertussis (Tdap) vaccine  You may need a Td booster every 10 years. Varicella (chickenpox) vaccine  You may need this if you have not been vaccinated. Zoster (shingles) vaccine  You may need this after age 51. Measles, mumps, and rubella (MMR) vaccine  You may need at least one dose of MMR if you were born in 1957 or later. You may also  need a second dose. Pneumococcal conjugate (PCV13) vaccine  You may need this if you have certain conditions and were not previously vaccinated. Pneumococcal polysaccharide (PPSV23) vaccine  You may need one or two doses if you smoke cigarettes or if you have certain conditions. Meningococcal conjugate (MenACWY) vaccine  You may need this if you have certain conditions. Hepatitis A vaccine  You may need this if you have certain conditions or if you travel or work in places where you may be exposed to hepatitis A. Hepatitis B vaccine  You may need this if you have certain conditions or if you travel or work in places where you may be exposed to hepatitis B. Haemophilus influenzae type b (Hib) vaccine  You may need this if you have certain conditions. Human papillomavirus (HPV) vaccine  If recommended by your health care provider, you may need three doses over 6 months. You may receive vaccines as individual doses or as more than one vaccine together in one shot (combination vaccines). Talk with your health care provider about the risks and benefits of combination vaccines. What tests do I need? Blood tests  Lipid and cholesterol levels. These may be checked every 5 years, or more frequently if you are over 35 years old.  Hepatitis C test.  Hepatitis B test. Screening  Lung cancer screening. You may have this screening every year starting at age 22 if you have a 30-pack-year history of smoking and currently smoke or have quit within the past 15 years.  Colorectal cancer screening. All adults should have this screening starting  at age 44 and continuing until age 16. Your health care provider may recommend screening at age 81 if you are at increased risk. You will have tests every 1-10 years, depending on your results and the type of screening test.  Diabetes screening. This is done by checking your blood sugar (glucose) after you have not eaten for a while (fasting). You may have  this done every 1-3 years.  Mammogram. This may be done every 1-2 years. Talk with your health care provider about when you should start having regular mammograms. This may depend on whether you have a family history of breast cancer.  BRCA-related cancer screening. This may be done if you have a family history of breast, ovarian, tubal, or peritoneal cancers.  Pelvic exam and Pap test. This may be done every 3 years starting at age 63. Starting at age 12, this may be done every 5 years if you have a Pap test in combination with an HPV test. Other tests  Sexually transmitted disease (STD) testing.  Bone density scan. This is done to screen for osteoporosis. You may have this scan if you are at high risk for osteoporosis. Follow these instructions at home: Eating and drinking  Eat a diet that includes fresh fruits and vegetables, whole grains, lean protein, and low-fat dairy.  Take vitamin and mineral supplements as recommended by your health care provider.  Do not drink alcohol if: ? Your health care provider tells you not to drink. ? You are pregnant, may be pregnant, or are planning to become pregnant.  If you drink alcohol: ? Limit how much you have to 0-1 drink a day. ? Be aware of how much alcohol is in your drink. In the [place] one drink equals one 12 oz bottle of beer (355 mL), one 5 oz glass of wine (148 mL), or one 1 oz glass of hard liquor (44 mL). Lifestyle  Take daily care of your teeth and gums.  Stay active. Exercise for at least 30 minutes on 5 or more days each week.  Do not use any products that contain nicotine or tobacco, such as cigarettes, e-cigarettes, and chewing tobacco. If you need help quitting, ask your health care provider.  If you are sexually active, practice safe sex. Use a condom or other form of birth control (contraception) in order to prevent pregnancy and STIs (sexually transmitted infections).  If told by your health care provider, take  low-dose aspirin daily starting at age 41. What's next?  Visit your health care provider once a year for a well check visit.  Ask your health care provider how often you should have your eyes and teeth checked.  Stay up to date on all vaccines. This information is not intended to replace advice given to you by your health care provider. Make sure you discuss any questions you have with your health care provider. Document Released: 02/17/2015 Document Revised: 10/02/2017 Document Reviewed: 10/02/2017 Elsevier Patient Education  2020 [employer].

## 2018-11-17 ENCOUNTER — Encounter: Payer: Self-pay | Admitting: Family Medicine

## 2018-11-17 ENCOUNTER — Other Ambulatory Visit: Payer: Self-pay | Admitting: Family Medicine

## 2018-11-17 DIAGNOSIS — E039 Hypothyroidism, unspecified: Secondary | ICD-10-CM

## 2018-11-17 LAB — CBC WITH DIFFERENTIAL/PLATELET
Basophils Absolute: 0 10*3/uL (ref 0.0–0.2)
Basos: 0 %
EOS (ABSOLUTE): 0.1 10*3/uL (ref 0.0–0.4)
Eos: 1 %
Hematocrit: 39.1 % (ref 34.0–46.6)
Hemoglobin: 13.5 g/dL (ref 11.1–15.9)
Immature Grans (Abs): 0 10*3/uL (ref 0.0–0.1)
Immature Granulocytes: 0 %
Lymphocytes Absolute: 2.1 10*3/uL (ref 0.7–3.1)
Lymphs: 32 %
MCH: 31.1 pg (ref 26.6–33.0)
MCHC: 34.5 g/dL (ref 31.5–35.7)
MCV: 90 fL (ref 79–97)
Monocytes Absolute: 0.5 10*3/uL (ref 0.1–0.9)
Monocytes: 8 %
Neutrophils Absolute: 3.9 10*3/uL (ref 1.4–7.0)
Neutrophils: 59 %
Platelets: 285 10*3/uL (ref 150–450)
RBC: 4.34 x10E6/uL (ref 3.77–5.28)
RDW: 12.8 % (ref 11.7–15.4)
WBC: 6.7 10*3/uL (ref 3.4–10.8)

## 2018-11-17 LAB — LIPID PANEL
Chol/HDL Ratio: 4.9 ratio — ABNORMAL HIGH (ref 0.0–4.4)
Cholesterol, Total: 255 mg/dL — ABNORMAL HIGH (ref 100–199)
HDL: 52 mg/dL (ref >39)
LDL Chol Calc (NIH): 159 mg/dL — ABNORMAL HIGH (ref 0–99)
Triglycerides: 237 mg/dL — ABNORMAL HIGH (ref 0–149)
VLDL Cholesterol Cal: 44 mg/dL — ABNORMAL HIGH (ref 5–40)

## 2018-11-17 LAB — COMPREHENSIVE METABOLIC PANEL
ALT: 19 IU/L (ref 0–32)
AST: 22 IU/L (ref 0–40)
Albumin/Globulin Ratio: 1.9 (ref 1.2–2.2)
Albumin: 4.6 g/dL (ref 3.8–4.9)
Alkaline Phosphatase: 117 IU/L (ref 39–117)
BUN/Creatinine Ratio: 18 (ref 9–23)
BUN: 15 mg/dL (ref 6–24)
Bilirubin Total: 0.2 mg/dL (ref 0.0–1.2)
CO2: 25 mmol/L (ref 20–29)
Calcium: 9.7 mg/dL (ref 8.7–10.2)
Chloride: 101 mmol/L (ref 96–106)
Creatinine, Ser: 0.85 mg/dL (ref 0.57–1.00)
GFR calc Af Amer: 90 mL/min/{1.73_m2} (ref >59)
GFR calc non Af Amer: 78 mL/min/{1.73_m2} (ref >59)
Globulin, Total: 2.4 g/dL (ref 1.5–4.5)
Glucose: 82 mg/dL (ref 65–99)
Potassium: 4.2 mmol/L (ref 3.5–5.2)
Sodium: 141 mmol/L (ref 134–144)
Total Protein: 7 g/dL (ref 6.0–8.5)

## 2018-11-17 LAB — TSH: TSH: 0.523 u[IU]/mL (ref 0.450–4.500)

## 2018-11-17 LAB — VITAMIN D 25 HYDROXY (VIT D DEFICIENCY, FRACTURES): Vit D, 25-Hydroxy: 32.8 ng/mL (ref 30.0–100.0)

## 2018-11-17 MED ORDER — LEVOTHYROXINE SODIUM 88 MCG PO TABS: ORAL_TABLET | ORAL | 1 refills | Status: DC

## 2018-11-18 ENCOUNTER — Other Ambulatory Visit: Payer: Self-pay

## 2018-11-18 ENCOUNTER — Ambulatory Visit: Payer: BC Managed Care – PPO | Admitting: Physical Therapy

## 2018-11-18 DIAGNOSIS — M79602 Pain in left arm: Secondary | ICD-10-CM

## 2018-11-18 DIAGNOSIS — M6281 Muscle weakness (generalized): Secondary | ICD-10-CM

## 2018-11-18 DIAGNOSIS — M542 Cervicalgia: Secondary | ICD-10-CM

## 2018-11-18 DIAGNOSIS — M79601 Pain in right arm: Secondary | ICD-10-CM | POA: Diagnosis not present

## 2018-11-18 DIAGNOSIS — R209 Unspecified disturbances of skin sensation: Secondary | ICD-10-CM | POA: Diagnosis not present

## 2018-11-18 DIAGNOSIS — R278 Other lack of coordination: Secondary | ICD-10-CM | POA: Diagnosis not present

## 2018-11-18 NOTE — Therapy (Signed)
[facility] [address] [place], [place], [address] Phone: [phone]   Fax:  [phone]  Physical Therapy Treatment/Recert  Patient Details  Name: Stacey Gardner MRN: [id number] Date of Birth: August 04, 1965 Referring Provider (PT): Stacey Fret, MD   Encounter Date: 11/18/2018  PT End of Session - 11/18/18 1009    Visit Number  10    Number of Visits  18    Date for PT Re-Evaluation  12/23/18    Authorization Type  BCBS    PT Start Time  0915    PT Stop Time  1000    PT Time Calculation (min)  45 min    Activity Tolerance  Patient tolerated treatment well    Behavior During Therapy  [name] for tasks assessed/performed       Past Medical History:  Diagnosis Date  . Allergy   . Anemia   . Depression   . Hypothyroid   . Mixed hyperlipidemia 03/04/2016  . Vitamin D deficiency     Past Surgical History:  Procedure Laterality Date  . CERVICAL POLYPECTOMY      There were no vitals filed for this visit.  Subjective Assessment - 11/18/18 0938    Subjective  She relays no neck pain but having numbness in her Rt ring finger and feels weakness in her grip.    Pertinent History  Cubital tunnel syndrome    Diagnostic tests  OA of spine w/ cervical radiculopathy (Rt C5-6 impingement on recent MRI)    Patient Stated Goals  get the pain better    Currently in Pain?  Yes    Pain Score  2     Pain Location  Hand    Pain Orientation  Right    Pain Onset  More than a month ago         [facility] PT Assessment - 11/18/18 0001      Assessment   Medical Diagnosis  neck pain with radiculopathy    Referring Provider (PT)  Stacey Fret, MD      AROM   Cervical Flexion  WNL    Cervical Extension  WNL    Cervical - Right Side Bend  75%   tighntess reported   Cervical - Left Side Bend  75%   tightness reported   Cervical - Right Rotation  WNL    Cervical - Left Rotation  WNL      Strength   Overall Strength Comments   UE strength overall 4+/5                   [facility] Adult PT Treatment/Exercise - 11/18/18 0001      Exercises   Other Exercises   chin tucks X 20, nerve glides for median, nerve, radial X 10 ea      Traction   Type of Traction  Cervical    Max (lbs)  13-18    Time  20 min mechanical pre set program      Manual Therapy   Manual therapy comments  grade 5 manipulation in standing for Cerv-thoracic with one multilevel cavitation noted. supine C-T spinal mobs central and Rt unilat (had some arm pain with this around T3-4)             PT Education - 11/18/18 1009    Education Details  gave pictures for UE nerve glides, encouraged her to try DN, may need NCV test and rationale for this    Person(s) Educated  Patient    Methods  Explanation    Comprehension  Verbalized understanding          PT Long Term Goals - 11/18/18 1013      PT LONG TERM GOAL #1   Title  Pt will be I and compliant with HEP. (Target goal for all goals 6 weeks 11/11/18)    Status  Achieved      PT LONG TERM GOAL #2   Title  Pt will improve neck ROM to [facility] to improve mobility    Baseline  25% limited with side bending    Status  On-going      PT LONG TERM GOAL #3   Title  Pt will improve overall activity tolerance for funcitonal use of her arms and report improved sleeping quality.    Baseline  some improvement in activity tolerance but poor sleeping quality currently due to pain and discomfort    Status  Achieved      PT LONG TERM GOAL #4   Title  Pt will improve strength to at least 5-/5 MMT to improve function    Baseline  4+    Status  On-going      PT LONG TERM GOAL #5   Title  Pt will report decreased radiculopathy and pain overall in her arms and hands    Status  On-going            Plan - 11/18/18 1010    Clinical Impression Statement  Recert performed today and she has overall made good progress with her neck pain and radiculopathy however she does still have Numbness  in her Rt fingers. PT has helped with this and this is now intermittently but has been more over the last 2 weeks. She will continue to benefit from a few more weeks of PT and if the numbness does not resolve PT will refer back to MD and recommend NCV test.    Comorbidities  cubital tunnel syndrome    PT Frequency  2x / week    PT Duration  6 weeks    PT Treatment/Interventions  ADLs/Self Care Home Management;Cryotherapy;Electrical Stimulation;Iontophoresis 4mg /ml Dexamethasone;Moist Heat;Traction;Ultrasound;Therapeutic activities;Therapeutic exercise;Neuromuscular re-education;Manual techniques;Passive range of motion;Dry needling;Spinal Manipulations;Joint Manipulations;Taping    PT Next Visit Plan  cambell I set her up for one visit of DN to trial. Continue with traction, UE glides, and anything you want to add    PT Home Exercise Plan  Access Code: [email or web address], added bilat ER and mid trap/H abd with red       Patient will benefit from skilled therapeutic intervention in order to improve the following deficits and impairments:     Visit Diagnosis: Muscle weakness (generalized)  Pain in right arm  Pain in left arm  Cervicalgia     Problem List Patient Active Problem List   Diagnosis Date Noted  . Depression 11/16/2018  . Age-related cataract of right eye 11/16/2018  . Ulnar neuropathy at elbow, right 08/24/2018  . Neck pain 12/23/2017  . Vitamin D deficiency 03/04/2016  . Mixed hyperlipidemia 03/04/2016  . Muscle cramps 03/04/2016  . Paresthesias 03/04/2016  . Allergic rhinitis, cause unspecified 06/24/2013  . Hypothyroidism 06/24/2013    Stacey Gardner PT,DPT 11/18/2018, 10:14 AM  [place], [place], [address] Phone: [phone]   Fax:  [phone]  Name: Stacey Gardner MRN: [id number] Date of Birth: Jun 06, 1965

## 2018-11-23 ENCOUNTER — Other Ambulatory Visit: Payer: BC Managed Care – PPO

## 2018-11-24 ENCOUNTER — Telehealth: Payer: Self-pay | Admitting: Neurology

## 2018-11-24 ENCOUNTER — Ambulatory Visit
Admission: RE | Admit: 2018-11-24 | Discharge: 2018-11-24 | Disposition: A | Discharge status: Home / Self Care | Payer: BC Managed Care – PPO | Source: Ambulatory Visit | Attending: Family Medicine | Admitting: Family Medicine

## 2018-11-24 DIAGNOSIS — E049 Nontoxic goiter, unspecified: Secondary | ICD-10-CM | POA: Diagnosis not present

## 2018-11-24 NOTE — Telephone Encounter (Signed)
WOuld you mind calling patient and scheduling for emg/ncs in January? Thanks!

## 2018-11-25 ENCOUNTER — Other Ambulatory Visit: Payer: Self-pay

## 2018-11-25 ENCOUNTER — Encounter: Payer: Self-pay | Admitting: Physical Therapy

## 2018-11-25 ENCOUNTER — Ambulatory Visit: Payer: BC Managed Care – PPO | Admitting: Physical Therapy

## 2018-11-25 DIAGNOSIS — M79602 Pain in left arm: Secondary | ICD-10-CM

## 2018-11-25 DIAGNOSIS — M79601 Pain in right arm: Secondary | ICD-10-CM | POA: Diagnosis not present

## 2018-11-25 DIAGNOSIS — M542 Cervicalgia: Secondary | ICD-10-CM

## 2018-11-25 DIAGNOSIS — R208 Other disturbances of skin sensation: Secondary | ICD-10-CM

## 2018-11-25 DIAGNOSIS — R209 Unspecified disturbances of skin sensation: Secondary | ICD-10-CM | POA: Diagnosis not present

## 2018-11-25 DIAGNOSIS — M6281 Muscle weakness (generalized): Secondary | ICD-10-CM | POA: Diagnosis not present

## 2018-11-25 DIAGNOSIS — R278 Other lack of coordination: Secondary | ICD-10-CM

## 2018-11-25 NOTE — Therapy (Signed)
[place], [place], [address] Phone: [phone]   Fax:  [phone]  Physical Therapy Treatment  Patient Details  Name: Stacey Gardner MRN: [id number] Date of Birth: 15-Jun-1965 Referring Provider (PT): Melvenia Beam, MD   Encounter Date: 11/25/2018  PT End of Session - 11/25/18 0919    Visit Number  11    Number of Visits  18    Date for PT Re-Evaluation  12/23/18    Authorization Type  BCBS    PT Start Time  0918    PT Stop Time  1003    PT Time Calculation (min)  45 min    Activity Tolerance  Patient tolerated treatment well    Behavior During Therapy  [facility] for tasks assessed/performed       Past Medical History:  Diagnosis Date  . Allergy   . Anemia   . Depression   . Hypothyroid   . Mixed hyperlipidemia 03/04/2016  . Vitamin D deficiency     Past Surgical History:  Procedure Laterality Date  . CERVICAL POLYPECTOMY      There were no vitals filed for this visit.  Subjective Assessment - 11/25/18 0921    Subjective  "I am still having tightness in the R side of my neck. I still don't have any pain in the neck"    Patient Stated Goals  get the pain better    Currently in Pain?  No/denies    Pain Orientation  Right    Pain Onset  More than a month ago    Pain Frequency  Intermittent         [facility] PT Assessment - 11/25/18 0001      Assessment   Medical Diagnosis  neck pain with radiculopathy    Referring Provider (PT)  Melvenia Beam, MD                   [facility] Adult PT Treatment/Exercise - 11/25/18 0001      Neck Exercises: Supine   Neck Retraction  10 reps;5 secs      Manual Therapy   Manual Therapy  Manual Traction;Myofascial release;Soft tissue mobilization;Joint mobilization;Neural Stretch    Manual therapy comments  skilled palpation and monitoring of pt throughout TPDN    Joint Mobilization  grade III lateral gliades C3-C7 , R rib mob grade IIII     Soft tissue mobilization  iASTM along the SCM, Scalenes and R upper trap    Manual Traction  x 5 min gentle cervical traction    Neural Stretch  median nerve  and ulnar nerve glides on RUE      Neck Exercises: Stretches   Upper Trapezius Stretch  2 reps;30 seconds       Trigger Point Dry Needling - 11/25/18 0001    Consent Given?  Yes    Education Handout Provided  Yes    Muscles Treated Head and Neck  Scalenes;Sternocleidomastoid    Sternocleidomastoid Response  Twitch response elicited;Palpable increased muscle length   R   Scalenes Response  Twitch reponse elicited;Palpable increased muscle length   R          PT Education - 11/25/18 0958    Education Details  reviewed previous HEP and updated today.    Person(s) Educated  Patient    Methods  Explanation;Verbal cues;Handout    Comprehension  Verbalized understanding;Verbal cues required          PT Long Term Goals -  11/18/18 1013      PT LONG TERM GOAL #1   Title  Pt will be I and compliant with HEP. (Target goal for all goals 6 weeks 11/11/18)    Status  Achieved      PT LONG TERM GOAL #2   Title  Pt will improve neck ROM to [facility] to improve mobility    Baseline  25% limited with side bending    Status  On-going      PT LONG TERM GOAL #3   Title  Pt will improve overall activity tolerance for funcitonal use of her arms and report improved sleeping quality.    Baseline  some improvement in activity tolerance but poor sleeping quality currently due to pain and discomfort    Status  Achieved      PT LONG TERM GOAL #4   Title  Pt will improve strength to at least 5-/5 MMT to improve function    Baseline  4+    Status  On-going      PT LONG TERM GOAL #5   Title  Pt will report decreased radiculopathy and pain overall in her arms and hands    Status  On-going            Plan - 11/25/18 1010    Clinical Impression Statement  pt reports conitnued N into the R hand grossly. educated and consent was  provided for TPDN focusing on the scalenes and SCM on the R. continued cervical/ first rib mobs and manual traction which she did responded well to noting reduced N. continued working cervical posture with therex which she noted improvement of sympomts.    PT Treatment/Interventions  ADLs/Self Care Home Management;Cryotherapy;Electrical Stimulation;Iontophoresis 4mg /ml Dexamethasone;Moist Heat;Traction;Ultrasound;Therapeutic activities;Therapeutic exercise;Neuromuscular re-education;Manual techniques;Passive range of motion;Dry needling;Spinal Manipulations;Joint Manipulations;Taping    PT Next Visit Plan  response to DN, Continue with traction, UE glides, postural strengtheing, throacic mobiltiy,    PT Home Exercise Plan  Access Code: [email or web address], added bilat ER and mid trap/H abd with red, rib mobs    Consulted and Agree with Plan of Care  Patient       Patient will benefit from skilled therapeutic intervention in order to improve the following deficits and impairments:     Visit Diagnosis: Muscle weakness (generalized)  Pain in right arm  Pain in left arm  Cervicalgia  Other lack of coordination  Other disturbances of skin sensation     Problem List Patient Active Problem List   Diagnosis Date Noted  . Depression 11/16/2018  . Age-related cataract of right eye 11/16/2018  . Ulnar neuropathy at elbow, right 08/24/2018  . Neck pain 12/23/2017  . Vitamin D deficiency 03/04/2016  . Mixed hyperlipidemia 03/04/2016  . Muscle cramps 03/04/2016  . Paresthesias 03/04/2016  . Allergic rhinitis, cause unspecified 06/24/2013  . Hypothyroidism 06/24/2013   06/26/2013 PT, DPT, LAT, ATC  11/25/18  10:15 AM      [facility] [address] [place], [place], [place] Phone: [phone]   Fax:  [phone]  Name: Stacey Gardner MRN: Jerrye Beavers Date of Birth: Jun 11, 1965

## 2018-11-26 NOTE — Telephone Encounter (Signed)
I have called patient and LVM to schedule this.

## 2018-12-02 ENCOUNTER — Ambulatory Visit: Payer: BC Managed Care – PPO | Admitting: Physical Therapy

## 2018-12-02 ENCOUNTER — Other Ambulatory Visit: Payer: Self-pay

## 2018-12-02 ENCOUNTER — Encounter: Payer: BC Managed Care – PPO | Admitting: Physical Therapy

## 2018-12-02 DIAGNOSIS — M79601 Pain in right arm: Secondary | ICD-10-CM

## 2018-12-02 DIAGNOSIS — M79602 Pain in left arm: Secondary | ICD-10-CM | POA: Diagnosis not present

## 2018-12-02 DIAGNOSIS — M6281 Muscle weakness (generalized): Secondary | ICD-10-CM | POA: Diagnosis not present

## 2018-12-02 DIAGNOSIS — M542 Cervicalgia: Secondary | ICD-10-CM | POA: Diagnosis not present

## 2018-12-02 DIAGNOSIS — R278 Other lack of coordination: Secondary | ICD-10-CM | POA: Diagnosis not present

## 2018-12-02 DIAGNOSIS — R209 Unspecified disturbances of skin sensation: Secondary | ICD-10-CM | POA: Diagnosis not present

## 2018-12-02 NOTE — Therapy (Signed)
[place], [place], [address] Phone: [phone]   Fax:  [phone]  Physical Therapy Treatment  Patient Details  Name: Stacey Gardner MRN: [id number] Date of Birth: 04/25/1965 Referring Provider (PT): Melvenia Beam, MD   Encounter Date: 12/02/2018  PT End of Session - 12/02/18 1309    Visit Number  12    Number of Visits  18    Date for PT Re-Evaluation  12/23/18    Authorization Type  BCBS    PT Start Time  0933    PT Stop Time  1015    PT Time Calculation (min)  42 min    Activity Tolerance  Patient tolerated treatment well    Behavior During Therapy  [facility] for tasks assessed/performed       Past Medical History:  Diagnosis Date  . Allergy   . Anemia   . Depression   . Hypothyroid   . Mixed hyperlipidemia 03/04/2016  . Vitamin D deficiency     Past Surgical History:  Procedure Laterality Date  . CERVICAL POLYPECTOMY      There were no vitals filed for this visit.  Subjective Assessment - 12/02/18 1028    Subjective  Patient reports a significant improvement in pain and much less numbness  into her hands. She feels like the needling helped. She is doing all of her exercises.    Pertinent History  Cubital tunnel syndrome    Diagnostic tests  OA of spine w/ cervical radiculopathy (Rt C5-6 impingement on recent MRI)    Patient Stated Goals  get the pain better    Currently in Pain?  Yes    Pain Score  1     Pain Location  Neck    Pain Orientation  Right    Pain Descriptors / Indicators  Aching    Pain Type  Acute pain    Pain Radiating Towards  into the right arm but significantly improved    Pain Onset  More than a month ago    Pain Frequency  Intermittent    Aggravating Factors   worse when waking up    Pain Relieving Factors  heat and tens    Multiple Pain Sites  No                       [facility] Adult PT Treatment/Exercise - 12/02/18 0001      Self-Care    Self-Care  Other Self-Care Comments    Other Self-Care Comments   talked to patient about her HEP . She reports she is still doing 4 nerve glides 6 times a day. She was adsvised as long as her radiuclar sings are improved she does not need to do that. She will focus on her postrual band exercises and stretches. Therapy aslo reviewed the scalene stretch with her. She had some difficulty feeling the right stretch.       Exercises   Other Exercises   held 2nd to pain       Manual Therapy   Manual therapy comments  skilled palpation and monitoring of pt throughout TPDN    Joint Mobilization  Grade II and II PA mobilization to C-3-C4 C4-C5     Soft tissue mobilization  iASTM along the SCM, Scalenes and R upper trap    Manual Traction   min gentle cervical traction; sub occiital release       Neck Exercises: Stretches   Upper Trapezius Stretch  2 reps;30 seconds    Levator Stretch  2 reps;20 seconds    Other Neck Stretches  reviewed scalene stretch technique              PT Education - 12/02/18 1031    Education Details  benefits and risks of TPDN    Person(s) Educated  Patient    Methods  Explanation;Demonstration;Tactile cues;Verbal cues    Comprehension  Verbalized understanding;Returned demonstration;Verbal cues required;Tactile cues required          PT Long Term Goals - 12/02/18 1317      PT LONG TERM GOAL #1   Title  Pt will be I and compliant with HEP. (Target goal for all goals 6 weeks 11/11/18)    Status  Achieved      PT LONG TERM GOAL #2   Title  Pt will improve neck ROM to [facility] to improve mobility    Baseline  25% limited with side bending    Period  Weeks    Status  On-going      PT LONG TERM GOAL #3   Title  Pt will improve overall activity tolerance for funcitonal use of her arms and report improved sleeping quality.    Period  Weeks    Status  Achieved      PT LONG TERM GOAL #4   Title  Pt will improve strength to at least 5-/5 MMT to improve function     Period  Weeks    Status  On-going      PT LONG TERM GOAL #5   Title  Pt will report decreased radiculopathy and pain overall in her arms and hands    Status  On-going            Plan - 12/02/18 1312    Clinical Impression Statement  Patient tolerated treatment well she had a good twtich response to dry needling. She was shown stretches to work on. Therapy talked to her about posutre and use of her HEP at home. She continues to have trouble with chin tucks. She reports she has an enlarged thyroid which might be causing her difficulty with chin tucks. Therapy will review her ther-ex next visit.    Personal Factors and Comorbidities  Comorbidity 1    Comorbidities  cubital tunnel syndrome    Examination-Activity Limitations  Carry;Sleep;Lift;Caring for Others    Examination-Participation Restrictions  Meal Prep;Driving;Laundry    Stability/Clinical Decision Making  Evolving/Moderate complexity    Clinical Decision Making  Moderate    Rehab Potential  Good    PT Frequency  2x / week    PT Duration  6 weeks    PT Treatment/Interventions  ADLs/Self Care Home Management;Cryotherapy;Electrical Stimulation;Iontophoresis 4mg /ml Dexamethasone;Moist Heat;Traction;Ultrasound;Therapeutic activities;Therapeutic exercise;Neuromuscular re-education;Manual techniques;Passive range of motion;Dry needling;Spinal Manipulations;Joint Manipulations;Taping    PT Next Visit Plan  response to DN, Continue with traction, UE glides, postural strengtheing, throacic mobiltiy,    PT Home Exercise Plan  Access Code: [email or web address], added bilat ER and mid trap/H abd with red, rib mobs upper trap and levator stretch    Consulted and Agree with Plan of Care  Patient       Patient will benefit from skilled therapeutic intervention in order to improve the following deficits and impairments:  Decreased activity tolerance, Decreased strength, Decreased mobility, Hypomobility, Increased fascial restricitons, Increased muscle  spasms, Impaired flexibility, Pain  Visit Diagnosis: Pain in right arm  Muscle weakness (generalized)  Pain in left arm  Cervicalgia  Problem List Patient Active Problem List   Diagnosis Date Noted  . Depression 11/16/2018  . Age-related cataract of right eye 11/16/2018  . Ulnar neuropathy at elbow, right 08/24/2018  . Neck pain 12/23/2017  . Vitamin D deficiency 03/04/2016  . Mixed hyperlipidemia 03/04/2016  . Muscle cramps 03/04/2016  . Paresthesias 03/04/2016  . Allergic rhinitis, cause unspecified 06/24/2013  . Hypothyroidism 06/24/2013    Dessie Comaavid J Misti Towle PT DPT  12/02/2018, 1:20 PM  [facility] [address] [place], [place], [address] Phone: [phone]   Fax:  [phone]  Name: Stacey Gardner MRN: [id number] Date of Birth: 10/30/1965

## 2018-12-09 ENCOUNTER — Encounter: Payer: Self-pay | Admitting: Physical Therapy

## 2018-12-09 ENCOUNTER — Ambulatory Visit: Payer: BC Managed Care – PPO | Attending: Neurology | Admitting: Physical Therapy

## 2018-12-09 ENCOUNTER — Other Ambulatory Visit: Payer: Self-pay

## 2018-12-09 DIAGNOSIS — M542 Cervicalgia: Secondary | ICD-10-CM | POA: Diagnosis not present

## 2018-12-09 DIAGNOSIS — M79602 Pain in left arm: Secondary | ICD-10-CM | POA: Diagnosis not present

## 2018-12-09 DIAGNOSIS — M79601 Pain in right arm: Secondary | ICD-10-CM

## 2018-12-09 DIAGNOSIS — M6281 Muscle weakness (generalized): Secondary | ICD-10-CM

## 2018-12-09 DIAGNOSIS — R209 Unspecified disturbances of skin sensation: Secondary | ICD-10-CM | POA: Insufficient documentation

## 2018-12-09 NOTE — Therapy (Signed)
[facility] [address] [place], [place], [address] Phone: [phone]   Fax:  [phone]  Physical Therapy Treatment  Patient Details  Name: Stacey Gardner MRN: [id number] Date of Birth: Oct 29, 1965 Referring Provider (PT): Anson Fret, MD   Encounter Date: 12/09/2018  PT End of Session - 12/09/18 2044    Visit Number  13    Number of Visits  18    Date for PT Re-Evaluation  12/23/18    Authorization Type  BCBS    PT Start Time  1145    PT Stop Time  1240    PT Time Calculation (min)  55 min    Activity Tolerance  Patient tolerated treatment well    Behavior During Therapy  [facility] for tasks assessed/performed       Past Medical History:  Diagnosis Date  . Allergy   . Anemia   . Depression   . Hypothyroid   . Mixed hyperlipidemia 03/04/2016  . Vitamin D deficiency     Past Surgical History:  Procedure Laterality Date  . CERVICAL POLYPECTOMY      There were no vitals filed for this visit.  Subjective Assessment - 12/09/18 1652    Subjective  Patient reports her pain has been better. She has been having very little pain down the arm. She had some pain in her elbow cooking over the weekend. Patient brought her pillow and her exercises.    Pertinent History  Cubital tunnel syndrome    Diagnostic tests  OA of spine w/ cervical radiculopathy (Rt C5-6 impingement on recent MRI)    Patient Stated Goals  get the pain better                       [facility] Adult PT Treatment/Exercise - 12/09/18 0001      Self-Care   Other Self-Care Comments   pATIENT BROUGHT IN HER hep. Therapist wne through HEP page by page and reduced it to a managebale level. She was doing all over her exercises every day which amounted to likely too many exercises. Her HEP was simplified to 4 stretches and 4 exercises. She was adivsed to fiugre out which two stretches reduced her pain the most. She reports chin tucks hurt herdespite  trying different positions. These were removed. Therapy reviewed with her the anatomy of her neck and the resoning behind her mulligan mobilizations and her stretches. Therapy also looked at her pillow which is likely too bulky for her neck.      Manual Therapy   Manual therapy comments  skilled palpation and monitoring of pt throughout TPDN    Joint Mobilization  Grade II and III PA mobilization to C-3-C4 C4-C5     Soft tissue mobilization  iASTM along the SCM, Scalenes and R upper trap    Manual Traction   min gentle cervical traction; sub occiital release       Neck Exercises: Stretches   Upper Trapezius Stretch  2 reps;30 seconds    Levator Stretch  2 reps;20 seconds       Trigger Point Dry Needling - 12/09/18 0001    Consent Given?  Yes    Education Handout Provided  Yes    Muscles Treated Head and Neck  Cervical multifidi    Other Dry Needling  30x40 needle at c4 c5 c6     Cervical multifidi Response  Twitch reponse elicited           PT  Education - 12/09/18 1657    Education Details  benefits and risks of TPDN    Person(s) Educated  Patient    Methods  Demonstration;Explanation;Verbal cues;Tactile cues    Comprehension  Verbalized understanding;Returned demonstration;Verbal cues required;Tactile cues required          PT Long Term Goals - 12/02/18 1317      PT LONG TERM GOAL #1   Title  Pt will be I and compliant with HEP. (Target goal for all goals 6 weeks 11/11/18)    Status  Achieved      PT LONG TERM GOAL #2   Title  Pt will improve neck ROM to [facility] to improve mobility    Baseline  25% limited with side bending    Period  Weeks    Status  On-going      PT LONG TERM GOAL #3   Title  Pt will improve overall activity tolerance for funcitonal use of her arms and report improved sleeping quality.    Period  Weeks    Status  Achieved      PT LONG TERM GOAL #4   Title  Pt will improve strength to at least 5-/5 MMT to improve function    Period  Weeks     Status  On-going      PT LONG TERM GOAL #5   Title  Pt will report decreased radiculopathy and pain overall in her arms and hands    Status  On-going            Plan - 12/09/18 2028    Clinical Impression Statement  Therapy spent time going through her home exercises with her. She had several pages of exercises many similar exercises. She reports she is doing all of them . She was given a more focused proram based on postrual correction and stretching of her cervical spine. She was advised to pick 2 stretches and 3 exercises 2x daily. She also brought her pillow. She has a headsupport pillow but the edges are likely too big for her neck. It puts her in an extended position to sleep. She will try a seprerate pillow. Therapy performed needling of cervial multifidi. she has an area of restriction around c3-c4. Therapy showed her how to use her mulligan snags for that restriction in her spine.    Personal Factors and Comorbidities  Comorbidity 1    Comorbidities  cubital tunnel syndrome    Examination-Activity Limitations  Carry;Sleep;Lift;Caring for Others    Examination-Participation Restrictions  Meal Prep;Driving;Laundry    Stability/Clinical Decision Making  Evolving/Moderate complexity    Clinical Decision Making  Moderate    Rehab Potential  Good    PT Frequency  2x / week    PT Duration  6 weeks    PT Treatment/Interventions  ADLs/Self Care Home Management;Cryotherapy;Electrical Stimulation;Iontophoresis 4mg /ml Dexamethasone;Moist Heat;Traction;Ultrasound;Therapeutic activities;Therapeutic exercise;Neuromuscular re-education;Manual techniques;Passive range of motion;Dry needling;Spinal Manipulations;Joint Manipulations;Taping    PT Next Visit Plan  response to DN, Continue with traction, UE glides, postural strengtheing, throacic mobiltiy, Continue with dry needling; keep HEP at a reasonable size.    PT Home Exercise Plan  Access Code: [email or web address], added bilat ER and mid trap/H abd with  red, rib mobs upper trap and levator stretch    Consulted and Agree with Plan of Care  Patient       Patient will benefit from skilled therapeutic intervention in order to improve the following deficits and impairments:  Decreased activity tolerance, Decreased strength, Decreased mobility, Hypomobility, Increased  fascial restricitons, Increased muscle spasms, Impaired flexibility, Pain  Visit Diagnosis: Pain in right arm  Muscle weakness (generalized)  Pain in left arm  Cervicalgia     Problem List Patient Active Problem List   Diagnosis Date Noted  . Depression 11/16/2018  . Age-related cataract of right eye 11/16/2018  . Ulnar neuropathy at elbow, right 08/24/2018  . Neck pain 12/23/2017  . Vitamin D deficiency 03/04/2016  . Mixed hyperlipidemia 03/04/2016  . Muscle cramps 03/04/2016  . Paresthesias 03/04/2016  . Allergic rhinitis, cause unspecified 06/24/2013  . Hypothyroidism 06/24/2013    Dessie Comaavid J Cordero Surette PT DPT  12/09/2018, 8:55 PM  [facility] [address] [place], [place], [address] Phone: [phone]   Fax:  [phone]  Name: Stacey Gardner MRN: [id number] Date of Birth: 08/24/1965

## 2018-12-10 DIAGNOSIS — Z124 Encounter for screening for malignant neoplasm of cervix: Secondary | ICD-10-CM | POA: Diagnosis not present

## 2018-12-10 DIAGNOSIS — Z1231 Encounter for screening mammogram for malignant neoplasm of breast: Secondary | ICD-10-CM | POA: Diagnosis not present

## 2018-12-10 DIAGNOSIS — Z01419 Encounter for gynecological examination (general) (routine) without abnormal findings: Secondary | ICD-10-CM | POA: Diagnosis not present

## 2018-12-10 DIAGNOSIS — Z6825 Body mass index (BMI) 25.0-25.9, adult: Secondary | ICD-10-CM | POA: Diagnosis not present

## 2018-12-10 DIAGNOSIS — Z1151 Encounter for screening for human papillomavirus (HPV): Secondary | ICD-10-CM | POA: Diagnosis not present

## 2018-12-16 ENCOUNTER — Other Ambulatory Visit: Payer: Self-pay

## 2018-12-16 ENCOUNTER — Encounter: Payer: Self-pay | Admitting: Physical Therapy

## 2018-12-16 ENCOUNTER — Ambulatory Visit: Payer: BC Managed Care – PPO | Admitting: Physical Therapy

## 2018-12-16 DIAGNOSIS — M6281 Muscle weakness (generalized): Secondary | ICD-10-CM

## 2018-12-16 DIAGNOSIS — M79602 Pain in left arm: Secondary | ICD-10-CM | POA: Diagnosis not present

## 2018-12-16 DIAGNOSIS — R209 Unspecified disturbances of skin sensation: Secondary | ICD-10-CM | POA: Diagnosis not present

## 2018-12-16 DIAGNOSIS — M79601 Pain in right arm: Secondary | ICD-10-CM | POA: Diagnosis not present

## 2018-12-16 DIAGNOSIS — M542 Cervicalgia: Secondary | ICD-10-CM | POA: Diagnosis not present

## 2018-12-16 NOTE — Telephone Encounter (Signed)
I called the pt just to see if she'd like to move up her appt or keep in January. LVM asking for call back.

## 2018-12-16 NOTE — Therapy (Signed)
[place], [place], [address] Phone: [phone]   Fax:  [phone]  Physical Therapy Treatment  Patient Details  Name: Stacey Gardner MRN: [id number] Date of Birth: 04/06/1965 Referring Provider (PT): Melvenia Beam, MD   Encounter Date: 12/16/2018  PT End of Session - 12/16/18 1503    Visit Number  14    Number of Visits  18    Date for PT Re-Evaluation  12/23/18    Authorization Type  BCBS    PT Start Time  1503    PT Stop Time  1549    PT Time Calculation (min)  46 min       Past Medical History:  Diagnosis Date  . Allergy   . Anemia   . Depression   . Hypothyroid   . Mixed hyperlipidemia 03/04/2016  . Vitamin D deficiency     Past Surgical History:  Procedure Laterality Date  . CERVICAL POLYPECTOMY      There were no vitals filed for this visit.  Subjective Assessment - 12/16/18 1506    Subjective  " I was really nervous over the last weekend I had some increase in symptoms, but I did my exercises and I am doing much better"    Diagnostic tests  OA of spine w/ cervical radiculopathy (Rt C5-6 impingement on recent MRI)         [facility] PT Assessment - 12/16/18 0001      Assessment   Medical Diagnosis  neck pain with radiculopathy    Referring Provider (PT)  Melvenia Beam, MD                   [facility] Adult PT Treatment/Exercise - 12/16/18 0001      Traction   Type of Traction  Cervical    Min (lbs)  15    Max (lbs)  20    Hold Time  60    Rest Time  20    Time  10      Manual Therapy   Manual Therapy  Other (comment)    Manual therapy comments  skilled palpation and monitoring of pt throughout TPDN    Joint Mobilization  Grade  III PA mobilization to C3-C7, T1-T5, bil first rib mobs    Soft tissue mobilization  iASTM R upper trap and R sub-occipitals    Other Manual Therapy  R upper trap inhibition taping      Neck Exercises: Stretches   Upper  Trapezius Stretch  2 reps;30 seconds    Levator Stretch  2 reps;20 seconds       Trigger Point Dry Needling - 12/16/18 0001    Consent Given?  Yes    Education Handout Provided  Previously provided    Muscles Treated Head and Neck  Upper trapezius;Levator scapulae    Upper Trapezius Response  Twitch reponse elicited;Palpable increased muscle length   R sided only   Levator Scapulae Response  Twitch response elicited;Palpable increased muscle length   R sided only               PT Long Term Goals - 12/02/18 1317      PT LONG TERM GOAL #1   Title  Pt will be I and compliant with HEP. (Target goal for all goals 6 weeks 11/11/18)    Status  Achieved      PT LONG TERM GOAL #2   Title  Pt will improve neck ROM to  WFL to improve mobility    Baseline  25% limited with side bending    Period  Weeks    Status  On-going      PT LONG TERM GOAL #3   Title  Pt will improve overall activity tolerance for funcitonal use of her arms and report improved sleeping quality.    Period  Weeks    Status  Achieved      PT LONG TERM GOAL #4   Title  Pt will improve strength to at least 5-/5 MMT to improve function    Period  Weeks    Status  On-going      PT LONG TERM GOAL #5   Title  Pt will report decreased radiculopathy and pain overall in her arms and hands    Status  On-going            Plan - 12/16/18 1541    Clinical Impression Statement  Mrs Scheib reported some increase in symptoms in the R hand/ 1st MCP joint and noted some sorenss int he shoulder a few days ago but reported it had improved with her HEP. continued TPDN focusing on the R upper trap/ levator scapuale followed with IASTM and cervicothoracic mobs. continued mechanical trcation increasing tension today.    PT Next Visit Plan  response to DN, Continue with traction,posture and lifting mechanics, throacic mobiltiy, Continue with dry needling; keep HEP at a reasonable size.    PT Home Exercise Plan  Access  Code: [email or web address], added bilat ER and mid trap/H abd with red, rib mobs upper trap and levator stretch    Consulted and Agree with Plan of Care  Patient       Patient will benefit from skilled therapeutic intervention in order to improve the following deficits and impairments:     Visit Diagnosis: Pain in right arm  Muscle weakness (generalized)  Pain in left arm  Cervicalgia     Problem List Patient Active Problem List   Diagnosis Date Noted  . Depression 11/16/2018  . Age-related cataract of right eye 11/16/2018  . Ulnar neuropathy at elbow, right 08/24/2018  . Neck pain 12/23/2017  . Vitamin D deficiency 03/04/2016  . Mixed hyperlipidemia 03/04/2016  . Muscle cramps 03/04/2016  . Paresthesias 03/04/2016  . Allergic rhinitis, cause unspecified 06/24/2013  . Hypothyroidism 06/24/2013   Lulu Riding PT, DPT, LAT, ATC  12/16/18  3:45 PM      [facility] [address] [place], [place], [address] Phone: [phone]   Fax:  [phone]  Name: Stacey Gardner MRN: [id number] Date of Birth: [birth year]

## 2018-12-17 LAB — HM PAP SMEAR: HM Pap smear: NEGATIVE

## 2018-12-23 ENCOUNTER — Other Ambulatory Visit: Payer: Self-pay

## 2018-12-23 ENCOUNTER — Ambulatory Visit: Payer: BC Managed Care – PPO | Admitting: Physical Therapy

## 2018-12-23 DIAGNOSIS — R209 Unspecified disturbances of skin sensation: Secondary | ICD-10-CM | POA: Diagnosis not present

## 2018-12-23 DIAGNOSIS — M79602 Pain in left arm: Secondary | ICD-10-CM | POA: Diagnosis not present

## 2018-12-23 DIAGNOSIS — M6281 Muscle weakness (generalized): Secondary | ICD-10-CM

## 2018-12-23 DIAGNOSIS — M542 Cervicalgia: Secondary | ICD-10-CM

## 2018-12-23 DIAGNOSIS — R208 Other disturbances of skin sensation: Secondary | ICD-10-CM

## 2018-12-23 DIAGNOSIS — M79601 Pain in right arm: Secondary | ICD-10-CM | POA: Diagnosis not present

## 2018-12-23 NOTE — Therapy (Signed)
[facility] [address] [place], [place], [address] Phone: [phone]   Fax:  [phone]  Physical Therapy Treatment  Patient Details  Name: Stacey Gardner MRN: [id number] Date of Birth: 1965-12-12 Referring Provider (PT): Anson Fret, MD   Encounter Date: 12/23/2018  PT End of Session - 12/23/18 1501    Visit Number  15    Number of Visits  18    Date for PT Re-Evaluation  12/23/18    Authorization Type  BCBS    PT Start Time  1415    PT Stop Time  1459    PT Time Calculation (min)  44 min       Past Medical History:  Diagnosis Date  . Allergy   . Anemia   . Depression   . Hypothyroid   . Mixed hyperlipidemia 03/04/2016  . Vitamin D deficiency     Past Surgical History:  Procedure Laterality Date  . CERVICAL POLYPECTOMY      There were no vitals filed for this visit.  Subjective Assessment - 12/23/18 1419    Subjective  " Saturday I did get some of the trigger finger again in the R  ring finger which got better with stretching. I do still get some stiffness in the neck"         [facility] PT Assessment - 12/23/18 0001      Assessment   Medical Diagnosis  neck pain with radiculopathy    Referring Provider (PT)  Anson Fret, MD                   [facility] Adult PT Treatment/Exercise - 12/23/18 0001      Shoulder Exercises: Standing   Protraction  Strengthening;10 reps   cues for proper form   Other Standing Exercises  lower trap wall Y's 2 x 10 holding 5 seconds ea.      Traction   Type of Traction  Cervical    Min (lbs)  15    Max (lbs)  20    Hold Time  60    Rest Time  20    Time  10      Manual Therapy   Manual therapy comments  skilled palpation and monitoring of pt throughout TPDN    Joint Mobilization  Grade  III PA mobilization to C3-C7,bil first rib mobs    Soft tissue mobilization  iASTM R upper trap     Other Manual Therapy  R upper trap inhibition taping      Neck Exercises: Stretches   Upper Trapezius Stretch  2 reps;30 seconds;Right    Levator Stretch  2 reps;20 seconds       Trigger Point Dry Needling - 12/23/18 0001    Consent Given?  Yes    Education Handout Provided  Previously provided    Upper Trapezius Response  Twitch reponse elicited;Palpable increased muscle length    Levator Scapulae Response  Twitch response elicited;Palpable increased muscle length           PT Education - 12/23/18 1450    Education Details  updated HEP for wall push-up and lower trap strengthening.    Person(s) Educated  Patient    Methods  Explanation;Verbal cues;Handout    Comprehension  Verbalized understanding;Verbal cues required          PT Long Term Goals - 12/02/18 1317      PT LONG TERM GOAL #1   Title  Pt will be I  and compliant with HEP. (Target goal for all goals 6 weeks 11/11/18)    Status  Achieved      PT LONG TERM GOAL #2   Title  Pt will improve neck ROM to [facility] to improve mobility    Baseline  25% limited with side bending    Period  Weeks    Status  On-going      PT LONG TERM GOAL #3   Title  Pt will improve overall activity tolerance for funcitonal use of her arms and report improved sleeping quality.    Period  Weeks    Status  Achieved      PT LONG TERM GOAL #4   Title  Pt will improve strength to at least 5-/5 MMT to improve function    Period  Weeks    Status  On-going      PT LONG TERM GOAL #5   Title  Pt will report decreased radiculopathy and pain overall in her arms and hands    Status  On-going            Plan - 12/23/18 1452    Clinical Impression Statement  pt reports improvement today but does have continued N/T especially with sleeping positioning. continued TPDN focusing on the R upper trap/ levator scapulae followed with IASTM techniques and cervical mobs. continued inhibition tpain to relief tension in the L uppe rtrap and worked on shoulder stability exercises which she noted felt good. no  report of pain or N/T into the elbow/ wrist or hand.    PT Next Visit Plan  ERO, response to DN, Continue with traction,posture and lifting mechanics, throacic mobiltiy, Continue with dry needling; keep HEP at a reasonable size. review pillow, dicsuss adding more visits?    PT Home Exercise Plan  Access Code: [email or web address], added bilat ER and mid trap/H abd with red, rib mobs upper trap and levator stretch, serratus punches, lower trap wall y's    Consulted and Agree with Plan of Care  Patient       Patient will benefit from skilled therapeutic intervention in order to improve the following deficits and impairments:     Visit Diagnosis: Pain in right arm  Muscle weakness (generalized)  Pain in left arm  Cervicalgia  Other disturbances of skin sensation     Problem List Patient Active Problem List   Diagnosis Date Noted  . Depression 11/16/2018  . Age-related cataract of right eye 11/16/2018  . Ulnar neuropathy at elbow, right 08/24/2018  . Neck pain 12/23/2017  . Vitamin D deficiency 03/04/2016  . Mixed hyperlipidemia 03/04/2016  . Muscle cramps 03/04/2016  . Paresthesias 03/04/2016  . Allergic rhinitis, cause unspecified 06/24/2013  . Hypothyroidism 06/24/2013   Starr Lake PT, DPT, LAT, ATC  12/23/18  3:02 PM      [facility] [address] [place], [place], [address] Phone: [phone]   Fax:  [phone]  Name: Stacey Gardner MRN: [id number] Date of Birth: 06-Sep-1965

## 2018-12-30 ENCOUNTER — Encounter: Payer: Self-pay | Admitting: Physical Therapy

## 2018-12-30 ENCOUNTER — Ambulatory Visit: Payer: BC Managed Care – PPO | Admitting: Physical Therapy

## 2018-12-30 ENCOUNTER — Other Ambulatory Visit: Payer: Self-pay

## 2018-12-30 DIAGNOSIS — R208 Other disturbances of skin sensation: Secondary | ICD-10-CM

## 2018-12-30 DIAGNOSIS — M79601 Pain in right arm: Secondary | ICD-10-CM | POA: Diagnosis not present

## 2018-12-30 DIAGNOSIS — M542 Cervicalgia: Secondary | ICD-10-CM | POA: Diagnosis not present

## 2018-12-30 DIAGNOSIS — M6281 Muscle weakness (generalized): Secondary | ICD-10-CM

## 2018-12-30 DIAGNOSIS — M79602 Pain in left arm: Secondary | ICD-10-CM | POA: Diagnosis not present

## 2018-12-30 DIAGNOSIS — R209 Unspecified disturbances of skin sensation: Secondary | ICD-10-CM | POA: Diagnosis not present

## 2018-12-30 NOTE — Therapy (Signed)
[place], [place], [address] Phone: [phone]   Fax:  [phone]  Physical Therapy Treatment / Re-certification  Patient Details  Name: Stacey Gardner MRN: [id number] Date of Birth: 1965-08-07 Referring Provider (PT): Melvenia Beam, MD   Encounter Date: 12/30/2018  PT End of Session - 12/30/18 1417    Visit Number  16    Number of Visits  19    Date for PT Re-Evaluation  01/20/19    Authorization Type  BCBS    PT Start Time  1416    PT Stop Time  1456    PT Time Calculation (min)  40 min    Activity Tolerance  Patient tolerated treatment well    Behavior During Therapy  [facility] for tasks assessed/performed       Past Medical History:  Diagnosis Date  . Allergy   . Anemia   . Depression   . Hypothyroid   . Mixed hyperlipidemia 03/04/2016  . Vitamin D deficiency     Past Surgical History:  Procedure Laterality Date  . CERVICAL POLYPECTOMY      There were no vitals filed for this visit.  Subjective Assessment - 12/30/18 1417    Subjective  "I still get some soreness in the shoulder and into the elbow    Currently in Pain?  Yes    Pain Score  2     Pain Location  Neck    Pain Orientation  Right         [facility] PT Assessment - 12/30/18 0001      Assessment   Medical Diagnosis  neck pain with radiculopathy    Referring Provider (PT)  Melvenia Beam, MD      Strength   Overall Strength Comments  UE strength overall 4+/5                   [facility] Adult PT Treatment/Exercise - 12/30/18 0001      Exercises   Exercises  Elbow      Elbow Exercises   Forearm Pronation  Strengthening;Right;10 reps;Seated    Other elbow exercises  wrist flexor stretch 2 x 30 sec      Manual Therapy   Soft tissue mobilization  IASTM along the R pronator teres       Neck Exercises: Stretches   Upper Trapezius Stretch  2 reps;30 seconds;Right    Levator Stretch  2 reps;20 seconds        Trigger Point Dry Needling - 12/30/18 0001    Consent Given?  Yes    Education Handout Provided  Previously provided    Muscles Treated Wrist/Hand  Pronator teres    Pronator teres Response  Twitch response elicited;Palpable increased muscle length   R only          PT Education - 12/30/18 1455    Education Details  extensively reviewed all HEP, updated to include elbow pronation/ supination and wrist flexor stretching. reviewed sleeping positioning with pt's personal pillow. Anatomy of the wrist/ elbow and effect of musculature    Person(s) Educated  Patient    Methods  Explanation;Handout;Verbal cues    Comprehension  Verbalized understanding;Verbal cues required          PT Long Term Goals - 12/30/18 1423      PT LONG TERM GOAL #1   Title  Pt will be I and compliant with HEP. (Target goal for all goals 6 weeks 11/11/18)  Period  Weeks    Status  Achieved      PT LONG TERM GOAL #2   Title  Pt will improve neck ROM to [facility] to improve mobility    Period  Weeks    Status  On-going      PT LONG TERM GOAL #3   Title  Pt will improve overall activity tolerance for funcitonal use of her arms and report improved sleeping quality.    Period  Weeks    Status  Achieved      PT LONG TERM GOAL #4   Title  Pt will improve strength to at least 5-/5 MMT to improve function    Period  Weeks    Status  On-going      PT LONG TERM GOAL #5   Title  Pt will report decreased radiculopathy and pain overall in her arms and hands    Period  Weeks    Status  Achieved            Plan - 12/30/18 1457    Clinical Impression Statement  pt report continued fluctuating symptoms in the neck and elbow. further assessment revealed potential medial epicondylitis and increased tension along the pronator teres. TPDN performed along the pronator teres followed with IASTM techniques which she noted relief of pain/ stiffness. plan to continued with current treatment plan 1 x a week for 3  weeks to address both neck and elbow and work toward independent exercise.    PT Frequency  1x / week    PT Duration  3 weeks    PT Treatment/Interventions  ADLs/Self Care Home Management;Cryotherapy;Electrical Stimulation;Iontophoresis 4mg /ml Dexamethasone;Moist Heat;Traction;Ultrasound;Therapeutic activities;Therapeutic exercise;Neuromuscular re-education;Manual techniques;Passive range of motion;Dry needling;Spinal Manipulations;Joint Manipulations;Taping    PT Next Visit Plan  response to DN, Continue with traction,posture and lifting mechanics, throacic mobiltiy, Continue with dry needling; keep HEP at a reasonable size. hows elbow exercisese. potentail medial epicondylitis    PT Home Exercise Plan  Access Code: [email or web address], added bilat ER and mid trap/H abd with red, rib mobs upper trap and levator stretch, serratus punches, lower trap wall y's, wrist flexor stretching, pronation/ supination    Consulted and Agree with Plan of Care  Patient       Patient will benefit from skilled therapeutic intervention in order to improve the following deficits and impairments:  Decreased activity tolerance, Decreased strength, Decreased mobility, Hypomobility, Increased fascial restricitons, Increased muscle spasms, Impaired flexibility, Pain  Visit Diagnosis: Pain in right arm  Muscle weakness (generalized)  Cervicalgia  Other disturbances of skin sensation     Problem List Patient Active Problem List   Diagnosis Date Noted  . Depression 11/16/2018  . Age-related cataract of right eye 11/16/2018  . Ulnar neuropathy at elbow, right 08/24/2018  . Neck pain 12/23/2017  . Vitamin D deficiency 03/04/2016  . Mixed hyperlipidemia 03/04/2016  . Muscle cramps 03/04/2016  . Paresthesias 03/04/2016  . Allergic rhinitis, cause unspecified 06/24/2013  . Hypothyroidism 06/24/2013   06/26/2013 PT, DPT, LAT, ATC  12/30/18  3:00 PM      [facility] [address] [place], [place], [place] Phone: [phone]   Fax:  [phone]  Name: Stacey Gardner MRN: Jerrye Beavers Date of Birth: 1965-10-02

## 2019-01-08 ENCOUNTER — Encounter: Payer: Self-pay | Admitting: Physical Therapy

## 2019-01-08 ENCOUNTER — Ambulatory Visit: Payer: BC Managed Care – PPO | Attending: Neurology | Admitting: Physical Therapy

## 2019-01-08 ENCOUNTER — Other Ambulatory Visit: Payer: Self-pay

## 2019-01-08 DIAGNOSIS — M6281 Muscle weakness (generalized): Secondary | ICD-10-CM | POA: Diagnosis not present

## 2019-01-08 DIAGNOSIS — M542 Cervicalgia: Secondary | ICD-10-CM

## 2019-01-08 DIAGNOSIS — M79601 Pain in right arm: Secondary | ICD-10-CM | POA: Diagnosis not present

## 2019-01-08 NOTE — Therapy (Addendum)
[place], [place], [address] Phone: [phone]   Fax:  [phone]  Physical Therapy Treatment / Discharge  Patient Details  Name: Stacey Gardner MRN: [id number] Date of Birth: [birth year] Referring Provider (PT): Melvenia Beam, MD   Encounter Date: 01/08/2019  PT End of Session - 01/08/19 0919    Visit Number  17    Number of Visits  19    Date for PT Re-Evaluation  01/20/19    Authorization Type  BCBS    PT Start Time  0919    PT Stop Time  0959    PT Time Calculation (min)  40 min    Activity Tolerance  Patient tolerated treatment well    Behavior During Therapy  [facility] for tasks assessed/performed       Past Medical History:  Diagnosis Date  . Allergy   . Anemia   . Depression   . Hypothyroid   . Mixed hyperlipidemia 03/04/2016  . Vitamin D deficiency     Past Surgical History:  Procedure Laterality Date  . CERVICAL POLYPECTOMY      There were no vitals filed for this visit.  Subjective Assessment - 01/08/19 0922    Subjective  "since the last session I am doing better in the elbow. I still get some soreness in the elbow intermittently    Currently in Pain?  Yes    Pain Score  0-No pain         [facility] PT Assessment - 01/08/19 0001      Assessment   Medical Diagnosis  neck pain with radiculopathy    Referring Provider (PT)  Melvenia Beam, MD                   [facility] Adult PT Treatment/Exercise - 01/08/19 0001      Elbow Exercises   Forearm Pronation  Strengthening;Right;10 reps;Seated   cues to go through full ROM.    Other elbow exercises  wrist flexion 1 x 15 with 3# focusing on controlled eccentric lowering    Other elbow exercises  wrist flexor stretch 2 x 30 sec      Modalities   Modalities  Ultrasound      Ultrasound   Ultrasound Location  1 mhz x 1.0 w/cm2 x 8 min for R wrist flexors      Manual Therapy   Manual therapy comments  skilled  palpation and monitoring of pt throughout TPDN    Joint Mobilization  Grade  III PA mobilization to C3-C7, medial/ lateral elbow joint mobs grade III    Soft tissue mobilization  IASTM along the R pronator teres / wrist flexors    Other Manual Therapy  R elbow medial epicondyle strap (made from pre-wrap)       Neck Exercises: Stretches   Upper Trapezius Stretch  1 rep;Right;30 seconds    Levator Stretch  1 rep;30 seconds       Trigger Point Dry Needling - 01/08/19 0001    Muscles Treated Wrist/Hand  Flexor carpi ulnaris;Flexor carpi radialis    Flexor carpi radialis Response  Twitch response elicited;Palpable increased muscle length    Flexor carpi ulnaris Response  Twitch response elicited;Palpable increased muscle length    Pronator teres Response  Twitch response elicited;Palpable increased muscle length           PT Education - 01/08/19 1042    Education Details  reviewed HEP and benefits of strap for the relief  of elbow tightness/pain.    Person(s) Educated  Patient    Methods  Explanation;Verbal cues;Handout    Comprehension  Verbalized understanding;Verbal cues required          PT Long Term Goals - 12/30/18 1423      PT LONG TERM GOAL #1   Title  Pt will be I and compliant with HEP. (Target goal for all goals 6 weeks 11/11/18)    Period  Weeks    Status  Achieved      PT LONG TERM GOAL #2   Title  Pt will improve neck ROM to [facility] to improve mobility    Period  Weeks    Status  On-going      PT LONG TERM GOAL #3   Title  Pt will improve overall activity tolerance for funcitonal use of her arms and report improved sleeping quality.    Period  Weeks    Status  Achieved      PT LONG TERM GOAL #4   Title  Pt will improve strength to at least 5-/5 MMT to improve function    Period  Weeks    Status  On-going      PT LONG TERM GOAL #5   Title  Pt will report decreased radiculopathy and pain overall in her arms and hands    Period  Weeks    Status  Achieved             Plan - 01/08/19 1039    Clinical Impression Statement  pt arrived today noting no issues in the R shoulder and improvement in the elbow despite having some fluctuating symtpoms. Continued TPDN focusing on the R elbow flexors/ pronators followed with IASTM, and mobs. She noted relief following [place] and was able to complete exercises with no report of pain. trialed make shift strap to relieve tension on the medial epicondyle which she tolerated well.    PT Next Visit Plan  response to DN,, throacic mobiltiy,  hows elbow exercisese. potentail medial epicondylitis, how was wrap, RUE gross strengthening.    PT Home Exercise Plan  Access Code: [email or web address], added bilat ER and mid trap/H abd with red, rib mobs upper trap and levator stretch, serratus punches, lower trap wall y's, wrist flexor stretching, pronation/ supination       Patient will benefit from skilled therapeutic intervention in order to improve the following deficits and impairments:  Decreased activity tolerance, Decreased strength, Decreased mobility, Hypomobility, Increased fascial restricitons, Increased muscle spasms, Impaired flexibility, Pain  Visit Diagnosis: Pain in right arm  Muscle weakness (generalized)  Cervicalgia     Problem List Patient Active Problem List   Diagnosis Date Noted  . Depression 11/16/2018  . Age-related cataract of right eye 11/16/2018  . Ulnar neuropathy at elbow, right 08/24/2018  . Neck pain 12/23/2017  . Vitamin D deficiency 03/04/2016  . Mixed hyperlipidemia 03/04/2016  . Muscle cramps 03/04/2016  . Paresthesias 03/04/2016  . Allergic rhinitis, cause unspecified 06/24/2013  . Hypothyroidism 06/24/2013    Starr Lake PT, DPT, LAT, ATC  01/08/19  10:46 AM      [place] [facility] [address] [place], [place], [address] Phone: [phone]   Fax:  [phone]  Name: Stacey Gardner MRN: [id number] Date of Birth:  1965/10/25      PHYSICAL THERAPY DISCHARGE SUMMARY  Visits from Start of Care: 17  Current functional level related to goals / functional outcomes: See goals   Remaining deficits: Current status  unknown   Education / Equipment: HEP, theraband, posture  Plan: Patient agrees to discharge.  Patient goals were partially met. Patient is being discharged due to not returning since the last visit.  ?????          Kristoffer Leamon PT, DPT, LAT, ATC  02/16/19  2:47 PM

## 2019-01-13 ENCOUNTER — Encounter: Payer: BC Managed Care – PPO | Admitting: Physical Therapy

## 2019-01-20 ENCOUNTER — Encounter: Payer: BC Managed Care – PPO | Admitting: Physical Therapy

## 2019-02-10 ENCOUNTER — Telehealth: Payer: Self-pay | Admitting: Neurology

## 2019-02-10 NOTE — Telephone Encounter (Signed)
I called patient and LVM to reschedule NCV/EMG for tomorrow, 1/7, d/t technician out unexpectedly. Requested patient call back.

## 2019-02-11 ENCOUNTER — Encounter: Payer: BC Managed Care – PPO | Admitting: Neurology

## 2019-03-03 DIAGNOSIS — M25522 Pain in left elbow: Secondary | ICD-10-CM | POA: Diagnosis not present

## 2019-03-03 DIAGNOSIS — G5621 Lesion of ulnar nerve, right upper limb: Secondary | ICD-10-CM | POA: Diagnosis not present

## 2019-03-03 DIAGNOSIS — M25521 Pain in right elbow: Secondary | ICD-10-CM | POA: Diagnosis not present

## 2019-03-16 ENCOUNTER — Ambulatory Visit: Payer: BC Managed Care – PPO | Admitting: Occupational Therapy

## 2019-03-25 ENCOUNTER — Encounter: Payer: Self-pay | Admitting: Family Medicine

## 2019-03-29 ENCOUNTER — Telehealth: Payer: Self-pay | Admitting: Neurology

## 2019-03-29 NOTE — Telephone Encounter (Signed)
Called patient to reschedule the canceled 1/7 NCV/EMG due to tech being out that day. LVM requesting she call back to reschedule.

## 2019-04-12 ENCOUNTER — Ambulatory Visit: Payer: BC Managed Care – PPO | Admitting: Physical Therapy

## 2019-04-13 ENCOUNTER — Encounter: Payer: Self-pay | Admitting: Physical Therapy

## 2019-04-13 ENCOUNTER — Ambulatory Visit: Payer: BC Managed Care – PPO | Attending: Orthopedic Surgery | Admitting: Physical Therapy

## 2019-04-13 ENCOUNTER — Other Ambulatory Visit: Payer: Self-pay

## 2019-04-13 DIAGNOSIS — R209 Unspecified disturbances of skin sensation: Secondary | ICD-10-CM | POA: Diagnosis not present

## 2019-04-13 DIAGNOSIS — M79601 Pain in right arm: Secondary | ICD-10-CM | POA: Insufficient documentation

## 2019-04-13 DIAGNOSIS — M6281 Muscle weakness (generalized): Secondary | ICD-10-CM

## 2019-04-13 DIAGNOSIS — M542 Cervicalgia: Secondary | ICD-10-CM

## 2019-04-13 NOTE — Therapy (Signed)
[facility] [place], [place], [address] Phone: [phone]   Fax:  [phone]  Physical Therapy Evaluation  Patient Details  Name: Stacey Gardner MRN: [id number] Date of Birth: 09-11-1965 Referring Provider (PT): Roseanne Kaufman, MD    Encounter Date: 04/13/2019  PT End of Session - 04/13/19 1421    Visit Number  1    Number of Visits  13    Date for PT Re-Evaluation  05/04/19    Authorization Type  BCBS    PT Start Time  2778    PT Stop Time  1457    PT Time Calculation (min)  40 min    Activity Tolerance  Patient tolerated treatment well    Behavior During Therapy  [facility] for tasks assessed/performed       Past Medical History:  Diagnosis Date  . Allergy   . Anemia   . Depression   . Hypothyroid   . Mixed hyperlipidemia 03/04/2016  . Vitamin D deficiency     Past Surgical History:  Procedure Laterality Date  . CERVICAL POLYPECTOMY      There were no vitals filed for this visit.   Subjective Assessment - 04/13/19 1422    Subjective  pt is a 54 y.o F With CC or RUE pain with referral down into the wrist and hand. She had previously been seen at PT which has helped but she had to stop due insurance restirictions/ limitations. she reports the pain in the RUE has worsened but can fluctuates depending on activity. the hand is sitll numb across all fingers on the R hand but is progressing to the LUE intermittently    Pertinent History  Cubital tunnel syndrome    How long can you sit comfortably?  unlimited    How long can you stand comfortably?  unlimited    How long can you walk comfortably?  unlimited    Diagnostic tests  OA of spine w/ cervical radiculopathy (Rt C5-6 impingement on recent MRI)    Patient Stated Goals  to reduce pain/ numbness, and go back to normal    Currently in Pain?  Yes    Pain Score  3     Pain Location  Arm    Pain Orientation  Right    Pain Descriptors / Indicators   Tightness;Aching;Sore    Pain Type  Chronic pain    Pain Radiating Towards  into the R arm/ hand    Pain Onset  More than a month ago    Pain Frequency  Constant    Aggravating Factors   using the RUE with cooking, crafting    Pain Relieving Factors  Heat, E-stim    Effect of Pain on Daily Activities  limited endurance with RUE and pain         [facility] PT Assessment - 04/13/19 1432      Assessment   Medical Diagnosis   Lesion of ulnar nerve, right upper limb G56.21    Referring Provider (PT)  Roseanne Kaufman, MD     Onset Date/Surgical Date  --   June 2019   Hand Dominance  Right    Next MD Visit  --   3 months   Prior Therapy  OT for cubital tunnel, and shoulder/ neck with PT      Precautions   Precautions  None      Restrictions   Weight Bearing Restrictions  No      Balance Screen   Has  the patient fallen in the past 6 months  No      Home Environment   Living Environment  Private residence    Living Arrangements  Spouse/significant other    Available Help at Discharge  Family    Type of Home  House    Home Access  Ramped entrance    Home Layout  One level    Home Equipment  Wheelchair - manual      Prior Function   Level of Independence  Independent      Cognition   Overall Cognitive Status  Within Functional Limits for tasks assessed      Observation/Other Assessments   Focus on Therapeutic Outcomes (FOTO)   49% limited    predicted 38% limited     Posture/Postural Control   Posture/Postural Control  Postural limitations    Postural Limitations  Rounded Shoulders;Forward head      AROM   Overall AROM Comments  bil UE WFL    AROM Assessment Site  Shoulder    Right/Left Shoulder  Right    Cervical Flexion  40    Cervical Extension  32    Cervical - Right Side Bend  30   pain on ipsilatreal side   Cervical - Left Side Bend  28   pain on contralateral side   Cervical - Right Rotation  64    Cervical - Left Rotation  60      Strength   Overall  Strength Comments  RUE pain during testing in all planes    Strength Assessment Site  Shoulder;Elbow;Wrist;Hand    Right/Left Shoulder  Right;Left    Right Shoulder Flexion  4/5    Right Shoulder Extension  4/5    Right Shoulder ABduction  4/5    Right Shoulder Internal Rotation  4/5    Right Shoulder External Rotation  4/5    Left Shoulder Flexion  4/5    Left Shoulder Extension  4/5    Left Shoulder ABduction  4/5    Left Shoulder Internal Rotation  4/5    Left Shoulder External Rotation  4/5    Right/Left Elbow  Left    Right Elbow Flexion  4+/5    Right Elbow Extension  4+/5    Left Elbow Flexion  4/5    Left Elbow Extension  4/5    Right Wrist Flexion  4-/5    Right Wrist Extension  4-/5    Left Wrist Flexion  4+/5    Left Wrist Extension  4+/5    Right Hand Grip (lbs)  25.3   20, 33,23    Left Hand Grip (lbs)  35.3   27,39,40     Palpation   Palpation comment  TTP along R upper trap/ levator scapuale, along the anterior/ middle deltoid       Special Tests    Special Tests  Cervical    Cervical Tests  Spurling's;Dictraction;other      Spurling's   Findings  Positive    Side  Right      Distraction Test   Findngs  Positive    side  Right      other    Findings  Positive    Side  Right    Comment  ULTT of Median nerve RUE                Objective measurements completed on examination: See above findings.  PT Education - 04/13/19 1435    Education Details  evaluation findings, POC, goals, HEP with proper form/ rationale    Person(s) Educated  Patient    Methods  Explanation;Verbal cues;Handout    Comprehension  Verbalized understanding;Verbal cues required       PT Short Term Goals - 04/13/19 1536      PT SHORT TERM GOAL #1   Title  pt to be I with inital HEp    Time  3    Period  Weeks    Status  New    Target Date  05/04/19      PT SHORT TERM GOAL #2   Title  pt to verbalize/ demo proper posture and lifting  mechanics to reduce and prevent neck/ shoulder pain    Time  3    Period  Weeks    Status  New    Target Date  05/04/19        PT Long Term Goals - 04/13/19 1536      PT LONG TERM GOAL #1   Title  Pt will report decreased radiculopathy and pain overall in her arms and hands    Time  6    Period  Weeks    Status  New    Target Date  05/25/19      PT LONG TERM GOAL #2   Title  increase RUE strength to >/= 4+/5 to assist with lifting/ carrying activities as it relates to ADLS    Baseline  -    Period  Weeks    Status  New    Target Date  05/25/19      PT LONG TERM GOAL #3   Title  Pt will improve overall activity tolerance for funcitonal use of her arms and report improved sleeping quality.    Baseline  -    Time  6    Period  Weeks    Status  New    Target Date  05/25/19      PT LONG TERM GOAL #4   Title  increase FOTO score to </= 38% limited to demo improvement in function    Baseline  -    Period  Weeks    Status  New    Target Date  05/25/19      PT LONG TERM GOAL #5   Title  pt to be I with all HEP given as of last visit to maintain and progress current level of function    Time  6    Period  Weeks    Status  New    Target Date  05/25/19             Plan - 04/13/19 1531    Clinical Impression Statement  pt presents to OPPT after being away from PT for 4 months, secondary to insurance coverage. She continues to report pain inthe RUE with N/T into the hand as well as reporting the RUE feeling heavy. She has functional cervical and shoulder ROM, and mild weakness in the RUE compared bil. special testing cluster for is 3/4 for cerivcal radiculpathy suggesting highlikelihood. She would benefit from physical therapy to decreaes neck/ shoulder pain, reduce RUE referral, increase shoulder strength and maximizing her function by addressing the deficits listed.    Personal Factors and Comorbidities  Comorbidity 1    Comorbidities  hx of depression     Examination-Activity Limitations  Carry;Sleep;Lift;Caring for Others    Examination-Participation Restrictions  Meal Prep;Driving;Laundry  Stability/Clinical Decision Making  Evolving/Moderate complexity    Clinical Decision Making  Moderate    Rehab Potential  Good    PT Frequency  2x / week    PT Duration  6 weeks    PT Treatment/Interventions  ADLs/Self Care Home Management;Cryotherapy;Electrical Stimulation;Iontophoresis 4mg /ml Dexamethasone;Moist Heat;Traction;Ultrasound;Therapeutic activities;Therapeutic exercise;Neuromuscular re-education;Manual techniques;Passive range of motion;Dry needling;Spinal Manipulations;Joint Manipulations;Taping;Patient/family education    PT Next Visit Plan  review/ update HEP PRN, DN for upper trap/ levator scapulae, cervical traction, scapular stability / shoulder strengthening. try SNAGs    PT Home Exercise Plan  [id number] - upper trap stretch, levator scapulae stretching, shoulder self inferior mob, supine chin tuck with head lift, wrist flexor stretch    Consulted and Agree with Plan of Care  Patient       Patient will benefit from skilled therapeutic intervention in order to improve the following deficits and impairments:  Decreased activity tolerance, Decreased strength, Hypomobility, Increased fascial restricitons, Increased muscle spasms, Pain, Improper body mechanics, Postural dysfunction, Decreased endurance  Visit Diagnosis: Pain in right arm  Muscle weakness (generalized)  Cervicalgia     Problem List Patient Active Problem List   Diagnosis Date Noted  . Depression 11/16/2018  . Age-related cataract of right eye 11/16/2018  . Ulnar neuropathy at elbow, right 08/24/2018  . Neck pain 12/23/2017  . Vitamin D deficiency 03/04/2016  . Mixed hyperlipidemia 03/04/2016  . Muscle cramps 03/04/2016  . Paresthesias 03/04/2016  . Allergic rhinitis, cause unspecified 06/24/2013  . Hypothyroidism 06/24/2013   06/26/2013 PT, DPT, LAT,  ATC  04/13/19  3:40 PM      [facility] [address] [place], [place], [place] Phone: [phone]   Fax:  [phone]  Name: Stacey Gardner MRN: Jerrye Beavers Date of Birth: 24-Oct-1965

## 2019-04-26 DIAGNOSIS — H25031 Anterior subcapsular polar age-related cataract, right eye: Secondary | ICD-10-CM | POA: Diagnosis not present

## 2019-04-27 ENCOUNTER — Ambulatory Visit: Payer: BC Managed Care – PPO | Admitting: Physical Therapy

## 2019-04-27 ENCOUNTER — Other Ambulatory Visit: Payer: Self-pay

## 2019-04-27 ENCOUNTER — Encounter: Payer: Self-pay | Admitting: Physical Therapy

## 2019-04-27 DIAGNOSIS — M542 Cervicalgia: Secondary | ICD-10-CM

## 2019-04-27 DIAGNOSIS — M79601 Pain in right arm: Secondary | ICD-10-CM

## 2019-04-27 DIAGNOSIS — R209 Unspecified disturbances of skin sensation: Secondary | ICD-10-CM | POA: Diagnosis not present

## 2019-04-27 DIAGNOSIS — M6281 Muscle weakness (generalized): Secondary | ICD-10-CM | POA: Diagnosis not present

## 2019-04-27 NOTE — Therapy (Signed)
[facility] [address] [place], [place], [address] Phone: [phone]   Fax:  [phone]  Physical Therapy Treatment  Patient Details  Name: Stacey Gardner MRN: [id number] Date of Birth: 1965-07-23 Referring Provider (PT): Dominica Severin, MD    Encounter Date: 04/27/2019  PT End of Session - 04/27/19 0928    Visit Number  2    Number of Visits  13    Date for PT Re-Evaluation  05/04/19    Authorization Type  BCBS    PT Start Time  0848    PT Stop Time  0938    PT Time Calculation (min)  50 min    Activity Tolerance  Patient tolerated treatment well    Behavior During Therapy  [facility] for tasks assessed/performed       Past Medical History:  Diagnosis Date  . Allergy   . Anemia   . Depression   . Hypothyroid   . Mixed hyperlipidemia 03/04/2016  . Vitamin D deficiency     Past Surgical History:  Procedure Laterality Date  . CERVICAL POLYPECTOMY      There were no vitals filed for this visit.  Subjective Assessment - 04/27/19 0853    Subjective  "I felt increased soreness inthe neck after the exercises over the week, it did get better  after a few days"    Currently in Pain?  Yes    Pain Score  2     Pain Orientation  Right    Pain Descriptors / Indicators  Aching;Sore    Aggravating Factors   using the RUE                       [facility] Adult PT Treatment/Exercise - 04/27/19 0001      Neck Exercises: Supine   Neck Retraction  10 reps;5 secs   with chin tuck head lift     Shoulder Exercises: Seated   Other Seated Exercises  SNAGS 1 x 10 bil    mod cues for proper form     Manual Therapy   Manual therapy comments  MTPR along R upper trap/ SCM and scalenes    Joint Mobilization  Grade  III AP  mobilization to C3-C7, lateral gapping R C3-C7      Neck Exercises: Stretches   Upper Trapezius Stretch  1 rep;Right;30 seconds    Levator Stretch  1 rep;30 seconds   cues for proper form             PT Education - 04/27/19 0928    Education Details  updated HEP for SNAGS    Person(s) Educated  Patient    Methods  Explanation;Verbal cues;Handout    Comprehension  Verbalized understanding;Verbal cues required       PT Short Term Goals - 04/27/19 0930      PT SHORT TERM GOAL #1   Title  pt to be I with inital HEp    Period  Weeks    Status  On-going      PT SHORT TERM GOAL #2   Title  pt to verbalize/ demo proper posture and lifting mechanics to reduce and prevent neck/ shoulder pain    Period  Weeks    Status  On-going        PT Long Term Goals - 04/13/19 1536      PT LONG TERM GOAL #1   Title  Pt will report decreased radiculopathy and pain overall in her arms  and hands    Time  6    Period  Weeks    Status  New    Target Date  05/25/19      PT LONG TERM GOAL #2   Title  increase RUE strength to >/= 4+/5 to assist with lifting/ carrying activities as it relates to ADLS    Baseline  -    Period  Weeks    Status  New    Target Date  05/25/19      PT LONG TERM GOAL #3   Title  Pt will improve overall activity tolerance for funcitonal use of her arms and report improved sleeping quality.    Baseline  -    Time  6    Period  Weeks    Status  New    Target Date  05/25/19      PT LONG TERM GOAL #4   Title  increase FOTO score to </= 38% limited to demo improvement in function    Baseline  -    Period  Weeks    Status  New    Target Date  05/25/19      PT LONG TERM GOAL #5   Title  pt to be I with all HEP given as of last visit to maintain and progress current level of function    Time  6    Period  Weeks    Status  New    Target Date  05/25/19            Plan - 04/27/19 7867    Clinical Impression Statement  pt reported intial soreness in the neck likely from DOMS from isometric strengthening. She reported pain today at 2-3/10 and intermittent N/T in the 4th and 5th digits. continued focusing on cervical ROM and MTPR techniques which  she responded well to. updated HEP for cervical SNAGS which she did well with. ended session with cervical traction, which noted relief of tension in the neck.    PT Treatment/Interventions  ADLs/Self Care Home Management;Cryotherapy;Electrical Stimulation;Iontophoresis 4mg /ml Dexamethasone;Moist Heat;Traction;Ultrasound;Therapeutic activities;Therapeutic exercise;Neuromuscular re-education;Manual techniques;Passive range of motion;Dry needling;Spinal Manipulations;Joint Manipulations;Taping;Patient/family education    PT Next Visit Plan  review/ update HEP PRN, DN for upper trap/ levator scapulae, cervical traction, scapular stability / shoulder strengthening.    PT Home Exercise Plan  [id number] - upper trap stretch, levator scapulae stretching, shoulder self inferior mob, supine chin tuck with head lift, wrist flexor stretch, SNAGS    Consulted and Agree with Plan of Care  Patient       Patient will benefit from skilled therapeutic intervention in order to improve the following deficits and impairments:  Decreased activity tolerance, Decreased strength, Hypomobility, Increased fascial restricitons, Increased muscle spasms, Pain, Improper body mechanics, Postural dysfunction, Decreased endurance  Visit Diagnosis: Pain in right arm  Muscle weakness (generalized)  Cervicalgia     Problem List Patient Active Problem List   Diagnosis Date Noted  . Depression 11/16/2018  . Age-related cataract of right eye 11/16/2018  . Ulnar neuropathy at elbow, right 08/24/2018  . Neck pain 12/23/2017  . Vitamin D deficiency 03/04/2016  . Mixed hyperlipidemia 03/04/2016  . Muscle cramps 03/04/2016  . Paresthesias 03/04/2016  . Allergic rhinitis, cause unspecified 06/24/2013  . Hypothyroidism 06/24/2013   06/26/2013 PT, DPT, LAT, ATC  04/27/19  9:31 AM      [facility] [address] [place], [place], [place] Phone: [phone]   Fax:   [phone]  Name: Stacey Gardner MRN: [id number] Date of Birth: 28-Oct-1965

## 2019-04-29 ENCOUNTER — Encounter: Payer: BC Managed Care – PPO | Admitting: Physical Therapy

## 2019-04-30 DIAGNOSIS — S0501XA Injury of conjunctiva and corneal abrasion without foreign body, right eye, initial encounter: Secondary | ICD-10-CM | POA: Diagnosis not present

## 2019-05-03 ENCOUNTER — Ambulatory Visit: Payer: BC Managed Care – PPO | Admitting: Physical Therapy

## 2019-05-03 ENCOUNTER — Other Ambulatory Visit: Payer: Self-pay

## 2019-05-03 DIAGNOSIS — M79601 Pain in right arm: Secondary | ICD-10-CM

## 2019-05-03 DIAGNOSIS — M6281 Muscle weakness (generalized): Secondary | ICD-10-CM

## 2019-05-03 DIAGNOSIS — M542 Cervicalgia: Secondary | ICD-10-CM | POA: Diagnosis not present

## 2019-05-03 DIAGNOSIS — R209 Unspecified disturbances of skin sensation: Secondary | ICD-10-CM | POA: Diagnosis not present

## 2019-05-03 DIAGNOSIS — R208 Other disturbances of skin sensation: Secondary | ICD-10-CM

## 2019-05-03 NOTE — Therapy (Signed)
[facility] [place], [place], [address] Phone: [phone]   Fax:  [phone]  Physical Therapy Treatment  Patient Details  Name: Stacey Gardner MRN: [id number] Date of Birth: [birth year] Referring Provider (PT): Roseanne Kaufman, MD    Encounter Date: 05/03/2019  PT End of Session - 05/03/19 1419    Visit Number  3    Number of Visits  13    Date for PT Re-Evaluation  05/04/19    Authorization Type  BCBS    PT Start Time  1416    PT Stop Time  1505    PT Time Calculation (min)  49 min    Activity Tolerance  Patient tolerated treatment well    Behavior During Therapy  [facility] for tasks assessed/performed       Past Medical History:  Diagnosis Date  . Allergy   . Anemia   . Depression   . Hypothyroid   . Mixed hyperlipidemia 03/04/2016  . Vitamin D deficiency     Past Surgical History:  Procedure Laterality Date  . CERVICAL POLYPECTOMY      There were no vitals filed for this visit.  Subjective Assessment - 05/03/19 1422    Subjective  " I think I am doing pretty good. I did get some soreness in the l shoulder after the last session. The N/T intot he R hand is better, but still get some into the R hand"    Patient Stated Goals  to reduce pain/ numbness, and go back to normal    Currently in Pain?  Yes    Pain Score  1     Pain Orientation  Right    Pain Descriptors / Indicators  Tingling    Pain Type  Chronic pain         [facility] PT Assessment - 05/03/19 0001      Assessment   Medical Diagnosis   Lesion of ulnar nerve, right upper limb G56.21    Referring Provider (PT)  Roseanne Kaufman, MD       Strength   Right Hand Grip (lbs)  81                   [facility] Adult PT Treatment/Exercise - 05/03/19 0001      Therapeutic Activites    Therapeutic Activities  Other Therapeutic Activities    Other Therapeutic Activities  simulating drawing/ painting a veritcal canvas at shoulder height,  cued to perform standing closer to utilzing elbow flexion to reduce lever arm thus decreasing overactivation of the shoulder muscles and reduce compensation strategies   pt noted decreased pain/ stiffness     Neck Exercises: Seated   Other Seated Exercise  reviewed SNAGS with minor cues for hand positioning 2 x 10 to the L and R      Shoulder Exercises: Seated   Other Seated Exercises  scapular retraction with ER 2 x 10 wth red theraband      Traction   Min (lbs)  16    Max (lbs)  22    Hold Time  60    Rest Time  20    Time  10      Manual Therapy   Manual therapy comments  skilled palpation and monitoring of pt throughout TPDN    Joint Mobilization  Grade  III AP  mobilization to C3-C7, lateral gapping R C3-C7 grade III-IV      Neck Exercises: Stretches   Upper Trapezius Stretch  1 rep;Right;30 seconds       Trigger Point Dry Needling - 05/03/19 0001    Consent Given?  Yes    Education Handout Provided  Previously provided    Muscles Treated Head and Neck  Sternocleidomastoid    Sternocleidomastoid Response  Twitch response elicited;Palpable increased muscle length    Upper Trapezius Response  Twitch reponse elicited;Palpable increased muscle length   R only   Scalenes Response  Twitch reponse elicited;Palpable increased muscle length   r only          PT Education - 05/03/19 1506    Education Details  trial painting/ drawing at home pulling canvas closer so it can be performed with the elbow bent, draw for ~ 30 min before taking a break.    Person(s) Educated  Patient    Methods  Explanation;Handout;Verbal cues    Comprehension  Verbalized understanding;Verbal cues required       PT Short Term Goals - 04/27/19 0930      PT SHORT TERM GOAL #1   Title  pt to be I with inital HEp    Period  Weeks    Status  On-going      PT SHORT TERM GOAL #2   Title  pt to verbalize/ demo proper posture and lifting mechanics to reduce and prevent neck/ shoulder pain     Period  Weeks    Status  On-going        PT Long Term Goals - 04/13/19 1536      PT LONG TERM GOAL #1   Title  Pt will report decreased radiculopathy and pain overall in her arms and hands    Time  6    Period  Weeks    Status  New    Target Date  05/25/19      PT LONG TERM GOAL #2   Title  increase RUE strength to >/= 4+/5 to assist with lifting/ carrying activities as it relates to ADLS    Baseline  -    Period  Weeks    Status  New    Target Date  05/25/19      PT LONG TERM GOAL #3   Title  Pt will improve overall activity tolerance for funcitonal use of her arms and report improved sleeping quality.    Baseline  -    Time  6    Period  Weeks    Status  New    Target Date  05/25/19      PT LONG TERM GOAL #4   Title  increase FOTO score to </= 38% limited to demo improvement in function    Baseline  -    Period  Weeks    Status  New    Target Date  05/25/19      PT LONG TERM GOAL #5   Title  pt to be I with all HEP given as of last visit to maintain and progress current level of function    Time  6    Period  Weeks    Status  New    Target Date  05/25/19            Plan - 05/03/19 1500    Clinical Impression Statement  Mrs Goodpasture is making progress with PT with report of decreased N/T into the RUE and improved grip strength today at 55#. Performed TPDN focusin gon the R upper trap/ SCM and scalenes followed with cervical mobs. cotninued shoulder strengthening  and traction at the end of the session she continues to report improvement with limited to no N/T in the RUE.    PT Treatment/Interventions  ADLs/Self Care Home Management;Cryotherapy;Electrical Stimulation;Iontophoresis 4mg /ml Dexamethasone;Moist Heat;Traction;Ultrasound;Therapeutic activities;Therapeutic exercise;Neuromuscular re-education;Manual techniques;Passive range of motion;Dry needling;Spinal Manipulations;Joint Manipulations;Taping;Patient/family education    PT Next Visit Plan  review/  update HEP PRN, DN for upper trap/ levator scapulae, cervical traction, scapular stability / shoulder strengthening. posterior shoulder strengthening. update HEP, how was drawing    PT Home Exercise Plan  [id number] - upper trap stretch, levator scapulae stretching, shoulder self inferior mob, supine chin tuck with head lift, wrist flexor stretch, SNAGS    Consulted and Agree with Plan of Care  Patient       Patient will benefit from skilled therapeutic intervention in order to improve the following deficits and impairments:  Decreased activity tolerance, Decreased strength, Hypomobility, Increased fascial restricitons, Increased muscle spasms, Pain, Improper body mechanics, Postural dysfunction, Decreased endurance  Visit Diagnosis: Pain in right arm  Muscle weakness (generalized)  Cervicalgia  Other disturbances of skin sensation     Problem List Patient Active Problem List   Diagnosis Date Noted  . Depression 11/16/2018  . Age-related cataract of right eye 11/16/2018  . Ulnar neuropathy at elbow, right 08/24/2018  . Neck pain 12/23/2017  . Vitamin D deficiency 03/04/2016  . Mixed hyperlipidemia 03/04/2016  . Muscle cramps 03/04/2016  . Paresthesias 03/04/2016  . Allergic rhinitis, cause unspecified 06/24/2013  . Hypothyroidism 06/24/2013    06/26/2013 PT, DPT, LAT, ATC  05/03/19  3:10 PM      [facility] [address] [place], [place], [place] Phone: [phone]   Fax:  [phone]  Name: Stacey Gardner MRN: Jerrye Beavers Date of Birth: Apr 04, 1965

## 2019-05-05 ENCOUNTER — Other Ambulatory Visit: Payer: Self-pay

## 2019-05-05 ENCOUNTER — Ambulatory Visit: Payer: BC Managed Care – PPO | Admitting: Physical Therapy

## 2019-05-05 DIAGNOSIS — M6281 Muscle weakness (generalized): Secondary | ICD-10-CM

## 2019-05-05 DIAGNOSIS — M79601 Pain in right arm: Secondary | ICD-10-CM

## 2019-05-05 DIAGNOSIS — R208 Other disturbances of skin sensation: Secondary | ICD-10-CM

## 2019-05-05 DIAGNOSIS — M542 Cervicalgia: Secondary | ICD-10-CM | POA: Diagnosis not present

## 2019-05-05 DIAGNOSIS — R209 Unspecified disturbances of skin sensation: Secondary | ICD-10-CM | POA: Diagnosis not present

## 2019-05-05 NOTE — Therapy (Signed)
[facility] [address] [place], [place], [address] Phone: [phone]   Fax:  [phone]  Physical Therapy Treatment / Re-certification  Patient Details  Name: Stacey Gardner MRN: [id number] Date of Birth: 19-Nov-1965 Referring Provider (PT): Dominica Severin, MD    Encounter Date: 05/05/2019  PT End of Session - 05/05/19 1417    Visit Number  4    Number of Visits  13    Date for PT Re-Evaluation  06/02/19    Authorization Type  BCBS    PT Start Time  1418    PT Stop Time  1508    PT Time Calculation (min)  50 min    Activity Tolerance  Patient tolerated treatment well    Behavior During Therapy  [facility] for tasks assessed/performed       Past Medical History:  Diagnosis Date  . Allergy   . Anemia   . Depression   . Hypothyroid   . Mixed hyperlipidemia 03/04/2016  . Vitamin D deficiency     Past Surgical History:  Procedure Laterality Date  . CERVICAL POLYPECTOMY      There were no vitals filed for this visit.  Subjective Assessment - 05/05/19 1418    Subjective  "I started drawing and it wasn't so bad, I do feel like I am haivng some stiffness the back of the R hand, which I had to hold the pencil differently.    Currently in Pain?  Yes    Pain Score  2    in the R upper trap   Pain Location  Arm    Pain Orientation  Right    Pain Type  Chronic pain    Pain Onset  More than a month ago    Pain Frequency  Intermittent         [facility] PT Assessment - 05/05/19 0001      Assessment   Medical Diagnosis   Lesion of ulnar nerve, right upper limb G56.21    Referring Provider (PT)  Dominica Severin, MD       Observation/Other Assessments   Focus on Therapeutic Outcomes (FOTO)   59% limited      Strength   Right Shoulder Flexion  4+/5    Right Shoulder Extension  4+/5    Right Shoulder ABduction  4+/5    Right Shoulder Internal Rotation  4+/5    Right Shoulder External Rotation  4+/5                    [facility] Adult PT Treatment/Exercise - 05/05/19 0001      Therapeutic Activites    Other Therapeutic Activities  mimicking drawring with efficent hand/ gripping positiong using narrow and wide grip (pen vs hi-liter) with elbow flexed    pt would initially power grip pencil and draw     Shoulder Exercises: Seated   Other Seated Exercises  scapular retraction with ER 2 x 15 wth red theraband    Other Seated Exercises  bicep curl with red theraband 2 x 15 with red theraband      Traction   Min (lbs)  16    Max (lbs)  22    Hold Time  60    Rest Time  20    Time  10             PT Education - 05/05/19 1459    Education Details  reviewed gripping and drawing to promote efficient elbow/ wrist/ hand mechanics.  dicussed POC moving forward with focus on strengthening/ endurance.    Person(s) Educated  Patient    Methods  Explanation;Verbal cues;Demonstration;Tactile cues    Comprehension  Verbalized understanding;Verbal cues required;Returned demonstration       PT Short Term Goals - 05/05/19 1429      PT SHORT TERM GOAL #1   Title  pt to be I with inital HEp    Period  Weeks    Status  Achieved      PT SHORT TERM GOAL #2   Title  pt to verbalize/ demo proper posture and lifting mechanics to reduce and prevent neck/ shoulder pain    Status  Achieved        PT Long Term Goals - 05/05/19 1436      PT LONG TERM GOAL #1   Title  Pt will report decreased radiculopathy and pain overall in her arms and hands    Baseline  reporting its getting better    Period  Weeks    Status  On-going      PT LONG TERM GOAL #2   Title  increase RUE strength to >/= 4+/5 to assist with lifting/ carrying activities as it relates to ADLS    Period  Weeks      PT LONG TERM GOAL #3   Title  Pt will improve overall activity tolerance for funcitonal use of her arms and report improved sleeping quality.    Baseline  reporting she is sleeping better    Period  Weeks     Status  Achieved      PT LONG TERM GOAL #4   Title  increase FOTO score to </= 38% limited to demo improvement in function    Period  Weeks    Status  On-going      PT LONG TERM GOAL #5   Title  pt to be I with all HEP given as of last visit to maintain and progress current level of function    Period  Weeks    Status  On-going            Plan - 05/05/19 1500    Clinical Impression Statement  pt contineus to report improvement in pain in the R shoulder and N/T in the R hand, and she is progressing appropriatley with her goals today meeting all STG's and progressing toward LTG's. reviewed gripping mechanics as it relates to painting/ drawing to reduce stress on the elbow/ shoulder which she required intermittent cues on proper form. continued shoulder strengthening with increased reps to promote endurance. she would benefit from continued physical therapy to decrease R shoulder pain, reduce altered sensation in R hand, maximixe efficient posture/ lifting mecahnics and maximize her function and work toward independent exercise.    PT Frequency  2x / week    PT Duration  4 weeks    PT Treatment/Interventions  ADLs/Self Care Home Management;Cryotherapy;Electrical Stimulation;Iontophoresis 4mg /ml Dexamethasone;Moist Heat;Traction;Ultrasound;Therapeutic activities;Therapeutic exercise;Neuromuscular re-education;Manual techniques;Passive range of motion;Dry needling;Spinal Manipulations;Joint Manipulations;Taping;Patient/family education    PT Next Visit Plan  review/ update HEP PRN, DN for upper trap/ levator scapulae, cervical traction, scapular stability / shoulder strengthening. posterior shoulder strengthening. update HEP, how was drawing    PT Home Exercise Plan  [id number] - upper trap stretch, levator scapulae stretching, shoulder self inferior mob, supine chin tuck with head lift, wrist flexor stretch, SNAGS    Consulted and Agree with Plan of Care  Patient       Patient will benefit  from skilled therapeutic intervention in order to improve the following deficits and impairments:  Decreased activity tolerance, Decreased strength, Hypomobility, Increased fascial restricitons, Increased muscle spasms, Pain, Improper body mechanics, Postural dysfunction, Decreased endurance  Visit Diagnosis: Pain in right arm  Muscle weakness (generalized)  Cervicalgia  Other disturbances of skin sensation     Problem List Patient Active Problem List   Diagnosis Date Noted  . Depression 11/16/2018  . Age-related cataract of right eye 11/16/2018  . Ulnar neuropathy at elbow, right 08/24/2018  . Neck pain 12/23/2017  . Vitamin D deficiency 03/04/2016  . Mixed hyperlipidemia 03/04/2016  . Muscle cramps 03/04/2016  . Paresthesias 03/04/2016  . Allergic rhinitis, cause unspecified 06/24/2013  . Hypothyroidism 06/24/2013   Starr Lake PT, DPT, LAT, ATC  05/05/19  3:03 PM      [facility] [address] [place], [place], [address] Phone: [phone]   Fax:  [phone]  Name: Stacey Gardner MRN: [id number] Date of Birth: 04-Aug-1965

## 2019-05-10 ENCOUNTER — Ambulatory Visit: Payer: BC Managed Care – PPO | Attending: Orthopedic Surgery | Admitting: Physical Therapy

## 2019-05-10 ENCOUNTER — Other Ambulatory Visit: Payer: Self-pay

## 2019-05-10 ENCOUNTER — Encounter: Payer: Self-pay | Admitting: Physical Therapy

## 2019-05-10 DIAGNOSIS — M79602 Pain in left arm: Secondary | ICD-10-CM | POA: Insufficient documentation

## 2019-05-10 DIAGNOSIS — M542 Cervicalgia: Secondary | ICD-10-CM

## 2019-05-10 DIAGNOSIS — M79601 Pain in right arm: Secondary | ICD-10-CM | POA: Diagnosis not present

## 2019-05-10 DIAGNOSIS — M6281 Muscle weakness (generalized): Secondary | ICD-10-CM | POA: Diagnosis not present

## 2019-05-10 DIAGNOSIS — R209 Unspecified disturbances of skin sensation: Secondary | ICD-10-CM | POA: Diagnosis not present

## 2019-05-10 DIAGNOSIS — R208 Other disturbances of skin sensation: Secondary | ICD-10-CM

## 2019-05-10 NOTE — Therapy (Signed)
[facility] [place], [place], [address] Phone: [phone]   Fax:  [phone]  Physical Therapy Treatment  Patient Details  Name: Stacey Gardner MRN: [id number] Date of Birth: 1966-01-22 Referring Provider (PT): Roseanne Kaufman, MD    Encounter Date: 05/10/2019  PT End of Session - 05/10/19 1419    Visit Number  5    Number of Visits  13    Date for PT Re-Evaluation  06/02/19    Authorization Type  BCBS    PT Start Time  1418    PT Stop Time  1509    PT Time Calculation (min)  51 min    Activity Tolerance  Patient tolerated treatment well    Behavior During Therapy  [facility] for tasks assessed/performed       Past Medical History:  Diagnosis Date  . Allergy   . Anemia   . Depression   . Hypothyroid   . Mixed hyperlipidemia 03/04/2016  . Vitamin D deficiency     Past Surgical History:  Procedure Laterality Date  . CERVICAL POLYPECTOMY      There were no vitals filed for this visit.  Subjective Assessment - 05/10/19 1426    Subjective  "I am feeling more sore today, especially after the last session and I am just nervious that I caused pain or issues"    Patient Stated Goals  to reduce pain/ numbness, and go back to normal    Currently in Pain?  Yes    Pain Score  4     Pain Orientation  Right    Pain Descriptors / Indicators  Aching;Sore    Pain Type  Chronic pain    Pain Onset  More than a month ago    Pain Frequency  Intermittent    Aggravating Factors   using the RUE         [facility] PT Assessment - 05/10/19 0001      Assessment   Medical Diagnosis   Lesion of ulnar nerve, right upper limb G56.21    Referring Provider (PT)  Roseanne Kaufman, MD                    [facility] Adult PT Treatment/Exercise - 05/10/19 1430      Neck Exercises: Machines for Strengthening   Nustep  L5 x 5 min UE/LE      Traction   Min (lbs)  16    Max (lbs)  22    Hold Time  60    Rest Time  20    Time  10       Manual Therapy   Joint Mobilization  Cervical gapping technique Grade ll on the rt.     Manual Traction   min gentle cervical traction    Other Manual Therapy  Trigger point release in upper traps and scalenes,              PT Education - 05/10/19 1506    Education Details  Patient was educated on soreness after working muscle groups. She was also educated on centralization and nerve entrapment    Person(s) Educated  Patient    Methods  Explanation    Comprehension  Verbalized understanding       PT Short Term Goals - 05/05/19 1429      PT SHORT TERM GOAL #1   Title  pt to be I with inital HEp    Period  Weeks    Status  Achieved      PT SHORT TERM GOAL #2   Title  pt to verbalize/ demo proper posture and lifting mechanics to reduce and prevent neck/ shoulder pain    Status  Achieved        PT Long Term Goals - 05/05/19 1436      PT LONG TERM GOAL #1   Title  Pt will report decreased radiculopathy and pain overall in her arms and hands    Baseline  reporting its getting better    Period  Weeks    Status  On-going      PT LONG TERM GOAL #2   Title  increase RUE strength to >/= 4+/5 to assist with lifting/ carrying activities as it relates to ADLS    Period  Weeks      PT LONG TERM GOAL #3   Title  Pt will improve overall activity tolerance for funcitonal use of her arms and report improved sleeping quality.    Baseline  reporting she is sleeping better    Period  Weeks    Status  Achieved      PT LONG TERM GOAL #4   Title  increase FOTO score to </= 38% limited to demo improvement in function    Period  Weeks    Status  On-going      PT LONG TERM GOAL #5   Title  pt to be I with all HEP given as of last visit to maintain and progress current level of function    Period  Weeks    Status  On-going            Plan - 05/10/19 1510    Clinical Impression Statement  Patient reports soreness in the right shoulder region. She expressed a  great amount of concern regarding the pain that she was experiencing in her arm, but was educated by the therapist about delayed muscle soreness.  She continues to report N/T, but she felt better after working on the NuStep. She had a great amount of relief with manual therapy and responded favorably to traction. The patient would benefit from PT in order to further decrease pain and N/T in the UE and shoulder region.    PT Treatment/Interventions  ADLs/Self Care Home Management;Cryotherapy;Electrical Stimulation;Iontophoresis 4mg /ml Dexamethasone;Moist Heat;Traction;Ultrasound;Therapeutic activities;Therapeutic exercise;Neuromuscular re-education;Manual techniques;Passive range of motion;Dry needling;Spinal Manipulations;Joint Manipulations;Taping;Patient/family education    Consulted and Agree with Plan of Care  Patient       Patient will benefit from skilled therapeutic intervention in order to improve the following deficits and impairments:  Decreased activity tolerance, Decreased strength, Hypomobility, Increased fascial restricitons, Increased muscle spasms, Pain, Improper body mechanics, Postural dysfunction, Decreased endurance  Visit Diagnosis: Pain in right arm  Muscle weakness (generalized)  Cervicalgia  Other disturbances of skin sensation     Problem List Patient Active Problem List   Diagnosis Date Noted  . Depression 11/16/2018  . Age-related cataract of right eye 11/16/2018  . Ulnar neuropathy at elbow, right 08/24/2018  . Neck pain 12/23/2017  . Vitamin D deficiency 03/04/2016  . Mixed hyperlipidemia 03/04/2016  . Muscle cramps 03/04/2016  . Paresthesias 03/04/2016  . Allergic rhinitis, cause unspecified 06/24/2013  . Hypothyroidism 06/24/2013    06/26/2013, SPT 05/10/2019, 3:23 PM  [facility] [address] [place], [place], [place] Phone: [phone]   Fax:  [phone]  Name: Deshonda Cryderman MRN:  Jerrye Beavers Date of Birth: [birth year]

## 2019-05-12 ENCOUNTER — Other Ambulatory Visit: Payer: Self-pay

## 2019-05-12 ENCOUNTER — Encounter: Payer: Self-pay | Admitting: Physical Therapy

## 2019-05-12 ENCOUNTER — Ambulatory Visit: Payer: BC Managed Care – PPO | Admitting: Physical Therapy

## 2019-05-12 ENCOUNTER — Encounter: Payer: Self-pay | Admitting: Family Medicine

## 2019-05-12 DIAGNOSIS — R208 Other disturbances of skin sensation: Secondary | ICD-10-CM

## 2019-05-12 DIAGNOSIS — M542 Cervicalgia: Secondary | ICD-10-CM

## 2019-05-12 DIAGNOSIS — M6281 Muscle weakness (generalized): Secondary | ICD-10-CM

## 2019-05-12 DIAGNOSIS — M79602 Pain in left arm: Secondary | ICD-10-CM | POA: Diagnosis not present

## 2019-05-12 DIAGNOSIS — R209 Unspecified disturbances of skin sensation: Secondary | ICD-10-CM | POA: Diagnosis not present

## 2019-05-12 DIAGNOSIS — M79601 Pain in right arm: Secondary | ICD-10-CM | POA: Diagnosis not present

## 2019-05-12 NOTE — Therapy (Addendum)
[facility] [address] [place], [place], [address] Phone: [phone]   Fax:  [phone]  Physical Therapy Treatment  Patient Details  Name: Stacey Gardner MRN: [id number] Date of Birth: 08-17-1965 Referring Provider (PT): Dominica Severin, MD    Encounter Date: 05/12/2019  PT End of Session - 05/12/19 1421     Visit Number  6    Number of Visits  13    Date for PT Re-Evaluation  06/02/19    Authorization Type  BCBS    PT Start Time  1421    PT Stop Time  1509    PT Time Calculation (min)  48 min    Activity Tolerance  Patient tolerated treatment well    Behavior During Therapy  [facility] for tasks assessed/performed        Past Medical History:  Diagnosis Date   Allergy    Anemia    Depression    Hypothyroid    Mixed hyperlipidemia 03/04/2016   Vitamin D deficiency     Past Surgical History:  Procedure Laterality Date   CERVICAL POLYPECTOMY      There were no vitals filed for this visit.  Subjective Assessment - 05/12/19 1423     Subjective  " I felt a lot better after the session, but i still have a little pain in the back of my shoulder. I have been doing all of my exercises. I have to do a lot of household chores that aggravate my symptoms."    Pertinent History  Cubital tunnel syndrome    Diagnostic tests  OA of spine w/ cervical radiculopathy (Rt C5-6 impingement on recent MRI)    Patient Stated Goals  to reduce pain/ numbness, and go back to normal    Currently in Pain?  Yes    Pain Score  3     Pain Location  Arm    Pain Orientation  Right    Pain Descriptors / Indicators  Aching;Sore    Pain Type  Chronic pain    Pain Radiating Towards  into the R arm/hand    Pain Onset  More than a month ago    Pain Frequency  Intermittent                                       [facility] Adult PT Treatment/Exercise - 05/12/19 0001       Neck Exercises: Machines for Strengthening   Nustep  L5 x 5  min UE/LE      Neck Exercises: Seated   Neck Retraction  1 rep;20 reps      Neck Exercises: Supine   Neck Retraction  3 secs;15 reps;1 rep      Traction   Min (lbs)  16    Max (lbs)  22    Hold Time  60    Rest Time  20    Time  10      Manual Therapy   Manual Traction   min gentle cervical traction    Other Manual Therapy  Trigger point release in upper traps strain/counterstrain, suboccipital release    Neural Stretch  median nerve  and ulnar nerve glides on RUE      Neck Exercises: Stretches   Upper Trapezius Stretch  Right;Left;3 reps;30 seconds    Levator Stretch  Right;Left;2 reps;30 seconds  PT Education - 05/12/19 1501     Education Details  Patient educated on appropriate exercise and rest periods to prevent overactivation and stressing    Person(s) Educated  Patient    Methods  Explanation    Comprehension  Verbalized understanding        PT Short Term Goals - 05/05/19 1429       PT SHORT TERM GOAL #1   Title  pt to be I with inital HEp    Period  Weeks    Status  Achieved      PT SHORT TERM GOAL #2   Title  pt to verbalize/ demo proper posture and lifting mechanics to reduce and prevent neck/ shoulder pain    Status  Achieved          PT Long Term Goals - 05/05/19 1436       PT LONG TERM GOAL #1   Title  Pt will report decreased radiculopathy and pain overall in her arms and hands    Baseline  reporting its getting better    Period  Weeks    Status  On-going      PT LONG TERM GOAL #2   Title  increase RUE strength to >/= 4+/5 to assist with lifting/ carrying activities as it relates to ADLS    Period  Weeks      PT LONG TERM GOAL #3   Title  Pt will improve overall activity tolerance for funcitonal use of her arms and report improved sleeping quality.    Baseline  reporting she is sleeping better    Period  Weeks    Status  Achieved      PT LONG TERM GOAL #4   Title  increase FOTO score to </= 38% limited to  demo improvement in function    Period  Weeks    Status  On-going      PT LONG TERM GOAL #5   Title  pt to be I with all HEP given as of last visit to maintain and progress current level of function    Period  Weeks    Status  On-going                  Plan - 05/12/19 1502     Clinical Impression Statement  Patient reports that she feels a lot better since her last session. She was educated on appropriate exercise and rest periods throughout the day in order to calm down her pain and N/T throughout the day. She responded favorably to manual therapy and traction. She would benefit from PT in order to further address pain and N/T.    Personal Factors and Comorbidities  Comorbidity 1    Comorbidities  hx of depression    Examination-Activity Limitations  Carry;Sleep;Lift;Caring for Others    Examination-Participation Restrictions  Meal Prep;Driving;Laundry    Stability/Clinical Decision Making  Evolving/Moderate complexity    Clinical Decision Making  Moderate    Rehab Potential  Good    PT Frequency  2x / week    PT Treatment/Interventions  ADLs/Self Care Home Management;Cryotherapy;Electrical Stimulation;Iontophoresis 4mg /ml Dexamethasone;Moist Heat;Traction;Ultrasound;Therapeutic activities;Therapeutic exercise;Neuromuscular re-education;Manual techniques;Passive range of motion;Dry needling;Spinal Manipulations;Joint Manipulations;Taping;Patient/family education    PT Next Visit Plan  review/ update HEP PRN, DN for upper trap/ levator scapulae, cervical traction, scapular stability / shoulder strengthening. posterior shoulder strengthening. update HEP, how was drawing    PT Home Exercise Plan  [id number] - upper trap stretch, levator scapulae stretching, shoulder self inferior  mob, supine chin tuck with head lift, wrist flexor stretch, SNAGS    Consulted and Agree with Plan of Care  Patient        Patient will benefit from skilled therapeutic intervention in order to improve the  following deficits and impairments:  Decreased activity tolerance, Decreased strength, Hypomobility, Increased fascial restricitons, Increased muscle spasms, Pain, Improper body mechanics, Postural dysfunction, Decreased endurance  Visit Diagnosis: Pain in right arm  Muscle weakness (generalized)  Cervicalgia  Other disturbances of skin sensation  Pain in left arm      Problem List Patient Active Problem List   Diagnosis Date Noted   Depression 11/16/2018   Age-related cataract of right eye 11/16/2018   Ulnar neuropathy at elbow, right 08/24/2018   Neck pain 12/23/2017   Vitamin D deficiency 03/04/2016   Mixed hyperlipidemia 03/04/2016   Muscle cramps 03/04/2016   Paresthesias 03/04/2016   Allergic rhinitis, cause unspecified 06/24/2013   Hypothyroidism 06/24/2013    Laveda Norman, SPT 05/12/2019, 5:35 PM  [facility] [address] [place], [place], [address] Phone: [phone]   Fax:  [phone]  Name: Skylen Danielsen MRN: [id number] Date of Birth: 1965/08/02

## 2019-05-17 ENCOUNTER — Ambulatory Visit: Payer: BC Managed Care – PPO | Admitting: Physical Therapy

## 2019-05-17 ENCOUNTER — Other Ambulatory Visit: Payer: Self-pay

## 2019-05-17 DIAGNOSIS — M6281 Muscle weakness (generalized): Secondary | ICD-10-CM | POA: Diagnosis not present

## 2019-05-17 DIAGNOSIS — M542 Cervicalgia: Secondary | ICD-10-CM | POA: Diagnosis not present

## 2019-05-17 DIAGNOSIS — M79601 Pain in right arm: Secondary | ICD-10-CM

## 2019-05-17 DIAGNOSIS — M79602 Pain in left arm: Secondary | ICD-10-CM | POA: Diagnosis not present

## 2019-05-17 DIAGNOSIS — R209 Unspecified disturbances of skin sensation: Secondary | ICD-10-CM | POA: Diagnosis not present

## 2019-05-17 NOTE — Therapy (Signed)
[place], [place], [address] Phone: [phone]   Fax:  [phone]  Physical Therapy Treatment  Patient Details  Name: Stacey Gardner MRN: [id number] Date of Birth: May 18, 1965 Referring Provider (PT): Roseanne Kaufman, MD    Encounter Date: 05/17/2019  PT End of Session - 05/17/19 1418    Visit Number  7    Number of Visits  13    Date for PT Re-Evaluation  06/02/19    Authorization Type  BCBS    PT Start Time  1418    PT Stop Time  1508    PT Time Calculation (min)  50 min    Activity Tolerance  Patient tolerated treatment well    Behavior During Therapy  [facility] for tasks assessed/performed       Past Medical History:  Diagnosis Date  . Allergy   . Anemia   . Depression   . Hypothyroid   . Mixed hyperlipidemia 03/04/2016  . Vitamin D deficiency     Past Surgical History:  Procedure Laterality Date  . CERVICAL POLYPECTOMY      There were no vitals filed for this visit.  Subjective Assessment - 05/17/19 1418    Subjective  "I've been doing all the stuff that has been given to me. I was putting a desk together for my husband and using the screwdriver I felt sore and clumsy and had increased shaking in my arm. I did all my exercise and it helped my shoulder"         [facility] PT Assessment - 05/17/19 0001      Assessment   Medical Diagnosis   Lesion of ulnar nerve, right upper limb G56.21    Referring Provider (PT)  Roseanne Kaufman, MD                    [facility] Adult PT Treatment/Exercise - 05/17/19 0001      Elbow Exercises   Other elbow exercises  gripping putting 1 x 10 squeezing 5 seconds ea.   combined through sustainted chin tuck     Shoulder Exercises: Seated   Row  Strengthening;10 reps;Theraband    Theraband Level (Shoulder Row)  Level 2 (Red)   combined with chin tuck     Traction   Type of Traction  Cervical   table lowered to increase gapping of facet joints    Min (lbs)  18    Max (lbs)  23    Hold Time  60    Rest Time  20    Time  10      Manual Therapy   Manual Therapy  Passive ROM    Manual therapy comments  skilled palpation and monitoring of pt throughout TPDN    Joint Mobilization  AP C4-C7 grade III    Soft tissue mobilization  IASTM R cervical paraspinals    Passive ROM  cervical rotation 2 working into end range        Trigger Point Dry Needling - 05/17/19 0001    Consent Given?  Yes    Education Handout Provided  Previously provided    Cervical multifidi Response  Twitch reponse elicited   D3-U2 on the R          PT Education - 05/17/19 1503    Education Details  updated HEP for shoulder strengthening and gripping while maintaing cervical chin tuck    Person(s) Educated  Patient    Methods  Explanation;Verbal cues;Handout  Comprehension  Verbalized understanding;Verbal cues required       PT Short Term Goals - 05/05/19 1429      PT SHORT TERM GOAL #1   Title  pt to be I with inital HEp    Period  Weeks    Status  Achieved      PT SHORT TERM GOAL #2   Title  pt to verbalize/ demo proper posture and lifting mechanics to reduce and prevent neck/ shoulder pain    Status  Achieved        PT Long Term Goals - 05/05/19 1436      PT LONG TERM GOAL #1   Title  Pt will report decreased radiculopathy and pain overall in her arms and hands    Baseline  reporting its getting better    Period  Weeks    Status  On-going      PT LONG TERM GOAL #2   Title  increase RUE strength to >/= 4+/5 to assist with lifting/ carrying activities as it relates to ADLS    Period  Weeks      PT LONG TERM GOAL #3   Title  Pt will improve overall activity tolerance for funcitonal use of her arms and report improved sleeping quality.    Baseline  reporting she is sleeping better    Period  Weeks    Status  Achieved      PT LONG TERM GOAL #4   Title  increase FOTO score to </= 38% limited to demo improvement in function     Period  Weeks    Status  On-going      PT LONG TERM GOAL #5   Title  pt to be I with all HEP given as of last visit to maintain and progress current level of function    Period  Weeks    Status  On-going            Plan - 05/17/19 1459    Clinical Impression Statement  pt reports she had done more at home and noticed increased numbness in the RUE that reduced with exercises and stretching. TPDN was perfomred on cervical multifidi on the R followed with AP cervical mobs. updated HEP for rows and gripping putty, she repsonded well to exercise while maintaining chin tucked position with reduced RUE referral. continued traction at end of session which she continues to respond well to.    PT Treatment/Interventions  ADLs/Self Care Home Management;Cryotherapy;Electrical Stimulation;Iontophoresis 4mg /ml Dexamethasone;Moist Heat;Traction;Ultrasound;Therapeutic activities;Therapeutic exercise;Neuromuscular re-education;Manual techniques;Passive range of motion;Dry needling;Spinal Manipulations;Joint Manipulations;Taping;Patient/family education    PT Next Visit Plan  review/ update HEP PRN, DN for upper trap/ levator scapulae, cervical traction, scapular stability / shoulder strengthening. posterior shoulder strengthening. update HEP, how was drawing    PT Home Exercise Plan  [id number] - upper trap stretch, levator scapulae stretching, shoulder self inferior mob, supine chin tuck with head lift, wrist flexor stretch, SNAGS, putty gripping, rows    Consulted and Agree with Plan of Care  Patient       Patient will benefit from skilled therapeutic intervention in order to improve the following deficits and impairments:  Decreased activity tolerance, Decreased strength, Hypomobility, Increased fascial restricitons, Increased muscle spasms, Pain, Improper body mechanics, Postural dysfunction, Decreased endurance  Visit Diagnosis: Pain in right arm  Muscle weakness  (generalized)  Cervicalgia     Problem List Patient Active Problem List   Diagnosis Date Noted  . Depression 11/16/2018  . Age-related cataract of  right eye 11/16/2018  . Ulnar neuropathy at elbow, right 08/24/2018  . Neck pain 12/23/2017  . Vitamin D deficiency 03/04/2016  . Mixed hyperlipidemia 03/04/2016  . Muscle cramps 03/04/2016  . Paresthesias 03/04/2016  . Allergic rhinitis, cause unspecified 06/24/2013  . Hypothyroidism 06/24/2013   Lulu Riding PT, DPT, LAT, ATC  05/17/19  3:04 PM      [facility] [address] [place], [place], [address] Phone: [phone]   Fax:  [phone]  Name: Jaasia Viglione MRN: [id number] Date of Birth: February 07, 1965

## 2019-05-19 ENCOUNTER — Encounter: Payer: BC Managed Care – PPO | Admitting: Physical Therapy

## 2019-05-20 ENCOUNTER — Ambulatory Visit: Payer: BC Managed Care – PPO | Admitting: Physical Therapy

## 2019-05-20 ENCOUNTER — Other Ambulatory Visit: Payer: Self-pay

## 2019-05-20 ENCOUNTER — Encounter: Payer: Self-pay | Admitting: Physical Therapy

## 2019-05-20 DIAGNOSIS — M79601 Pain in right arm: Secondary | ICD-10-CM

## 2019-05-20 DIAGNOSIS — M542 Cervicalgia: Secondary | ICD-10-CM

## 2019-05-20 DIAGNOSIS — R209 Unspecified disturbances of skin sensation: Secondary | ICD-10-CM | POA: Diagnosis not present

## 2019-05-20 DIAGNOSIS — M79602 Pain in left arm: Secondary | ICD-10-CM | POA: Diagnosis not present

## 2019-05-20 DIAGNOSIS — M6281 Muscle weakness (generalized): Secondary | ICD-10-CM | POA: Diagnosis not present

## 2019-05-20 NOTE — Therapy (Signed)
[facility] [address] [place], [place], [address] Phone: [phone]   Fax:  [phone]  Physical Therapy Treatment  Patient Details  Name: Stacey Gardner MRN: [id number] Date of Birth: [birth year] Referring Provider (PT): Dominica Severin, MD    Encounter Date: 05/20/2019  PT End of Session - 05/20/19 1420    Visit Number  8    Number of Visits  13    Date for PT Re-Evaluation  06/02/19    Authorization Type  BCBS    PT Start Time  1418    PT Stop Time  1502    PT Time Calculation (min)  44 min    Activity Tolerance  Patient tolerated treatment well    Behavior During Therapy  [facility] for tasks assessed/performed       Past Medical History:  Diagnosis Date  . Allergy   . Anemia   . Depression   . Hypothyroid   . Mixed hyperlipidemia 03/04/2016  . Vitamin D deficiency     Past Surgical History:  Procedure Laterality Date  . CERVICAL POLYPECTOMY      There were no vitals filed for this visit.  Subjective Assessment - 05/20/19 1420    Subjective  "Yesterday was my husbands birthday, so I did alot of cooking and I was sore. I did the exercises which in the moment it didn't seem to help but I am feeling much better today"    Currently in Pain?  Yes    Pain Score  3     Pain Location  Arm    Pain Orientation  Right    Pain Descriptors / Indicators  Aching;Sore    Pain Type  Chronic pain    Pain Onset  More than a month ago    Pain Frequency  Intermittent    Aggravating Factors   using the RUE    Pain Relieving Factors  heat, e-stim         [facility] PT Assessment - 05/20/19 0001      Assessment   Medical Diagnosis   Lesion of ulnar nerve, right upper limb G56.21    Referring Provider (PT)  Dominica Severin, MD                    [facility] Adult PT Treatment/Exercise - 05/20/19 0001      Neck Exercises: Machines for Strengthening   Nustep  L5 x 5 min UE/LE      Neck Exercises: Standing   Wall  Push Ups  10 reps   tactile cues and demonstration for proper form, x 2 sets     Shoulder Exercises: Standing   Flexion  Strengthening;Both;10 reps   1# bil x 2 sets   Extension  Strengthening;12 reps;Theraband   x 2 sets   Theraband Level (Shoulder Extension)  Level 1 (Yellow)    Row  Strengthening;Both;12 reps;Theraband   x 2 sets   Theraband Level (Shoulder Row)  Level 1 (Yellow)      Traction   Type of Traction  Cervical    Min (lbs)  18    Max (lbs)  23    Hold Time  60    Rest Time  20    Time  10               PT Short Term Goals - 05/05/19 1429      PT SHORT TERM GOAL #1   Title  pt to be I with inital  HEp    Period  Weeks    Status  Achieved      PT SHORT TERM GOAL #2   Title  pt to verbalize/ demo proper posture and lifting mechanics to reduce and prevent neck/ shoulder pain    Status  Achieved        PT Long Term Goals - 05/05/19 1436      PT LONG TERM GOAL #1   Title  Pt will report decreased radiculopathy and pain overall in her arms and hands    Baseline  reporting its getting better    Period  Weeks    Status  On-going      PT LONG TERM GOAL #2   Title  increase RUE strength to >/= 4+/5 to assist with lifting/ carrying activities as it relates to ADLS    Period  Weeks      PT LONG TERM GOAL #3   Title  Pt will improve overall activity tolerance for funcitonal use of her arms and report improved sleeping quality.    Baseline  reporting she is sleeping better    Period  Weeks    Status  Achieved      PT LONG TERM GOAL #4   Title  increase FOTO score to </= 38% limited to demo improvement in function    Period  Weeks    Status  On-going      PT LONG TERM GOAL #5   Title  pt to be I with all HEP given as of last visit to maintain and progress current level of function    Period  Weeks    Status  On-going            Plan - 05/20/19 1428    Clinical Impression Statement  pt reports she has been doing pretty good but still gets  intermittent soreness in the shoulder/ wrist/ hand. She continues to make good progress and opted to focuse session on more poserior chain strengthening and cervical stabilization which she did well with while maintaining chin tuck. continued traction at end of session which she continues to report improvement.    PT Treatment/Interventions  ADLs/Self Care Home Management;Cryotherapy;Electrical Stimulation;Iontophoresis 4mg /ml Dexamethasone;Moist Heat;Traction;Ultrasound;Therapeutic activities;Therapeutic exercise;Neuromuscular re-education;Manual techniques;Passive range of motion;Dry needling;Spinal Manipulations;Joint Manipulations;Taping;Patient/family education    PT Next Visit Plan  review/ update HEP PRN, DN PRN., cervical traction, scapular stability / shoulder strengthening. posterior shoulder strengthening. update HEP, continues to progress shoulder strengthening    PT Home Exercise Plan  [id number] - upper trap stretch, levator scapulae stretching, shoulder self inferior mob, supine chin tuck with head lift, wrist flexor stretch, SNAGS, putty gripping, rows, scaption angle flexion    Consulted and Agree with Plan of Care  Patient       Patient will benefit from skilled therapeutic intervention in order to improve the following deficits and impairments:  Decreased activity tolerance, Decreased strength, Hypomobility, Increased fascial restricitons, Increased muscle spasms, Pain, Improper body mechanics, Postural dysfunction, Decreased endurance  Visit Diagnosis: Pain in right arm  Muscle weakness (generalized)  Cervicalgia     Problem List Patient Active Problem List   Diagnosis Date Noted  . Depression 11/16/2018  . Age-related cataract of right eye 11/16/2018  . Ulnar neuropathy at elbow, right 08/24/2018  . Neck pain 12/23/2017  . Vitamin D deficiency 03/04/2016  . Mixed hyperlipidemia 03/04/2016  . Muscle cramps 03/04/2016  . Paresthesias 03/04/2016  . Allergic rhinitis,  cause unspecified 06/24/2013  . Hypothyroidism 06/24/2013  Lulu Riding PT, DPT, LAT, ATC  05/20/19  2:56 PM      [facility] [address] [place], [place], [address] Phone: [phone]   Fax:  [phone]  Name: Stacey Gardner MRN: [id number] Date of Birth: [birth year]

## 2019-05-26 ENCOUNTER — Other Ambulatory Visit: Payer: Self-pay | Admitting: Family Medicine

## 2019-05-26 DIAGNOSIS — E039 Hypothyroidism, unspecified: Secondary | ICD-10-CM

## 2019-05-28 ENCOUNTER — Other Ambulatory Visit: Payer: Self-pay

## 2019-05-28 ENCOUNTER — Encounter: Payer: Self-pay | Admitting: Physical Therapy

## 2019-05-28 ENCOUNTER — Ambulatory Visit: Payer: BC Managed Care – PPO | Admitting: Physical Therapy

## 2019-05-28 DIAGNOSIS — M542 Cervicalgia: Secondary | ICD-10-CM

## 2019-05-28 DIAGNOSIS — M79601 Pain in right arm: Secondary | ICD-10-CM | POA: Diagnosis not present

## 2019-05-28 DIAGNOSIS — M6281 Muscle weakness (generalized): Secondary | ICD-10-CM

## 2019-05-28 DIAGNOSIS — R209 Unspecified disturbances of skin sensation: Secondary | ICD-10-CM | POA: Diagnosis not present

## 2019-05-28 DIAGNOSIS — M79602 Pain in left arm: Secondary | ICD-10-CM | POA: Diagnosis not present

## 2019-05-28 NOTE — Therapy (Signed)
[place], [place], [address] Phone: [phone]   Fax:  [phone]  Physical Therapy Treatment  Patient Details  Name: Stacey Gardner MRN: [id number] Date of Birth: [birth year] Referring Provider (PT): Roseanne Kaufman, MD    Encounter Date: 05/28/2019  PT End of Session - 05/28/19 1116    Visit Number  9    Number of Visits  13    Date for PT Re-Evaluation  06/02/19    Authorization Type  BCBS    PT Start Time  0930    PT Stop Time  1020    PT Time Calculation (min)  50 min    Activity Tolerance  Patient tolerated treatment well    Behavior During Therapy  [facility] for tasks assessed/performed       Past Medical History:  Diagnosis Date  . Allergy   . Anemia   . Depression   . Hypothyroid   . Mixed hyperlipidemia 03/04/2016  . Vitamin D deficiency     Past Surgical History:  Procedure Laterality Date  . CERVICAL POLYPECTOMY      There were no vitals filed for this visit.  Subjective Assessment - 05/28/19 0854    Subjective  Patients husband was in the hospital over the weekend. She was not able to do her stretches and exercises and feels like she is tighter and having more pain in her upper trap. She is not having any pain down her arm but her hand is still numb.    Pertinent History  Cubital tunnel syndrome    How long can you sit comfortably?  unlimited    How long can you stand comfortably?  unlimited    How long can you walk comfortably?  unlimited    Diagnostic tests  OA of spine w/ cervical radiculopathy (Rt C5-6 impingement on recent MRI)    Patient Stated Goals  to reduce pain/ numbness, and go back to normal    Currently in Pain?  Yes    Pain Score  3     Pain Location  Arm    Pain Orientation  Right    Pain Descriptors / Indicators  Aching    Pain Type  Chronic pain    Pain Radiating Towards  into the right arm and hand    Pain Onset  More than a month ago    Aggravating  Factors   using her right UE    Pain Relieving Factors  heat, rest    Effect of Pain on Daily Activities  limited use of right UE/ difficulty drawing                       [facility] Adult PT Treatment/Exercise - 05/28/19 0001      Shoulder Exercises: Standing   Flexion  Strengthening;Both;10 reps   1# bil x 2 sets   Extension  Strengthening;Theraband;10 reps   x 2 sets   Theraband Level (Shoulder Extension)  Level 2 (Red)    Row  Strengthening;Both;12 reps;Theraband    Theraband Level (Shoulder Row)  Level 2 (Red)      Traction   Min (lbs)  18    Max (lbs)  23    Hold Time  60    Rest Time  20    Time  10      Manual Therapy   Soft tissue mobilization  IASTM R cervical paraspinals; supine STM to cervivcal paraspinals     Manual  Traction   gentle cervical traction      Neck Exercises: Stretches   Upper Trapezius Stretch  Right;Left;3 reps;30 seconds    Levator Stretch  Right;Left;2 reps;30 seconds             PT Education - 05/28/19 1116    Education Details  reviewed purpose of ther-ex for use of her left arm    Person(s) Educated  Patient    Methods  Explanation;Demonstration    Comprehension  Verbalized understanding       PT Short Term Goals - 05/05/19 1429      PT SHORT TERM GOAL #1   Title  pt to be I with inital HEp    Period  Weeks    Status  Achieved      PT SHORT TERM GOAL #2   Title  pt to verbalize/ demo proper posture and lifting mechanics to reduce and prevent neck/ shoulder pain    Status  Achieved        PT Long Term Goals - 05/28/19 1123      PT LONG TERM GOAL #1   Title  Pt will report decreased radiculopathy and pain overall in her arms and hands    Baseline  reporting its getting better    Time  6    Period  Weeks    Status  On-going      PT LONG TERM GOAL #2   Title  increase RUE strength to >/= 4+/5 to assist with lifting/ carrying activities as it relates to ADLS    Period  Weeks    Status  New      PT LONG  TERM GOAL #3   Title  Pt will improve overall activity tolerance for funcitonal use of her arms and report improved sleeping quality.    Baseline  reporting she is sleeping better    Time  6    Period  Weeks    Status  Achieved      PT LONG TERM GOAL #4   Title  increase FOTO score to </= 38% limited to demo improvement in function    Time  6    Period  Weeks    Status  On-going      PT LONG TERM GOAL #5   Title  pt to be I with all HEP given as of last visit to maintain and progress current level of function    Time  6    Period  Weeks    Status  On-going            Plan - 05/28/19 1117    Clinical Impression Statement  Patient had spasming of her right upper trap today. She reported improved symptoms after treatment. She has been drawing lately but can only perform 10-15 minuytes before she has to put her pencil down. herapy worked on scpa stabilization and flexion strength to help increase the time she can draw. Therapy will continue to progress ther-ex as tolerated.    Personal Factors and Comorbidities  Comorbidity 1    Comorbidities  hx of depression    Examination-Activity Limitations  Carry;Sleep;Lift;Caring for Others    Examination-Participation Restrictions  Meal Prep;Driving;Laundry    Stability/Clinical Decision Making  Evolving/Moderate complexity    Clinical Decision Making  Moderate    Rehab Potential  Good    PT Treatment/Interventions  ADLs/Self Care Home Management;Cryotherapy;Electrical Stimulation;Iontophoresis 4mg /ml Dexamethasone;Moist Heat;Traction;Ultrasound;Therapeutic activities;Therapeutic exercise;Neuromuscular re-education;Manual techniques;Passive range of motion;Dry needling;Spinal Manipulations;Joint Manipulations;Taping;Patient/family education  PT Next Visit Plan  review/ update HEP PRN, DN PRN., cervical traction, scapular stability / shoulder strengthening. posterior shoulder strengthening. update HEP, continues to progress shoulder  strengthening    PT Home Exercise Plan  [id number] - upper trap stretch, levator scapulae stretching, shoulder self inferior mob, supine chin tuck with head lift, wrist flexor stretch, SNAGS, putty gripping, rows, scaption angle flexion    Consulted and Agree with Plan of Care  Patient       Patient will benefit from skilled therapeutic intervention in order to improve the following deficits and impairments:  Decreased activity tolerance, Decreased strength, Hypomobility, Increased fascial restricitons, Increased muscle spasms, Pain, Improper body mechanics, Postural dysfunction, Decreased endurance  Visit Diagnosis: Pain in right arm  Muscle weakness (generalized)  Cervicalgia     Problem List Patient Active Problem List   Diagnosis Date Noted  . Depression 11/16/2018  . Age-related cataract of right eye 11/16/2018  . Ulnar neuropathy at elbow, right 08/24/2018  . Neck pain 12/23/2017  . Vitamin D deficiency 03/04/2016  . Mixed hyperlipidemia 03/04/2016  . Muscle cramps 03/04/2016  . Paresthesias 03/04/2016  . Allergic rhinitis, cause unspecified 06/24/2013  . Hypothyroidism 06/24/2013    Dessie Coma PT DPT  05/28/2019, 11:25 AM  [facility] [address] [place], [place], [address] Phone: [phone]   Fax:  [phone]  Name: Brunilda Eble MRN: [id number] Date of Birth: November 23, 1965

## 2019-05-31 ENCOUNTER — Other Ambulatory Visit: Payer: Self-pay

## 2019-05-31 ENCOUNTER — Ambulatory Visit: Payer: BC Managed Care – PPO | Admitting: Physical Therapy

## 2019-05-31 ENCOUNTER — Encounter: Payer: Self-pay | Admitting: Physical Therapy

## 2019-05-31 DIAGNOSIS — M6281 Muscle weakness (generalized): Secondary | ICD-10-CM | POA: Diagnosis not present

## 2019-05-31 DIAGNOSIS — M542 Cervicalgia: Secondary | ICD-10-CM | POA: Diagnosis not present

## 2019-05-31 DIAGNOSIS — R208 Other disturbances of skin sensation: Secondary | ICD-10-CM

## 2019-05-31 DIAGNOSIS — R209 Unspecified disturbances of skin sensation: Secondary | ICD-10-CM | POA: Diagnosis not present

## 2019-05-31 DIAGNOSIS — M79602 Pain in left arm: Secondary | ICD-10-CM | POA: Diagnosis not present

## 2019-05-31 DIAGNOSIS — M79601 Pain in right arm: Secondary | ICD-10-CM

## 2019-05-31 NOTE — Therapy (Signed)
[place], [place], [address] Phone: [phone]   Fax:  [phone]  Physical Therapy Treatment  Patient Details  Name: Stacey Gardner MRN: [id number] Date of Birth: 1965-12-23 Referring Provider (PT): Roseanne Kaufman, MD    Encounter Date: 05/31/2019  PT End of Session - 05/31/19 0933    Visit Number  10    Number of Visits  13    Date for PT Re-Evaluation  06/02/19    Authorization Type  BCBS    PT Start Time  0931    PT Stop Time  1015    PT Time Calculation (min)  44 min    Activity Tolerance  Patient tolerated treatment well    Behavior During Therapy  [facility] for tasks assessed/performed       Past Medical History:  Diagnosis Date  . Allergy   . Anemia   . Depression   . Hypothyroid   . Mixed hyperlipidemia 03/04/2016  . Vitamin D deficiency     Past Surgical History:  Procedure Laterality Date  . CERVICAL POLYPECTOMY      There were no vitals filed for this visit.  Subjective Assessment - 05/31/19 0935    Subjective  " I did some of the the exercises before I felt pain and I think it really helped"         [facility] PT Assessment - 05/31/19 0001      Assessment   Medical Diagnosis   Lesion of ulnar nerve, right upper limb G56.21    Referring Provider (PT)  Roseanne Kaufman, MD                    [facility] Adult PT Treatment/Exercise - 05/31/19 0001      Shoulder Exercises: Standing   Flexion  Strengthening;Both;10 reps   x 2 sets in scaption angle   Shoulder Flexion Weight (lbs)  1    Extension  Strengthening;Theraband;10 reps    Theraband Level (Shoulder Extension)  Level 2 (Red)    Row  Strengthening;Both;12 reps;Theraband    Theraband Level (Shoulder Row)  Level 2 (Red)      Shoulder Exercises: ROM/Strengthening   UBE (Upper Arm Bike)  L1 x 4 min    fwd/bwd x 2 min      Traction   Type of Traction  Cervical    Min (lbs)  19    Max (lbs)  24    Hold Time   60    Rest Time  20    Time  15      Manual Therapy   Soft tissue mobilization  IASTM R cervical paraspinals; supine STM to cervivcal paraspinals       Neck Exercises: Stretches   Upper Trapezius Stretch  2 reps;30 seconds;Right    Levator Stretch  2 reps;30 seconds;Right               PT Short Term Goals - 05/05/19 1429      PT SHORT TERM GOAL #1   Title  pt to be I with inital HEp    Period  Weeks    Status  Achieved      PT SHORT TERM GOAL #2   Title  pt to verbalize/ demo proper posture and lifting mechanics to reduce and prevent neck/ shoulder pain    Status  Achieved        PT Long Term Goals - 05/28/19 1123      PT LONG  TERM GOAL #1   Title  Pt will report decreased radiculopathy and pain overall in her arms and hands    Baseline  reporting its getting better    Time  6    Period  Weeks    Status  On-going      PT LONG TERM GOAL #2   Title  increase RUE strength to >/= 4+/5 to assist with lifting/ carrying activities as it relates to ADLS    Period  Weeks    Status  New      PT LONG TERM GOAL #3   Title  Pt will improve overall activity tolerance for funcitonal use of her arms and report improved sleeping quality.    Baseline  reporting she is sleeping better    Time  6    Period  Weeks    Status  Achieved      PT LONG TERM GOAL #4   Title  increase FOTO score to </= 38% limited to demo improvement in function    Time  6    Period  Weeks    Status  On-going      PT LONG TERM GOAL #5   Title  pt to be I with all HEP given as of last visit to maintain and progress current level of function    Time  6    Period  Weeks    Status  On-going            Plan - 05/31/19 1011    Clinical Impression Statement  pt rpeorts she has been more consistent with her stretching first before she starts to feel pain or stiffness. Trialed cervical traction first today and followed with shoulder strengthenig which she did very well with. end of session she  reported  no pain or issues. plan to reassess next visit.    PT Treatment/Interventions  ADLs/Self Care Home Management;Cryotherapy;Electrical Stimulation;Iontophoresis 4mg /ml Dexamethasone;Moist Heat;Traction;Ultrasound;Therapeutic activities;Therapeutic exercise;Neuromuscular re-education;Manual techniques;Passive range of motion;Dry needling;Spinal Manipulations;Joint Manipulations;Taping;Patient/family education    PT Next Visit Plan  review/ update HEP PRN, DN PRN., cervical traction, scapular stability / shoulder strengthening. posterior shoulder strengthening. update HEP, continues to progress shoulder strengthening    PT Home Exercise Plan  [id number] - upper trap stretch, levator scapulae stretching, shoulder self inferior mob, supine chin tuck with head lift, wrist flexor stretch, SNAGS, putty gripping, rows, scaption angle flexion    Consulted and Agree with Plan of Care  Patient       Patient will benefit from skilled therapeutic intervention in order to improve the following deficits and impairments:  Decreased activity tolerance, Decreased strength, Hypomobility, Increased fascial restricitons, Increased muscle spasms, Pain, Improper body mechanics, Postural dysfunction, Decreased endurance  Visit Diagnosis: Pain in right arm  Muscle weakness (generalized)  Cervicalgia  Other disturbances of skin sensation     Problem List Patient Active Problem List   Diagnosis Date Noted  . Depression 11/16/2018  . Age-related cataract of right eye 11/16/2018  . Ulnar neuropathy at elbow, right 08/24/2018  . Neck pain 12/23/2017  . Vitamin D deficiency 03/04/2016  . Mixed hyperlipidemia 03/04/2016  . Muscle cramps 03/04/2016  . Paresthesias 03/04/2016  . Allergic rhinitis, cause unspecified 06/24/2013  . Hypothyroidism 06/24/2013   06/26/2013 PT, DPT, LAT, ATC  05/31/19  10:16 AM      [facility] [address] [place], [place], [place] Phone: [phone]   Fax:  [phone]  Name: Stacey Gardner MRN: Jerrye Beavers  Date of Birth: Sep 14, 1965

## 2019-06-02 ENCOUNTER — Encounter: Payer: BC Managed Care – PPO | Admitting: Physical Therapy

## 2019-06-07 ENCOUNTER — Other Ambulatory Visit: Payer: Self-pay

## 2019-06-07 ENCOUNTER — Ambulatory Visit: Payer: BC Managed Care – PPO | Attending: Orthopedic Surgery | Admitting: Physical Therapy

## 2019-06-07 ENCOUNTER — Encounter: Payer: Self-pay | Admitting: Physical Therapy

## 2019-06-07 DIAGNOSIS — M6281 Muscle weakness (generalized): Secondary | ICD-10-CM | POA: Insufficient documentation

## 2019-06-07 DIAGNOSIS — M79601 Pain in right arm: Secondary | ICD-10-CM | POA: Diagnosis not present

## 2019-06-07 DIAGNOSIS — R209 Unspecified disturbances of skin sensation: Secondary | ICD-10-CM | POA: Insufficient documentation

## 2019-06-07 DIAGNOSIS — R208 Other disturbances of skin sensation: Secondary | ICD-10-CM

## 2019-06-07 DIAGNOSIS — M542 Cervicalgia: Secondary | ICD-10-CM | POA: Diagnosis not present

## 2019-06-07 NOTE — Therapy (Signed)
[facility] Out[facility] [address] [place], [place], [address] Phone: [phone]   Fax:  [phone]  Physical Therapy Treatment / Re-certification  Patient Details  Name: Stacey Gardner MRN: [id number] Date of Birth: [birth year] Referring Provider (PT): Dominica Severin, MD    Encounter Date: 06/07/2019  PT End of Session - 06/07/19 0933    Visit Number  11    Number of Visits  17    Date for PT Re-Evaluation  08/02/19    Authorization Type  BCBS    PT Start Time  0932    PT Stop Time  1013    PT Time Calculation (min)  41 min    Activity Tolerance  Patient tolerated treatment well    Behavior During Therapy  [facility] for tasks assessed/performed       Past Medical History:  Diagnosis Date  . Allergy   . Anemia   . Depression   . Hypothyroid   . Mixed hyperlipidemia 03/04/2016  . Vitamin D deficiency     Past Surgical History:  Procedure Laterality Date  . CERVICAL POLYPECTOMY      There were no vitals filed for this visit.  Subjective Assessment - 06/07/19 0935    Subjective  " I woke up alittle sore at 4-5/10. I did start to do the exercises and stretch and it seems to help. I think the pillow is still bothering me."    Patient Stated Goals  to reduce pain/ numbness, and go back to normal    Currently in Pain?  Yes    Pain Score  3     Pain Location  Neck    Pain Orientation  Right    Pain Descriptors / Indicators  Tightness;Aching    Pain Type  Chronic pain    Pain Onset  More than a month ago    Pain Frequency  Intermittent    Aggravating Factors   getting out of bed, prolonged activity with RUE    Pain Relieving Factors  heat, rest         [facility] PT Assessment - 06/07/19 0001      Assessment   Medical Diagnosis   Lesion of ulnar nerve, right upper limb G56.21    Referring Provider (PT)  Dominica Severin, MD       Observation/Other Assessments   Focus on Therapeutic Outcomes (FOTO)   39% limited      Strength   Right Shoulder Flexion  4+/5    Right Shoulder Extension  4+/5    Right Shoulder ABduction  4+/5    Right Shoulder Internal Rotation  4+/5    Right Shoulder External Rotation  4+/5                   [facility] Adult PT Treatment/Exercise - 06/07/19 0001      Neck Exercises: Standing   Wall Push Ups  10 reps      Neck Exercises: Seated   Neck Retraction  10 reps;5 secs      Traction   Type of Traction  Cervical    Min (lbs)  19    Max (lbs)  24    Hold Time  60    Rest Time  20    Time  10             PT Education - 06/07/19 0956    Education Details  reviewed HEP and discussed POC dropping to 1 x a week for 4 weeks.  Person(s) Educated  Patient    Methods  Explanation;Verbal cues    Comprehension  Verbalized understanding;Verbal cues required       PT Short Term Goals - 05/05/19 1429      PT SHORT TERM GOAL #1   Title  pt to be I with inital HEp    Period  Weeks    Status  Achieved      PT SHORT TERM GOAL #2   Title  pt to verbalize/ demo proper posture and lifting mechanics to reduce and prevent neck/ shoulder pain    Status  Achieved        PT Long Term Goals - 06/07/19 0942      PT LONG TERM GOAL #1   Title  Pt will report decreased radiculopathy and pain overall in her arms and hands    Baseline  reporting its getting better    Period  Weeks    Status  On-going      PT LONG TERM GOAL #2   Title  increase RUE strength to >/= 4+/5 to assist with lifting/ carrying activities as it relates to ADLS    Period  Weeks    Status  Achieved      PT LONG TERM GOAL #3   Title  Pt will improve overall activity tolerance for funcitonal use of her arms and report improved sleeping quality.    Period  Weeks    Status  On-going      PT LONG TERM GOAL #4   Title  increase FOTO score to </= 38% limited to demo improvement in function    Period  Weeks      PT LONG TERM GOAL #5   Title  pt to be I with all HEP given as of last visit to  maintain and progress current level of function    Baseline  continues to progress as able.    Period  Weeks    Status  On-going            Plan - 06/07/19 1004    Clinical Impression Statement  [name] continues to demonstrate improvement with physical therapy reporting decrease RUE N/T and is returning to doing more activities at home. She does continue to having intermittent RUE tightness and concordant N/T. She continues to respond well to traction and worked on functional pushing of a W/C since her husband uses a W/C. plan to continue with current POC dropping to 1 x a week for 4 weeks to promote shoulder strength, reduce N/T and maximize overall function and work toward independent exercise.    PT Frequency  1x / week    PT Duration  4 weeks    PT Treatment/Interventions  ADLs/Self Care Home Management;Cryotherapy;Electrical Stimulation;Iontophoresis 4mg /ml Dexamethasone;Moist Heat;Traction;Ultrasound;Therapeutic activities;Therapeutic exercise;Neuromuscular re-education;Manual techniques;Passive range of motion;Dry needling;Spinal Manipulations;Joint Manipulations;Taping;Patient/family education    PT Next Visit Plan  review/ update HEP PRN, DN PRN., cervical traction, scapular stability / shoulder strengthening. posterior shoulder strengthening. update HEP, continues to progress shoulder strengthening, practice pushing wheelchair form    PT Home Exercise Plan  [id number] - upper trap stretch, levator scapulae stretching, shoulder self inferior mob, supine chin tuck with head lift, wrist flexor stretch, SNAGS, putty gripping, rows, scaption angle flexion, serratus wall push-up    Consulted and Agree with Plan of Care  Patient       Patient will benefit from skilled therapeutic intervention in order to improve the following deficits and impairments:  Decreased activity tolerance, Decreased  strength, Hypomobility, Increased fascial restricitons, Increased muscle spasms, Pain, Improper  body mechanics, Postural dysfunction, Decreased endurance  Visit Diagnosis: Pain in right arm  Muscle weakness (generalized)  Cervicalgia  Other disturbances of skin sensation     Problem List Patient Active Problem List   Diagnosis Date Noted  . Depression 11/16/2018  . Age-related cataract of right eye 11/16/2018  . Ulnar neuropathy at elbow, right 08/24/2018  . Neck pain 12/23/2017  . Vitamin D deficiency 03/04/2016  . Mixed hyperlipidemia 03/04/2016  . Muscle cramps 03/04/2016  . Paresthesias 03/04/2016  . Allergic rhinitis, cause unspecified 06/24/2013  . Hypothyroidism 06/24/2013    Starr Lake PT, DPT, LAT, ATC  06/07/19  10:14 AM      [facility] [address] [place], [place], [address] Phone: [phone]   Fax:  [phone]  Name: Stacey Gardner MRN: [id number] Date of Birth: Mar 04, 1965

## 2019-06-09 ENCOUNTER — Ambulatory Visit: Payer: BC Managed Care – PPO | Admitting: Physical Therapy

## 2019-06-09 ENCOUNTER — Other Ambulatory Visit: Payer: Self-pay

## 2019-06-09 ENCOUNTER — Encounter: Payer: Self-pay | Admitting: Physical Therapy

## 2019-06-09 DIAGNOSIS — M542 Cervicalgia: Secondary | ICD-10-CM

## 2019-06-09 DIAGNOSIS — M79601 Pain in right arm: Secondary | ICD-10-CM

## 2019-06-09 DIAGNOSIS — R209 Unspecified disturbances of skin sensation: Secondary | ICD-10-CM | POA: Diagnosis not present

## 2019-06-09 DIAGNOSIS — M6281 Muscle weakness (generalized): Secondary | ICD-10-CM | POA: Diagnosis not present

## 2019-06-09 NOTE — Therapy (Signed)
[facility] Outpatient Rehabilitation [facility] [address] [place], [place], [address] Phone: [phone]   Fax:  [phone]  Physical Therapy Treatment  Patient Details  Name: Stacey Gardner MRN: [id number] Date of Birth: [birth year] Referring Provider (PT): Dominica Severin, MD    Encounter Date: 06/09/2019  PT End of Session - 06/09/19 1324    Visit Number  12    Number of Visits  17    Date for PT Re-Evaluation  08/02/19    Authorization Type  BCBS    PT Start Time  0930    PT Stop Time  1025    PT Time Calculation (min)  55 min    Activity Tolerance  Patient tolerated treatment well    Behavior During Therapy  [facility] for tasks assessed/performed       Past Medical History:  Diagnosis Date  . Allergy   . Anemia   . Depression   . Hypothyroid   . Mixed hyperlipidemia 03/04/2016  . Vitamin D deficiency     Past Surgical History:  Procedure Laterality Date  . CERVICAL POLYPECTOMY      There were no vitals filed for this visit.  Subjective Assessment - 06/09/19 1322    Subjective  Patient reports just mild pain today in her right upper trap. She brought her HEP and would like to review.    Pertinent History  Cubital tunnel syndrome    How long can you sit comfortably?  unlimited    How long can you walk comfortably?  unlimited    Diagnostic tests  OA of spine w/ cervical radiculopathy (Rt C5-6 impingement on recent MRI)    Patient Stated Goals  to reduce pain/ numbness, and go back to normal    Currently in Pain?  Yes    Pain Score  3     Pain Location  Neck    Pain Orientation  Right    Pain Descriptors / Indicators  Aching    Pain Type  Chronic pain    Pain Onset  More than a month ago    Pain Frequency  Intermittent    Aggravating Factors   pushing her husbands wheelchair    Pain Relieving Factors  heat and rest    Effect of Pain on Daily Activities  limited use of the right UE                       [facility] Adult PT  Treatment/Exercise - 06/09/19 0001      Self-Care   Other Self-Care Comments   reviewed HEP and when to do which exercises. Reviewed technique with putty, mulligan self snag; and scap retraction; reviewed tehcnique pushing wheelvchair. Patient advised to be as upright as able but when she fatigues she leans over to get morepower from her legs. This may be what she needs to do.       Neck Exercises: Standing   Other Standing Exercises  Scap retraction green 2x10 with cuing for technique; shoulder extension given for HEP red 2x10    Other Standing Exercises  Standing Y's attmepted with 2lb weight       Traction   Type of Traction  Cervical    Min (lbs)  19    Max (lbs)  24    Hold Time  60    Rest Time  20    Time  10      Manual Therapy   Manual Traction   min gentle cervical  traction    Other Manual Therapy  Trigger point release in upper traps strain/counterstrain, suboccipital release             PT Education - 06/09/19 1323    Person(s) Educated  Patient    Methods  Explanation;Demonstration;Tactile cues;Verbal cues    Comprehension  Verbalized understanding;Returned demonstration;Verbal cues required;Tactile cues required       PT Short Term Goals - 05/05/19 1429      PT SHORT TERM GOAL #1   Title  pt to be I with inital HEp    Period  Weeks    Status  Achieved      PT SHORT TERM GOAL #2   Title  pt to verbalize/ demo proper posture and lifting mechanics to reduce and prevent neck/ shoulder pain    Status  Achieved        PT Long Term Goals - 06/07/19 0942      PT LONG TERM GOAL #1   Title  Pt will report decreased radiculopathy and pain overall in her arms and hands    Baseline  reporting its getting better    Period  Weeks    Status  On-going      PT LONG TERM GOAL #2   Title  increase RUE strength to >/= 4+/5 to assist with lifting/ carrying activities as it relates to ADLS    Period  Weeks    Status  Achieved      PT LONG TERM GOAL #3   Title   Pt will improve overall activity tolerance for funcitonal use of her arms and report improved sleeping quality.    Period  Weeks    Status  On-going      PT LONG TERM GOAL #4   Title  increase FOTO score to </= 38% limited to demo improvement in function    Period  Weeks      PT LONG TERM GOAL #5   Title  pt to be I with all HEP given as of last visit to maintain and progress current level of function    Baseline  continues to progress as able.    Period  Weeks    Status  On-going            Plan - 06/09/19 1325    Clinical Impression Statement  Patient wanted to review her HEP today. Therapy went through each exercise with her and discussed technique and how to use the exericses. She felt more comofrtable with mulligan mobilizations after treatment. Therapy also reviewed use of the wheelchair with her husband. When she fatigues she gets lower and pusheds more. While it might not be ideal for her neck itr may be how she has to do it. Therapy added 21 exercises that she can do at the same time as her other exercises.    Personal Factors and Comorbidities  Comorbidity 1    Comorbidities  hx of depression    Examination-Activity Limitations  Carry;Sleep;Lift;Caring for Others    Examination-Participation Restrictions  Meal Prep;Driving;Laundry    Stability/Clinical Decision Making  Evolving/Moderate complexity    Clinical Decision Making  Moderate    Rehab Potential  Good    PT Frequency  1x / week    PT Duration  4 weeks    PT Treatment/Interventions  ADLs/Self Care Home Management;Cryotherapy;Electrical Stimulation;Iontophoresis 4mg /ml Dexamethasone;Moist Heat;Traction;Ultrasound;Therapeutic activities;Therapeutic exercise;Neuromuscular re-education;Manual techniques;Passive range of motion;Dry needling;Spinal Manipulations;Joint Manipulations;Taping;Patient/family education    PT Next Visit Plan  review/ update HEP PRN,  DN PRN., cervical traction, scapular stability / shoulder  strengthening. posterior shoulder strengthening. update HEP, continues to progress shoulder strengthening, practice pushing wheelchair form    PT Home Exercise Plan  [id number] - upper trap stretch, levator scapulae stretching, shoulder self inferior mob, supine chin tuck with head lift, wrist flexor stretch, SNAGS, putty gripping, rows, scaption angle flexion, serratus wall push-up    Consulted and Agree with Plan of Care  Patient       Patient will benefit from skilled therapeutic intervention in order to improve the following deficits and impairments:  Decreased activity tolerance, Decreased strength, Hypomobility, Increased fascial restricitons, Increased muscle spasms, Pain, Improper body mechanics, Postural dysfunction, Decreased endurance  Visit Diagnosis: Pain in right arm  Muscle weakness (generalized)  Cervicalgia     Problem List Patient Active Problem List   Diagnosis Date Noted  . Depression 11/16/2018  . Age-related cataract of right eye 11/16/2018  . Ulnar neuropathy at elbow, right 08/24/2018  . Neck pain 12/23/2017  . Vitamin D deficiency 03/04/2016  . Mixed hyperlipidemia 03/04/2016  . Muscle cramps 03/04/2016  . Paresthesias 03/04/2016  . Allergic rhinitis, cause unspecified 06/24/2013  . Hypothyroidism 06/24/2013    Carney Living PT DPT  06/09/2019, 2:03 PM  [facility] [address] [place], [place], [address] Phone: [phone]   Fax:  [phone]  Name: Stacey Gardner MRN: [id number] Date of Birth: 09/15/1965

## 2019-06-15 ENCOUNTER — Other Ambulatory Visit: Payer: Self-pay

## 2019-06-15 ENCOUNTER — Ambulatory Visit: Payer: BC Managed Care – PPO | Admitting: Physical Therapy

## 2019-06-15 DIAGNOSIS — M79601 Pain in right arm: Secondary | ICD-10-CM

## 2019-06-15 DIAGNOSIS — R209 Unspecified disturbances of skin sensation: Secondary | ICD-10-CM | POA: Diagnosis not present

## 2019-06-15 DIAGNOSIS — M6281 Muscle weakness (generalized): Secondary | ICD-10-CM | POA: Diagnosis not present

## 2019-06-15 DIAGNOSIS — M542 Cervicalgia: Secondary | ICD-10-CM | POA: Diagnosis not present

## 2019-06-15 NOTE — Therapy (Signed)
[facility] [address] [place], [place], [address] Phone: [phone]   Fax:  [phone]  Physical Therapy Treatment  Patient Details  Name: Stacey Gardner MRN: [id number] Date of Birth: 1965-05-22 Referring Provider (PT): Dominica Severin, MD    Encounter Date: 06/15/2019  PT End of Session - 06/15/19 1633    Visit Number  13    Number of Visits  17    Date for PT Re-Evaluation  08/02/19    Authorization Type  BCBS    PT Start Time  1630    PT Stop Time  1715    PT Time Calculation (min)  45 min    Activity Tolerance  Patient tolerated treatment well    Behavior During Therapy  [facility] for tasks assessed/performed       Past Medical History:  Diagnosis Date  . Allergy   . Anemia   . Depression   . Hypothyroid   . Mixed hyperlipidemia 03/04/2016  . Vitamin D deficiency     Past Surgical History:  Procedure Laterality Date  . CERVICAL POLYPECTOMY      There were no vitals filed for this visit.  Subjective Assessment - 06/15/19 1636    Subjective  " I just got my second vaccination and it made me get sick for 1-2 days and I didn't do as much, I am alittle stiff and sore in the shoulder but I feel like I am doing better"    Currently in Pain?  Yes    Pain Score  3     Pain Location  Neck    Pain Orientation  Right    Pain Descriptors / Indicators  Aching    Pain Type  Chronic pain    Pain Onset  More than a month ago    Pain Frequency  Intermittent         [facility] PT Assessment - 06/15/19 0001      Assessment   Medical Diagnosis   Lesion of ulnar nerve, right upper limb G56.21    Referring Provider (PT)  Dominica Severin, MD                    [facility] Adult PT Treatment/Exercise - 06/15/19 0001      Self-Care   Other Self-Care Comments   reviewed how to perform trigger point release and how to use tools to treat trigger points      Neck Exercises: Machines for Strengthening   UBE (Upper Arm  Bike)  L2 x 4 min   2 min forward / backward     Neck Exercises: Theraband   Shoulder Extension  15 reps;Red    Rows  15 reps;Red      Neck Exercises: Standing   Neck Retraction  10 reps;3 secs   pushing head in to ball    Neck Retraction Limitations  Lateral neck sidebending against wall 1 x 10 bil     Wall Push Ups  10 reps    Other Standing Exercises  standing lower trap wall y's 2  10      Traction   Type of Traction  Cervical    Min (lbs)  19    Max (lbs)  24    Hold Time  60    Rest Time  20    Time  10      Manual Therapy   Other Manual Therapy  Trigger point release in upper traps strain/counterstrain, suboccipital release  PT Short Term Goals - 05/05/19 1429      PT SHORT TERM GOAL #1   Title  pt to be I with inital HEp    Period  Weeks    Status  Achieved      PT SHORT TERM GOAL #2   Title  pt to verbalize/ demo proper posture and lifting mechanics to reduce and prevent neck/ shoulder pain    Status  Achieved        PT Long Term Goals - 06/07/19 0942      PT LONG TERM GOAL #1   Title  Pt will report decreased radiculopathy and pain overall in her arms and hands    Baseline  reporting its getting better    Period  Weeks    Status  On-going      PT LONG TERM GOAL #2   Title  increase RUE strength to >/= 4+/5 to assist with lifting/ carrying activities as it relates to ADLS    Period  Weeks    Status  Achieved      PT LONG TERM GOAL #3   Title  Pt will improve overall activity tolerance for funcitonal use of her arms and report improved sleeping quality.    Period  Weeks    Status  On-going      PT LONG TERM GOAL #4   Title  increase FOTO score to </= 38% limited to demo improvement in function    Period  Weeks      PT LONG TERM GOAL #5   Title  pt to be I with all HEP given as of last visit to maintain and progress current level of function    Baseline  continues to progress as able.    Period  Weeks    Status  On-going             Plan - 06/15/19 1640    Clinical Impression Statement  patient reports continued consistncey with her HEP. continued traction followed up with shoulder strengthening and cervical isometrics. reviewed self trigger point release and how to use tools which she noted helped significantly. pt reported no increase of pain end of session.    Examination-Activity Limitations  Carry;Sleep;Lift;Caring for Others    Examination-Participation Restrictions  Meal Prep;Driving;Laundry    PT Treatment/Interventions  ADLs/Self Care Home Management;Cryotherapy;Electrical Stimulation;Iontophoresis 4mg /ml Dexamethasone;Moist Heat;Traction;Ultrasound;Therapeutic activities;Therapeutic exercise;Neuromuscular re-education;Manual techniques;Passive range of motion;Dry needling;Spinal Manipulations;Joint Manipulations;Taping;Patient/family education    PT Next Visit Plan  review/ update HEP PRN, DN PRN., cervical traction, scapular stability / shoulder strengthening. posterior shoulder strengthening. update HEP, continues to progress shoulder strengthening, update HEP for lower trap wall y's, and standing cervical isometrics    PT Home Exercise Plan  [id number] - upper trap stretch, levator scapulae stretching, shoulder self inferior mob, supine chin tuck with head lift, wrist flexor stretch, SNAGS, putty gripping, rows, scaption angle flexion, serratus wall push-up, shoulder flexion with dumbbells, shoulder extension.    Consulted and Agree with Plan of Care  Patient       Patient will benefit from skilled therapeutic intervention in order to improve the following deficits and impairments:  Decreased activity tolerance, Decreased strength, Hypomobility, Increased fascial restricitons, Increased muscle spasms, Pain, Improper body mechanics, Postural dysfunction, Decreased endurance  Visit Diagnosis: Pain in right arm  Muscle weakness (generalized)     Problem List Patient Active Problem List    Diagnosis Date Noted  . Depression 11/16/2018  . Age-related cataract of right eye 11/16/2018  .  Ulnar neuropathy at elbow, right 08/24/2018  . Neck pain 12/23/2017  . Vitamin D deficiency 03/04/2016  . Mixed hyperlipidemia 03/04/2016  . Muscle cramps 03/04/2016  . Paresthesias 03/04/2016  . Allergic rhinitis, cause unspecified 06/24/2013  . Hypothyroidism 06/24/2013   Lulu Riding PT, DPT, LAT, ATC  06/15/19  5:26 PM      [facility] [address] [place], [place], [address] Phone: [phone]   Fax:  [phone]  Name: Stacey Gardner MRN: [id number] Date of Birth: 11/16/1965

## 2019-06-21 ENCOUNTER — Encounter: Payer: BC Managed Care – PPO | Admitting: Physical Therapy

## 2019-06-28 ENCOUNTER — Other Ambulatory Visit: Payer: Self-pay

## 2019-06-28 ENCOUNTER — Encounter: Payer: Self-pay | Admitting: Physical Therapy

## 2019-06-28 ENCOUNTER — Ambulatory Visit: Payer: BC Managed Care – PPO | Admitting: Physical Therapy

## 2019-06-28 DIAGNOSIS — M79601 Pain in right arm: Secondary | ICD-10-CM

## 2019-06-28 DIAGNOSIS — M542 Cervicalgia: Secondary | ICD-10-CM

## 2019-06-28 DIAGNOSIS — R209 Unspecified disturbances of skin sensation: Secondary | ICD-10-CM | POA: Diagnosis not present

## 2019-06-28 DIAGNOSIS — M6281 Muscle weakness (generalized): Secondary | ICD-10-CM | POA: Diagnosis not present

## 2019-06-28 NOTE — Therapy (Signed)
[facility] [address] [place], [place], [address] Phone: [phone]   Fax:  [phone]  Physical Therapy Treatment  Patient Details  Name: Stacey Gardner MRN: [id number] Date of Birth: 10-18-1965 Referring Provider (PT): Dominica Severin, MD    Encounter Date: 06/28/2019  PT End of Session - 06/28/19 1127    Visit Number  14    Number of Visits  17    Date for PT Re-Evaluation  08/02/19    Authorization Type  BCBS    PT Start Time  1103    PT Stop Time  1200    PT Time Calculation (min)  57 min    Activity Tolerance  Patient tolerated treatment well    Behavior During Therapy  [facility] for tasks assessed/performed       Past Medical History:  Diagnosis Date  . Allergy   . Anemia   . Depression   . Hypothyroid   . Mixed hyperlipidemia 03/04/2016  . Vitamin D deficiency     Past Surgical History:  Procedure Laterality Date  . CERVICAL POLYPECTOMY      There were no vitals filed for this visit.  Subjective Assessment - 06/28/19 1108    Subjective  Patient reports she has been doing pretty well. She had an incdent of pain into her right arm but she was able to work on it and ghet her pain down. She continues to have some numbness.    Pertinent History  Cubital tunnel syndrome    How long can you sit comfortably?  unlimited    How long can you stand comfortably?  unlimited    How long can you walk comfortably?  unlimited    Diagnostic tests  OA of spine w/ cervical radiculopathy (Rt C5-6 impingement on recent MRI)    Patient Stated Goals  to reduce pain/ numbness, and go back to normal    Currently in Pain?  No/denies   just has numbness                       [facility] Adult PT Treatment/Exercise - 06/28/19 0001      Neck Exercises: Seated   Neck Retraction  5 secs;15 reps      Shoulder Exercises: Seated   Other Seated Exercises  scapular retraction with ER 2 x 15 wth red theraband    Other  Seated Exercises  bilateral horizontal abduction2 x10       Shoulder Exercises: Standing   Other Standing Exercises  standing wand flexion 2x10  2lb      Traction   Type of Traction  Cervical    Min (lbs)  19    Max (lbs)  24    Hold Time  60    Rest Time  20    Time  10      Manual Therapy   Manual Traction   min gentle cervical traction; sub-occipital release     Other Manual Therapy  Trigger point release in upper traps strain/counterstrain, suboccipital release             PT Education - 06/28/19 1127    Education Details  HEP and symptom mangement    Person(s) Educated  Patient    Methods  Tactile cues;Demonstration;Explanation;Verbal cues    Comprehension  Verbalized understanding;Returned demonstration;Verbal cues required;Tactile cues required       PT Short Term Goals - 05/05/19 1429      PT SHORT TERM GOAL #1  Title  pt to be I with inital HEp    Period  Weeks    Status  Achieved      PT SHORT TERM GOAL #2   Title  pt to verbalize/ demo proper posture and lifting mechanics to reduce and prevent neck/ shoulder pain    Status  Achieved        PT Long Term Goals - 06/07/19 0942      PT LONG TERM GOAL #1   Title  Pt will report decreased radiculopathy and pain overall in her arms and hands    Baseline  reporting its getting better    Period  Weeks    Status  On-going      PT LONG TERM GOAL #2   Title  increase RUE strength to >/= 4+/5 to assist with lifting/ carrying activities as it relates to ADLS    Period  Weeks    Status  Achieved      PT LONG TERM GOAL #3   Title  Pt will improve overall activity tolerance for funcitonal use of her arms and report improved sleeping quality.    Period  Weeks    Status  On-going      PT LONG TERM GOAL #4   Title  increase FOTO score to </= 38% limited to demo improvement in function    Period  Weeks      PT LONG TERM GOAL #5   Title  pt to be I with all HEP given as of last visit to maintain and  progress current level of function    Baseline  continues to progress as able.    Period  Weeks    Status  On-going            Plan - 06/28/19 1201    Clinical Impression Statement  Therapy was able to advance patient to a green band today for postural correction. She tolerated well. She had no increase inpain. Therapy also had patient perfrom a cane lift. She reported fatigue but no pain. Patient had minor spasming in her right upper trap. Therapy perfrome d manual therapy to release spasm. She has some concern over crepitus felt with cervical retraction. She is having no pain. She was advised as long as there is no pain then it liley should improve with time. Therapy will continue to advance as tolerated.    Comorbidities  hx of depression    Examination-Activity Limitations  Carry;Sleep;Lift;Caring for Others    Examination-Participation Restrictions  Meal Prep;Driving;Laundry    Stability/Clinical Decision Making  Evolving/Moderate complexity    Clinical Decision Making  Moderate    Rehab Potential  Good    PT Frequency  1x / week    PT Duration  4 weeks    PT Treatment/Interventions  ADLs/Self Care Home Management;Cryotherapy;Electrical Stimulation;Iontophoresis 4mg /ml Dexamethasone;Moist Heat;Traction;Ultrasound;Therapeutic activities;Therapeutic exercise;Neuromuscular re-education;Manual techniques;Passive range of motion;Dry needling;Spinal Manipulations;Joint Manipulations;Taping;Patient/family education    PT Next Visit Plan  review/ update HEP PRN, DN PRN., cervical traction, scapular stability / shoulder strengthening. posterior shoulder strengthening. update HEP, continues to progress shoulder strengthening, update HEP for lower trap wall y's, and standing cervical isometrics    PT Home Exercise Plan  [id number] - upper trap stretch, levator scapulae stretching, shoulder self inferior mob, supine chin tuck with head lift, wrist flexor stretch, SNAGS, putty gripping, rows, scaption  angle flexion, serratus wall push-up, shoulder flexion with dumbbells, shoulder extension.    Consulted and Agree with Plan of Care  Patient  Patient will benefit from skilled therapeutic intervention in order to improve the following deficits and impairments:  Decreased activity tolerance, Decreased strength, Hypomobility, Increased fascial restricitons, Increased muscle spasms, Pain, Improper body mechanics, Postural dysfunction, Decreased endurance  Visit Diagnosis: Pain in right arm  Muscle weakness (generalized)  Cervicalgia     Problem List Patient Active Problem List   Diagnosis Date Noted  . Depression 11/16/2018  . Age-related cataract of right eye 11/16/2018  . Ulnar neuropathy at elbow, right 08/24/2018  . Neck pain 12/23/2017  . Vitamin D deficiency 03/04/2016  . Mixed hyperlipidemia 03/04/2016  . Muscle cramps 03/04/2016  . Paresthesias 03/04/2016  . Allergic rhinitis, cause unspecified 06/24/2013  . Hypothyroidism 06/24/2013    Carney Living PT DPT  06/28/2019, 1:47 PM  [facility] [address] [place], [place], [address] Phone: [phone]   Fax:  [phone]  Name: Stacey Gardner MRN: [id number] Date of Birth: 09/16/1965

## 2019-07-06 ENCOUNTER — Encounter: Payer: Self-pay | Admitting: Physical Therapy

## 2019-07-06 ENCOUNTER — Ambulatory Visit: Payer: BC Managed Care – PPO | Attending: Orthopedic Surgery | Admitting: Physical Therapy

## 2019-07-06 ENCOUNTER — Other Ambulatory Visit: Payer: Self-pay

## 2019-07-06 DIAGNOSIS — M542 Cervicalgia: Secondary | ICD-10-CM | POA: Diagnosis not present

## 2019-07-06 DIAGNOSIS — M6281 Muscle weakness (generalized): Secondary | ICD-10-CM | POA: Diagnosis not present

## 2019-07-06 DIAGNOSIS — M79601 Pain in right arm: Secondary | ICD-10-CM

## 2019-07-06 NOTE — Therapy (Signed)
[facility] Outpatient Rehabilitation [facility] [address] [place], [place], [address] Phone: [phone]   Fax:  [phone]  Physical Therapy Treatment  Patient Details  Name: Stacey Gardner MRN: [id number] Date of Birth: [birth year] Referring Provider (PT): Dominica Severin, MD    Encounter Date: 07/06/2019  PT End of Session - 07/06/19 1100    Visit Number  15    Number of Visits  17    Date for PT Re-Evaluation  08/02/19    Authorization Type  BCBS    PT Start Time  1100    PT Stop Time  1150    PT Time Calculation (min)  50 min       Past Medical History:  Diagnosis Date  . Allergy   . Anemia   . Depression   . Hypothyroid   . Mixed hyperlipidemia 03/04/2016  . Vitamin D deficiency     Past Surgical History:  Procedure Laterality Date  . CERVICAL POLYPECTOMY      There were no vitals filed for this visit.  Subjective Assessment - 07/06/19 1102    Subjective  "I am doing good. some days the elbow bothered me some with carrying items."    Patient Stated Goals  to reduce pain/ numbness, and go back to normal         [facility] PT Assessment - 07/06/19 0001      Assessment   Medical Diagnosis   Lesion of ulnar nerve, right upper limb G56.21    Referring Provider (PT)  Dominica Severin, MD                     [facility] Adult PT Treatment/Exercise - 07/06/19 0001      Neck Exercises: Machines for Strengthening   Other Machines for Strengthening  elliptical L1 x 5 min ramp L1      Neck Exercises: Theraband   Shoulder Extension  15 reps;Green    Rows  15 reps;Green      Shoulder Exercises: Standing   Other Standing Exercises  walking and carrying 10# kettlebell 4 x 50 ft      Traction   Type of Traction  Cervical    Min (lbs)  19    Max (lbs)  24    Hold Time  60    Rest Time  20    Time  10      Manual Therapy   Other Manual Therapy  Trigger point release in upper traps strain/counterstrain, suboccipital release              PT Education - 07/06/19 1110    Education Details  pt arrived with a list of questions she wanted to address before starting the session. reviewed trigger point release and how to perform at home with tools.    Person(s) Educated  Patient    Methods  Explanation;Verbal cues;Handout    Comprehension  Verbalized understanding;Verbal cues required       PT Short Term Goals - 05/05/19 1429      PT SHORT TERM GOAL #1   Title  pt to be I with inital HEp    Period  Weeks    Status  Achieved      PT SHORT TERM GOAL #2   Title  pt to verbalize/ demo proper posture and lifting mechanics to reduce and prevent neck/ shoulder pain    Status  Achieved        PT Long Term Goals - 06/07/19 0102  PT LONG TERM GOAL #1   Title  Pt will report decreased radiculopathy and pain overall in her arms and hands    Baseline  reporting its getting better    Period  Weeks    Status  On-going      PT LONG TERM GOAL #2   Title  increase RUE strength to >/= 4+/5 to assist with lifting/ carrying activities as it relates to ADLS    Period  Weeks    Status  Achieved      PT LONG TERM GOAL #3   Title  Pt will improve overall activity tolerance for funcitonal use of her arms and report improved sleeping quality.    Period  Weeks    Status  On-going      PT LONG TERM GOAL #4   Title  increase FOTO score to </= 38% limited to demo improvement in function    Period  Weeks      PT LONG TERM GOAL #5   Title  pt to be I with all HEP given as of last visit to maintain and progress current level of function    Baseline  continues to progress as able.    Period  Weeks    Status  On-going            Plan - 07/06/19 1116    Clinical Impression Statement  pt continues to make good progress with phsical therapy. increased time taken to address pt's questions she had written down prior to today's session. focused on pt lead trigger point release using theracane and strengthening which  she did very well with. continued traction end of session. plan to reassess next session and review/ update HEP and discharge.    PT Treatment/Interventions  ADLs/Self Care Home Management;Cryotherapy;Electrical Stimulation;Iontophoresis 4mg /ml Dexamethasone;Moist Heat;Traction;Ultrasound;Therapeutic activities;Therapeutic exercise;Neuromuscular re-education;Manual techniques;Passive range of motion;Dry needling;Spinal Manipulations;Joint Manipulations;Taping;Patient/family education    PT Next Visit Plan  review/ update HEP, assess ROM and shoulder strength    PT Home Exercise Plan  [id number] - upper trap stretch, levator scapulae stretching, shoulder self inferior mob, supine chin tuck with head lift, wrist flexor stretch, SNAGS, putty gripping, rows, scaption angle flexion, serratus wall push-up, shoulder flexion with dumbbells, shoulder extension.    Consulted and Agree with Plan of Care  Patient       Patient will benefit from skilled therapeutic intervention in order to improve the following deficits and impairments:  Decreased activity tolerance, Decreased strength, Hypomobility, Increased fascial restricitons, Increased muscle spasms, Pain, Improper body mechanics, Postural dysfunction, Decreased endurance  Visit Diagnosis: Pain in right arm  Muscle weakness (generalized)  Cervicalgia     Problem List Patient Active Problem List   Diagnosis Date Noted  . Depression 11/16/2018  . Age-related cataract of right eye 11/16/2018  . Ulnar neuropathy at elbow, right 08/24/2018  . Neck pain 12/23/2017  . Vitamin D deficiency 03/04/2016  . Mixed hyperlipidemia 03/04/2016  . Muscle cramps 03/04/2016  . Paresthesias 03/04/2016  . Allergic rhinitis, cause unspecified 06/24/2013  . Hypothyroidism 06/24/2013   Starr Lake PT, DPT, LAT, ATC  07/06/19  11:47 AM      [facility] [address] [place], [place], [address] Phone:  [phone]   Fax:  [phone]  Name: Stacey Gardner MRN: [id number] Date of Birth: [birth year]

## 2019-07-07 ENCOUNTER — Other Ambulatory Visit: Payer: Self-pay | Admitting: Internal Medicine

## 2019-07-07 MED ORDER — ALBUTEROL SULFATE HFA 108 (90 BASE) MCG/ACT IN AERS: 2.0000 | INHALATION_SPRAY | Freq: Four times a day (QID) | RESPIRATORY_TRACT | 0 refills | Status: DC | PRN

## 2019-07-13 ENCOUNTER — Encounter: Payer: Self-pay | Admitting: Physical Therapy

## 2019-07-13 ENCOUNTER — Encounter: Payer: BC Managed Care – PPO | Admitting: Physical Therapy

## 2019-07-13 ENCOUNTER — Ambulatory Visit: Payer: BC Managed Care – PPO | Admitting: Physical Therapy

## 2019-07-13 ENCOUNTER — Other Ambulatory Visit: Payer: Self-pay

## 2019-07-13 DIAGNOSIS — M542 Cervicalgia: Secondary | ICD-10-CM | POA: Diagnosis not present

## 2019-07-13 DIAGNOSIS — M79601 Pain in right arm: Secondary | ICD-10-CM | POA: Diagnosis not present

## 2019-07-13 DIAGNOSIS — M6281 Muscle weakness (generalized): Secondary | ICD-10-CM | POA: Diagnosis not present

## 2019-07-13 NOTE — Therapy (Signed)
[place], [place], [address] Phone: [phone]   Fax:  [phone]  Physical Therapy Treatment / discharge  Patient Details  Name: Stacey Gardner MRN: [id number] Date of Birth: [birth year] Referring Provider (PT): Roseanne Kaufman, MD    Encounter Date: 07/13/2019  PT End of Session - 07/13/19 1149    Visit Number  16    Number of Visits  17    Date for PT Re-Evaluation  08/02/19    PT Start Time  1147    PT Stop Time  1220    PT Time Calculation (min)  33 min    Activity Tolerance  Patient tolerated treatment well    Behavior During Therapy  [facility] for tasks assessed/performed       Past Medical History:  Diagnosis Date  . Allergy   . Anemia   . Depression   . Hypothyroid   . Mixed hyperlipidemia 03/04/2016  . Vitamin D deficiency     Past Surgical History:  Procedure Laterality Date  . CERVICAL POLYPECTOMY      There were no vitals filed for this visit.  Subjective Assessment - 07/13/19 1151    Subjective  "I went to the gym with my husband and tried the elliptical and I think it was really good. I feel like I have really made progress this time around."    Patient Stated Goals  to reduce pain/ numbness, and go back to normal    Currently in Pain?  No/denies    Pain Orientation  Right    Aggravating Factors   pushing husband in W/C, over use of the RUE    Pain Relieving Factors  heat, rest, exercise, stretching         [facility] PT Assessment - 07/13/19 0001      Assessment   Medical Diagnosis   Lesion of ulnar nerve, right upper limb G56.21    Referring Provider (PT)  Roseanne Kaufman, MD       Observation/Other Assessments   Focus on Therapeutic Outcomes (FOTO)   26% limited      Strength   Right Shoulder Flexion  5/5    Right Shoulder Extension  5/5    Right Shoulder ABduction  5/5    Right Shoulder Internal Rotation  5/5    Right Shoulder External Rotation  5/5    Right Elbow  Flexion  5/5    Right Elbow Extension  5/5    Right Hand Grip (lbs)  57   60,51,60                   [facility] Adult PT Treatment/Exercise - 07/13/19 0001      Shoulder Exercises: Standing   External Rotation  Strengthening;Right;Theraband;10 reps    Theraband Level (Shoulder External Rotation)  Level 4 (Blue)    Internal Rotation  Strengthening;Right;10 reps;Theraband    Theraband Level (Shoulder Internal Rotation)  Level 4 (Blue)    Other Standing Exercises  scapular retraction with bil ER 1 x 12 with blue theraband             PT Education - 07/13/19 1314    Education Details  Reviewed HEP and updated today. reviewed how to progress strengthening with increased reps/ sets and resistance. Reviewed final FOTO assessment and provided final handout.    Person(s) Educated  Patient    Methods  Explanation;Verbal cues;Handout    Comprehension  Verbalized understanding;Verbal cues required  PT Short Term Goals - 05/05/19 1429      PT SHORT TERM GOAL #1   Title  pt to be I with inital HEp    Period  Weeks    Status  Achieved      PT SHORT TERM GOAL #2   Title  pt to verbalize/ demo proper posture and lifting mechanics to reduce and prevent neck/ shoulder pain    Status  Achieved        PT Long Term Goals - 07/13/19 1158      PT LONG TERM GOAL #1   Title  Pt will report decreased radiculopathy and pain overall in her arms and hands    Period  Weeks    Status  Achieved      PT LONG TERM GOAL #2   Title  increase RUE strength to >/= 4+/5 to assist with lifting/ carrying activities as it relates to ADLS    Period  Weeks    Status  Achieved      PT LONG TERM GOAL #3   Title  Pt will improve overall activity tolerance for funcitonal use of her arms and report improved sleeping quality.    Period  Weeks    Status  Achieved      PT LONG TERM GOAL #4   Title  increase FOTO score to </= 38% limited to demo improvement in function    Period  Weeks     Status  Achieved      PT LONG TERM GOAL #5   Title  pt to be I with all HEP given as of last visit to maintain and progress current level of function    Period  Weeks    Status  Achieved            Plan - 07/13/19 1315    Clinical Impression Statement  Mrs Esch has made great progress with physical therapy incease shoulder/ neck mobility, strength and additionally reports no pain today. reviewed HEP and updated for rotator cuff strengthening. she met all goals today and improved her FOTO to 26% limited. She is able to maintain and progress her current level of function and will be discharged from PT today    PT Treatment/Interventions  ADLs/Self Care Home Management;Cryotherapy;Electrical Stimulation;Iontophoresis '4mg'$ /ml Dexamethasone;Moist Heat;Traction;Ultrasound;Therapeutic activities;Therapeutic exercise;Neuromuscular re-education;Manual techniques;Passive range of motion;Dry needling;Spinal Manipulations;Joint Manipulations;Taping;Patient/family education    PT Next Visit Plan  d/c Today    PT Home Exercise Plan  [id number] - upper trap stretch, levator scapulae stretching, shoulder self inferior mob, supine chin tuck with head lift, wrist flexor stretch, SNAGS, putty gripping, rows, scaption angle flexion, serratus wall push-up, shoulder flexion with dumbbells, shoulder extension. shoulder IR, shoulder ER, and scapular retraction with ER    Consulted and Agree with Plan of Care  Patient       Patient will benefit from skilled therapeutic intervention in order to improve the following deficits and impairments:  Decreased activity tolerance, Decreased strength, Hypomobility, Increased fascial restricitons, Increased muscle spasms, Pain, Improper body mechanics, Postural dysfunction, Decreased endurance  Visit Diagnosis: Pain in right arm  Muscle weakness (generalized)  Cervicalgia     Problem List Patient Active Problem List   Diagnosis Date Noted  . Depression 11/16/2018   . Age-related cataract of right eye 11/16/2018  . Ulnar neuropathy at elbow, right 08/24/2018  . Neck pain 12/23/2017  . Vitamin D deficiency 03/04/2016  . Mixed hyperlipidemia 03/04/2016  . Muscle cramps 03/04/2016  . Paresthesias 03/04/2016  .  Allergic rhinitis, cause unspecified 06/24/2013  . Hypothyroidism 06/24/2013    [place] 07/13/2019, 1:18 PM  [facility] [address] [place], [place], [address] Phone: [phone]   Fax:  [phone]  Name: Stacey Gardner MRN: [id number] Date of Birth: 08-13-1965       PHYSICAL THERAPY DISCHARGE SUMMARY  Visits from Start of Care: 16  Current functional level related to goals / functional outcomes: See goals, FOTO 26% limited   Remaining deficits: Intermittent stiffness in the R shoulder/ neck.    Education / Equipment: HEP, theraband, posture, lifting biomechanics,   Plan: Patient agrees to discharge.  Patient goals were met. Patient is being discharged due to being pleased with the current functional level.  ?????         Britne Borelli PT, DPT, LAT, ATC  07/13/19  1:19 PM

## 2019-08-13 ENCOUNTER — Encounter: Payer: Self-pay | Admitting: Family Medicine

## 2019-08-14 ENCOUNTER — Other Ambulatory Visit: Payer: Self-pay | Admitting: Family Medicine

## 2019-08-14 MED ORDER — MONTELUKAST SODIUM 10 MG PO TABS: 10.0000 mg | ORAL_TABLET | Freq: Every day | ORAL | 1 refills | Status: DC

## 2019-08-20 ENCOUNTER — Other Ambulatory Visit: Payer: Self-pay | Admitting: Family Medicine

## 2019-08-20 DIAGNOSIS — E039 Hypothyroidism, unspecified: Secondary | ICD-10-CM

## 2019-09-29 DIAGNOSIS — G5621 Lesion of ulnar nerve, right upper limb: Secondary | ICD-10-CM | POA: Diagnosis not present

## 2019-09-29 DIAGNOSIS — M25522 Pain in left elbow: Secondary | ICD-10-CM | POA: Diagnosis not present

## 2019-09-29 DIAGNOSIS — M25521 Pain in right elbow: Secondary | ICD-10-CM | POA: Diagnosis not present

## 2019-11-01 DIAGNOSIS — G5622 Lesion of ulnar nerve, left upper limb: Secondary | ICD-10-CM | POA: Diagnosis not present

## 2019-11-01 DIAGNOSIS — G5621 Lesion of ulnar nerve, right upper limb: Secondary | ICD-10-CM | POA: Diagnosis not present

## 2019-11-08 DIAGNOSIS — G5621 Lesion of ulnar nerve, right upper limb: Secondary | ICD-10-CM | POA: Diagnosis not present

## 2019-11-08 DIAGNOSIS — M25522 Pain in left elbow: Secondary | ICD-10-CM | POA: Diagnosis not present

## 2019-11-08 DIAGNOSIS — M25521 Pain in right elbow: Secondary | ICD-10-CM | POA: Diagnosis not present

## 2019-11-15 ENCOUNTER — Encounter: Payer: Self-pay | Admitting: Family Medicine

## 2019-11-17 ENCOUNTER — Other Ambulatory Visit: Payer: Self-pay | Admitting: Family Medicine

## 2019-11-17 DIAGNOSIS — E039 Hypothyroidism, unspecified: Secondary | ICD-10-CM

## 2019-11-17 NOTE — Telephone Encounter (Signed)
Pt has an appt tomorrow °

## 2019-11-18 ENCOUNTER — Encounter: Payer: Self-pay | Admitting: Family Medicine

## 2019-11-18 ENCOUNTER — Other Ambulatory Visit: Payer: Self-pay

## 2019-11-18 ENCOUNTER — Ambulatory Visit (INDEPENDENT_AMBULATORY_CARE_PROVIDER_SITE_OTHER): Payer: BC Managed Care – PPO | Admitting: Family Medicine

## 2019-11-18 VITALS — BP 118/80 | HR 66 | Ht 67.0 in | Wt 150.8 lb

## 2019-11-18 DIAGNOSIS — L700 Acne vulgaris: Secondary | ICD-10-CM

## 2019-11-18 DIAGNOSIS — Z0000 Encounter for general adult medical examination without abnormal findings: Secondary | ICD-10-CM | POA: Diagnosis not present

## 2019-11-18 DIAGNOSIS — R5383 Other fatigue: Secondary | ICD-10-CM

## 2019-11-18 DIAGNOSIS — Z23 Encounter for immunization: Secondary | ICD-10-CM | POA: Diagnosis not present

## 2019-11-18 DIAGNOSIS — R3915 Urgency of urination: Secondary | ICD-10-CM

## 2019-11-18 DIAGNOSIS — Z1159 Encounter for screening for other viral diseases: Secondary | ICD-10-CM

## 2019-11-18 DIAGNOSIS — E039 Hypothyroidism, unspecified: Secondary | ICD-10-CM | POA: Diagnosis not present

## 2019-11-18 DIAGNOSIS — E559 Vitamin D deficiency, unspecified: Secondary | ICD-10-CM | POA: Diagnosis not present

## 2019-11-18 DIAGNOSIS — E782 Mixed hyperlipidemia: Secondary | ICD-10-CM

## 2019-11-18 DIAGNOSIS — T733XXA Exhaustion due to excessive exertion, initial encounter: Secondary | ICD-10-CM | POA: Insufficient documentation

## 2019-11-18 DIAGNOSIS — F32A Depression, unspecified: Secondary | ICD-10-CM | POA: Diagnosis not present

## 2019-11-18 HISTORY — DX: Other fatigue: R53.83

## 2019-11-18 LAB — POCT URINALYSIS DIP (PROADVANTAGE DEVICE)
Blood, UA: NEGATIVE
Glucose, UA: NEGATIVE mg/dL
Leukocytes, UA: NEGATIVE
Nitrite, UA: NEGATIVE
Specific Gravity, Urine: 1.03
Urobilinogen, Ur: 0.2
pH, UA: 6 (ref 5.0–8.0)

## 2019-11-18 MED ORDER — MINOCYCLINE HCL 100 MG PO CAPS: 100.0000 mg | ORAL_CAPSULE | Freq: Every day | ORAL | 0 refills | Status: DC

## 2019-11-18 NOTE — Progress Notes (Signed)
Complete physical exam   Patient: Stacey Gardner   DOB: [birth year]   54 y.o. Female  MRN: [id number] Visit Date: 11/18/2019  Today's healthcare provider: Hetty Blend, NP-C   Chief Complaint  Patient presents with  . Annual Exam  . Hypothyroidism   Subjective    Stacey Gardner is a 54 y.o. female who presents today for a complete physical exam.  She reports consuming a low sodium and healthy diet. Home exercise routine includes walking once daily for 40 mintues . She generally feels well. She reports sleeping well. She does have additional problems to discuss today.  HPI  Hypothyroidism Patient presents today for hypothyroidism. She is currently taking Levothyroxine 88 MCG and reports good compliance with treatment.  Drinking almond milk. Is not eating meat.  She is walking daily.  Mammogram - due in November and will call  Pap smear- Dr Juliene Pina at [facility]  Colonoscopy- 2019  Dentist- Dr. Deveron Furlong- last year   Other providers: Neurologist- Dr. Lucia Gaskins  Orthopedist- Dr. Roda Shutters Dr. Amanda Pea  [facility]/GYN- [facility]  GI- Dr. Corine Shelter doctor- Dr. Hyacinth Meeker   Her mood is good. Taking Effexor and doing well.   Has been on minocycline for acne. She took this in [place]. Requests refill for flare up. Acute outbreak around her mouth and chin.   States she is waking up tired and falls asleep when she sits down during the day.   Social history: Lives withhusband who is wheelchair dependent due to CVA,does not work. She takes [employer] to get to her appointments. She does not drive. Denies smoking, drinking alcohol, drug use   Immunizations- Tdap in 2017 at [facility] and Shingrix in 2019. Flu shot today.    Past Medical History:  Diagnosis Date  . Allergy   . Anemia   . Depression   . Hypothyroid   . Mixed hyperlipidemia 03/04/2016  . Vitamin D deficiency    Past Surgical History:  Procedure Laterality Date  . CERVICAL POLYPECTOMY     Social History    Socioeconomic History  . Marital status: Married    Spouse name: Not on file  . Number of children: 0  . Years of education: 5 years of college in fine arts, received degree (unsure what level)  . Highest education level: Not on file  Occupational History  . Not on file  Tobacco Use  . Smoking status: Never Smoker  . Smokeless tobacco: Never Used  Substance and Sexual Activity  . Alcohol use: No    Alcohol/week: 0.0 standard drinks  . Drug use: No  . Sexual activity: Not on file  Other Topics Concern  . Not on file  Social History Narrative   Married. Education: Other      Lives at home with her husband. She takes care of him and he is wheelchair bound.   Right handed   Social Determinants of Health   Financial Resource Strain:   . Difficulty of Paying Living Expenses: Not on file  Food Insecurity:   . Worried About Programme researcher, broadcasting/film/video in the Last Year: Not on file  . Ran Out of Food in the Last Year: Not on file  Transportation Needs:   . Lack of Transportation (Medical): Not on file  . Lack of Transportation (Non-Medical): Not on file  Physical Activity:   . Days of Exercise per Week: Not on file  . Minutes of Exercise per Session: Not on file  Stress:   .  Feeling of Stress : Not on file  Social Connections:   . Frequency of Communication with Friends and Family: Not on file  . Frequency of Social Gatherings with Friends and Family: Not on file  . Attends Religious Services: Not on file  . Active Member of Clubs or Organizations: Not on file  . Attends Banker Meetings: Not on file  . Marital Status: Not on file  Intimate Partner Violence:   . Fear of Current or Ex-Partner: Not on file  . Emotionally Abused: Not on file  . Physically Abused: Not on file  . Sexually Abused: Not on file   Family Status  Relation Name Status  . Mother  Deceased at age 72  . Father  Alive   Family History  Problem Relation Age of Onset  . Cancer Mother 85        breast  . AAA (abdominal aortic aneurysm) Mother   . Heart disease Father    Allergies  Allergen Reactions  . Codeine Nausea Only  . Cough Syrup [Guaifenesin]     Some cough syrups with codeine  . Cortisone Rash    cream  . Neosporin [Neomycin-Polymyxin-Gramicidin] Rash    Patient Care Team: Avanell Shackleton, NP-C as PCP - General (Family Medicine)   Medications: Outpatient Medications Prior to Visit  Medication Sig  . acetaminophen (TYLENOL 8 HOUR ARTHRITIS PAIN) 650 MG CR tablet Take 1,300 mg by mouth as needed for pain.   Marland Kitchen albuterol (VENTOLIN HFA) 108 (90 Base) MCG/ACT inhaler Inhale 2 puffs into the lungs every 6 (six) hours as needed for wheezing or shortness of breath.  . Ascorbic Acid (VITAMIN C) 500 MG CHEW Chew by mouth.  . Biotin (BIOTIN 5000) 5 MG CAPS Take by mouth.  . calcium carbonate (TITRALAC) 420 MG CHEW chewable tablet Chew 420 mg by mouth.  . Cholecalciferol (VITAMIN D3 PO) Take by mouth.  . levothyroxine (SYNTHROID) 88 MCG tablet TAKE ONE TABLET BY MOUTH EVERY MORNING BEFORE BREAKFAST  . montelukast (SINGULAIR) 10 MG tablet Take 1 tablet (10 mg total) by mouth at bedtime.  . Omega-3 Fatty Acids (FISH OIL) 1200 MG CAPS Take by mouth.  . pyridoxine (B-6) 100 MG tablet Take 100 mg by mouth daily.  Marland Kitchen venlafaxine (EFFEXOR) 37.5 MG tablet Take 37.5 mg by mouth 2 (two) times daily.  . vitamin E 200 UNIT capsule Take 200 Units by mouth daily.  . [DISCONTINUED] minocycline (MINOCIN,DYNACIN) 100 MG capsule Take 100 mg by mouth daily.   No facility-administered medications prior to visit.    Review of Systems Review of Systems Constitutional: -fever, -chills, -sweats, -unexpected weight change,-+atigue ENT: -runny nose, -ear pain, -sore throat Cardiology:  -chest pain, -palpitations, -edema Respiratory: -cough, -shortness of breath, -wheezing Gastroenterology: -abdominal pain, -nausea, -vomiting, -diarrhea, -constipation  Hematology: -bleeding or bruising  problems Musculoskeletal: -arthralgias, -myalgias, -joint swelling, -back pain Ophthalmology: -vision changes Urology: -dysuria, -difficulty urinating, -hematuria, -urinary frequency, +urgency (last week) Neurology: -headache, -weakness, -tingling, -numbness     Objective    BP 118/80 (BP Location: Right Arm, Patient Position: Sitting, Cuff Size: Large)   Pulse 66   Ht 5\' 7"  (1.702 m)   Wt 150 lb 12.8 oz (68.4 kg)   SpO2 99%   BMI 23.62 kg/m    Physical Exam  BP 118/80 (BP Location: Right Arm, Patient Position: Sitting, Cuff Size: Large)   Pulse 66   Ht 5\' 7"  (1.702 m)   Wt 150 lb 12.8 oz (68.4 kg)  SpO2 99%   BMI 23.62 kg/m   General Appearance:    Alert, cooperative, no distress, appears stated age  Head:    Normocephalic, without obvious abnormality, atraumatic  Eyes:    PERRL, conjunctiva/corneas clear, EOM's intact  Ears:    Normal TM's and external ear canals  Nose:  mask on   Throat:  mask on   Neck:   Supple, no lymphadenopathy;  thyroid:  no   enlargement/tenderness/nodules; no JVD  Back:    Spine nontender, no curvature, ROM normal, no CVA     tenderness  Lungs:     Clear to auscultation bilaterally without wheezes, rales or     ronchi; respirations unlabored  Chest Wall:    No tenderness or deformity   Heart:    Regular rate and rhythm, S1 and S2 normal, no murmur, rub   or gallop  Breast Exam:    obgyn  Abdomen:     Soft, non-tender, nondistended, normoactive bowel sounds,    no masses, no hepatosplenomegaly  Genitalia:    obgyn     Extremities:   No clubbing, cyanosis or edema  Pulses:   2+ and symmetric all extremities  Skin:   Skin color, texture, turgor normal, inflammatory acne to chin  Lymph nodes:   Cervical, supraclavicular, and axillary nodes normal  Neurologic:   CNII-XII intact, normal strength, sensation and gait          Psych:   Normal mood, affect, hygiene and grooming.     Last depression screening scores PHQ 2/9 Scores 11/18/2019  11/16/2018 09/04/2017  PHQ - 2 Score 0 0 0  PHQ- 9 Score 1 - -  Exception Documentation - - -   Last fall risk screening Fall Risk  11/16/2018  Falls in the past year? 0  Number falls in past yr: 0  Injury with Fall? 0  Risk for fall due to : -   Last Audit-C alcohol use screening No flowsheet data found. A score of 3 or more in women, and 4 or more in men indicates increased risk for alcohol abuse, EXCEPT if all of the points are from question 1   Results for orders placed or performed in visit on 11/18/19  POCT Urinalysis DIP (Proadvantage Device)  Result Value Ref Range   Color, UA     Clarity, UA     Glucose, UA negative negative mg/dL   Bilirubin, UA small (A) negative   Ketones, POC UA trace (5) (A) negative mg/dL   Specific Gravity, Urine 1.030    Blood, UA negative negative   pH, UA 6.0 5.0 - 8.0   Protein Ur, POC trace (A) negative mg/dL   Urobilinogen, Ur 0.2    Nitrite, UA Negative Negative   Leukocytes, UA Negative Negative    Assessment & Plan    Routine Health Maintenance and Physical Exam Annual physical exam - Plan: TSH, Lipid panel, Comprehensive metabolic panel, CBC with Differential/Platelet -preventive health care reviewed. She sess OB/GYN. Colonoscopy UTD. Congratulated her on healthy diet changes and exercise. Immunizations reviewed and she appears to be UTD. Upcoming trip to [place] to see her husband. Discussed safety and health promotion.   Hypothyroidism, unspecified type - Plan: TSH, T4, free -check labs and follow up  Need for hepatitis C screening test - Plan: Hepatitis C antibody -done per screening guidelines   Vitamin D deficiency - Plan: VITAMIN D 25 Hydroxy (Vit-D Deficiency, Fractures) -follow up and adjust dose as needed   Mixed hyperlipidemia -  Plan: Lipid panel -continue low fat and low cholesterol diet. She has made healthy changes. Continue exercise.   Depression, unspecified depression type -mood is good. Continue  Effexor  Urinary urgency - Plan: POCT Urinalysis DIP (Proadvantage Device) -resolved. No sign of infection on UA  Fatigue, unspecified type - Plan: TSH, T4, free, VITAMIN D 25 Hydroxy (Vit-D Deficiency, Fractures), Vitamin B12 -check labs and follow up  Need for influenza vaccination - Plan: Flu Vaccine QUAD 36+ mos IM  Superficial inflammatory acne vulgaris - Plan: minocycline (MINOCIN) 100 MG capsule -minocycline has worked for her in the past. I will refill this per her request. Follow up if not back to baseline.    Exercise Activities and Dietary recommendations Goals   None     Immunization History  Administered Date(s) Administered  . Influenza,inj,Quad PF,6+ Mos 11/18/2017, 11/06/2018, 11/18/2019  . Influenza-Unspecified 12/14/2015, 11/18/2017  . Moderna SARS-COVID-2 Vaccination 05/14/2019, 06/11/2019  . Zoster Recombinat (Shingrix) 12/15/2017, 03/16/2018    Health Maintenance  Topic Date Due  . Hepatitis C Screening  Never done  . TETANUS/TDAP  09/09/2015  . PAP SMEAR-Modifier  08/16/2018  . MAMMOGRAM  08/22/2019  . COLONOSCOPY  12/30/2027  . INFLUENZA VACCINE  Completed  . COVID-19 Vaccine  Completed  . HIV Screening  Completed    Discussed health benefits of physical activity, and encouraged her to engage in regular exercise appropriate for her age and condition.  Return in about 6 months (around 05/18/2020).      Hetty Blend, NP-C  [facility] [phone] (phone) [phone] (fax)  [facility]

## 2019-11-18 NOTE — Patient Instructions (Signed)

## 2019-11-19 ENCOUNTER — Other Ambulatory Visit: Payer: Self-pay | Admitting: Internal Medicine

## 2019-11-19 DIAGNOSIS — L700 Acne vulgaris: Secondary | ICD-10-CM

## 2019-11-19 LAB — CBC WITH DIFFERENTIAL/PLATELET
Basophils Absolute: 0.1 10*3/uL (ref 0.0–0.2)
Basos: 1 %
EOS (ABSOLUTE): 0.1 10*3/uL (ref 0.0–0.4)
Eos: 1 %
Hematocrit: 41 % (ref 34.0–46.6)
Hemoglobin: 14.1 g/dL (ref 11.1–15.9)
Immature Grans (Abs): 0 10*3/uL (ref 0.0–0.1)
Immature Granulocytes: 0 %
Lymphocytes Absolute: 2.4 10*3/uL (ref 0.7–3.1)
Lymphs: 41 %
MCH: 30.9 pg (ref 26.6–33.0)
MCHC: 34.4 g/dL (ref 31.5–35.7)
MCV: 90 fL (ref 79–97)
Monocytes Absolute: 0.5 10*3/uL (ref 0.1–0.9)
Monocytes: 8 %
Neutrophils Absolute: 3 10*3/uL (ref 1.4–7.0)
Neutrophils: 49 %
Platelets: 337 10*3/uL (ref 150–450)
RBC: 4.57 x10E6/uL (ref 3.77–5.28)
RDW: 12.9 % (ref 11.7–15.4)
WBC: 6 10*3/uL (ref 3.4–10.8)

## 2019-11-19 LAB — LIPID PANEL
Chol/HDL Ratio: 5.1 ratio — ABNORMAL HIGH (ref 0.0–4.4)
Cholesterol, Total: 280 mg/dL — ABNORMAL HIGH (ref 100–199)
HDL: 55 mg/dL (ref >39)
LDL Chol Calc (NIH): 185 mg/dL — ABNORMAL HIGH (ref 0–99)
Triglycerides: 210 mg/dL — ABNORMAL HIGH (ref 0–149)
VLDL Cholesterol Cal: 40 mg/dL (ref 5–40)

## 2019-11-19 LAB — COMPREHENSIVE METABOLIC PANEL
ALT: 19 IU/L (ref 0–32)
AST: 22 IU/L (ref 0–40)
Albumin/Globulin Ratio: 1.8 (ref 1.2–2.2)
Albumin: 5 g/dL — ABNORMAL HIGH (ref 3.8–4.9)
Alkaline Phosphatase: 134 IU/L — ABNORMAL HIGH (ref 44–121)
BUN/Creatinine Ratio: 18 (ref 9–23)
BUN: 17 mg/dL (ref 6–24)
Bilirubin Total: 0.4 mg/dL (ref 0.0–1.2)
CO2: 26 mmol/L (ref 20–29)
Calcium: 10.3 mg/dL — ABNORMAL HIGH (ref 8.7–10.2)
Chloride: 101 mmol/L (ref 96–106)
Creatinine, Ser: 0.97 mg/dL (ref 0.57–1.00)
GFR calc Af Amer: 77 mL/min/{1.73_m2} (ref >59)
GFR calc non Af Amer: 66 mL/min/{1.73_m2} (ref >59)
Globulin, Total: 2.8 g/dL (ref 1.5–4.5)
Glucose: 81 mg/dL (ref 65–99)
Potassium: 4.8 mmol/L (ref 3.5–5.2)
Sodium: 141 mmol/L (ref 134–144)
Total Protein: 7.8 g/dL (ref 6.0–8.5)

## 2019-11-19 LAB — VITAMIN B12: Vitamin B-12: 651 pg/mL (ref 232–1245)

## 2019-11-19 LAB — VITAMIN D 25 HYDROXY (VIT D DEFICIENCY, FRACTURES): Vit D, 25-Hydroxy: 37.5 ng/mL (ref 30.0–100.0)

## 2019-11-19 LAB — HEPATITIS C ANTIBODY: Hep C Virus Ab: <0.1 s/co ratio (ref 0.0–0.9)

## 2019-11-19 LAB — T4, FREE: Free T4: 1.7 ng/dL (ref 0.82–1.77)

## 2019-11-19 LAB — TSH: TSH: 0.883 u[IU]/mL (ref 0.450–4.500)

## 2019-11-19 MED ORDER — MINOCYCLINE HCL 100 MG PO CAPS: 100.0000 mg | ORAL_CAPSULE | Freq: Every day | ORAL | 0 refills | Status: DC

## 2019-11-19 NOTE — Progress Notes (Signed)
Called in med to pharmacy  

## 2019-11-20 ENCOUNTER — Other Ambulatory Visit: Payer: Self-pay | Admitting: Family Medicine

## 2019-11-20 DIAGNOSIS — E039 Hypothyroidism, unspecified: Secondary | ICD-10-CM

## 2019-12-02 ENCOUNTER — Encounter: Payer: Self-pay | Admitting: Family Medicine

## 2019-12-03 ENCOUNTER — Other Ambulatory Visit: Payer: Self-pay | Admitting: Physician Assistant

## 2019-12-03 DIAGNOSIS — L700 Acne vulgaris: Secondary | ICD-10-CM

## 2019-12-06 DIAGNOSIS — Z20822 Contact with and (suspected) exposure to covid-19: Secondary | ICD-10-CM | POA: Diagnosis not present

## 2019-12-08 ENCOUNTER — Telehealth: Payer: Self-pay | Admitting: Family Medicine

## 2019-12-08 IMAGING — US US THYROID
1 series · 14 of 25 positions shown · non-contrast
Comparison: None.

CLINICAL DATA: Enlarged thyroid on exam, dysphagia

EXAM:
THYROID ULTRASOUND
TECHNIQUE: Ultrasound examination of the thyroid gland and adjacent soft
tissues was performed.

[Series 1: us thyroid · 0.05mm/px · 14 of 37 slices shown]
[im 1/37]
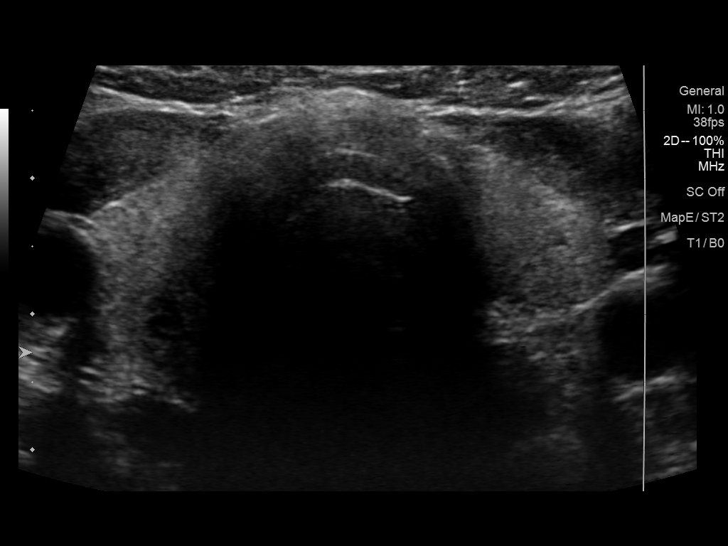
[im 4/37]
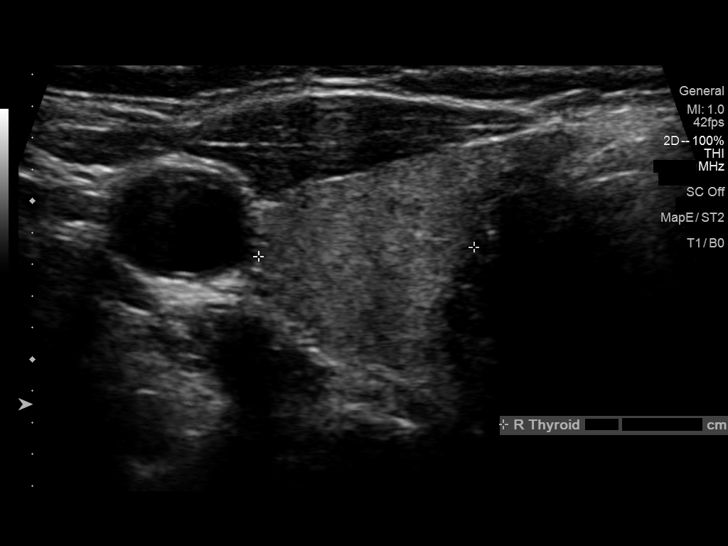
[im 7/37]
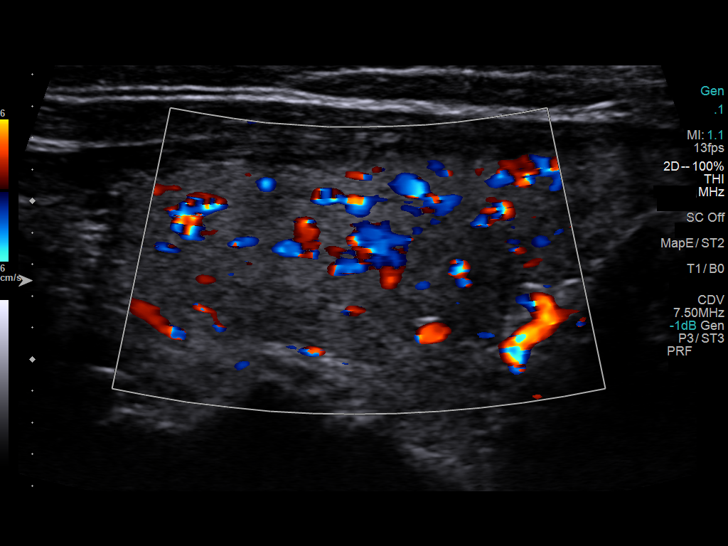
[im 10/37]
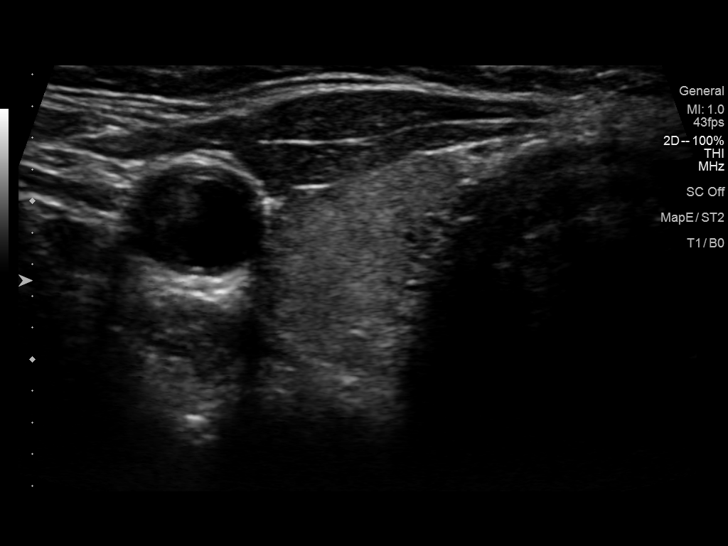
[im 13/37]
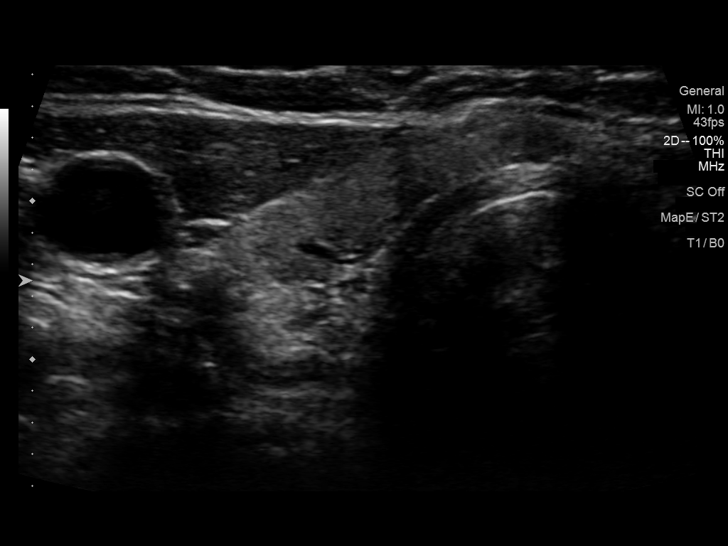
[im 14/37]
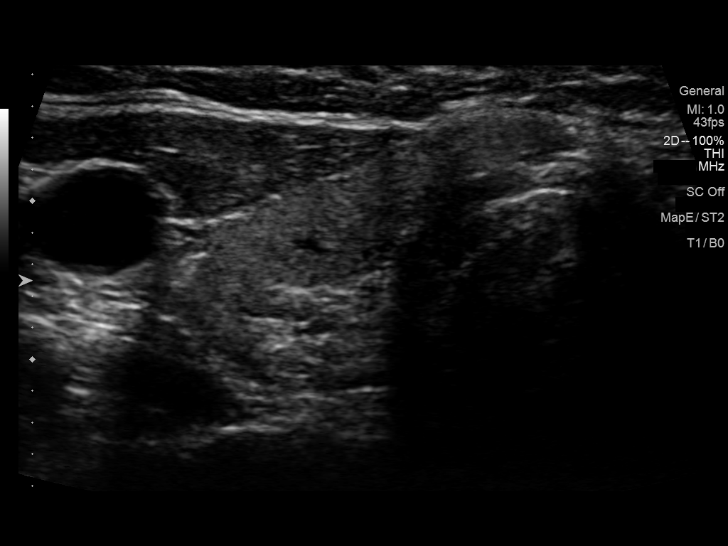
[im 17/37]
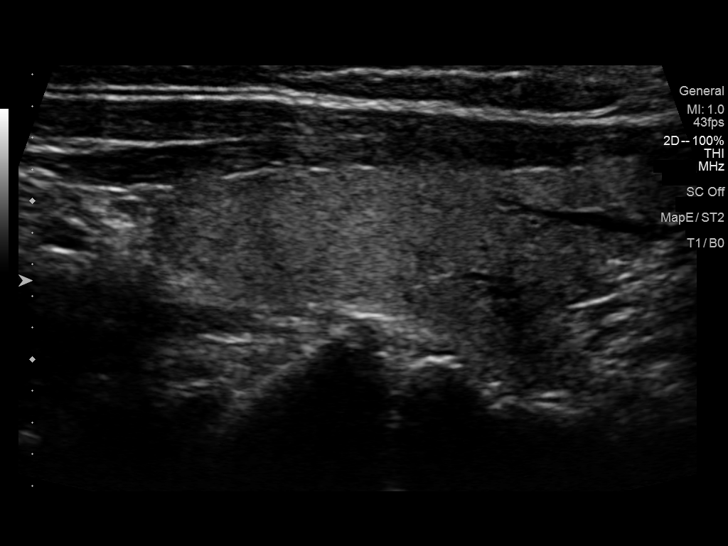
[im 20/37]
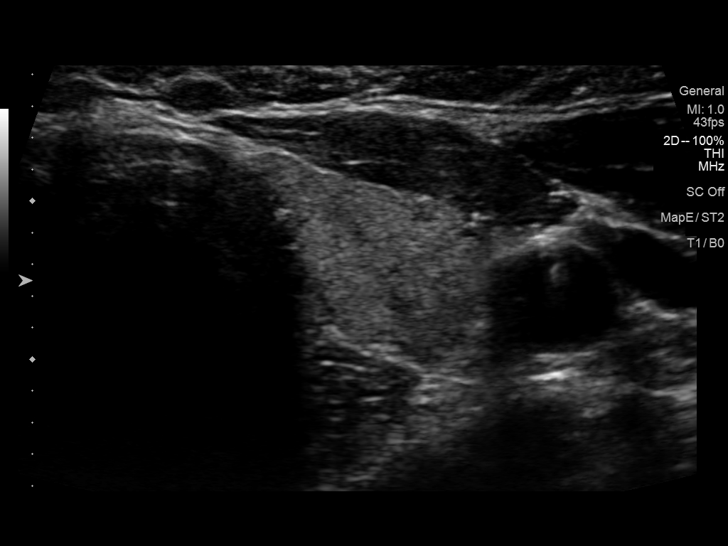
[im 23/37]
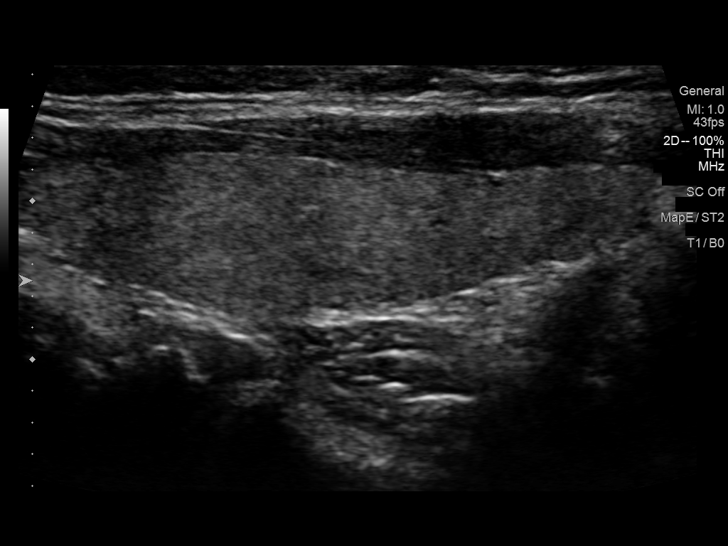
[im 25/37]
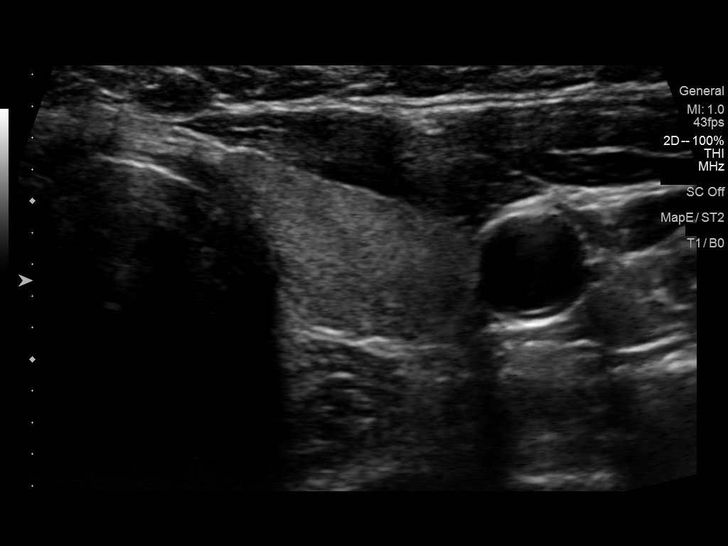
[im 28/37]
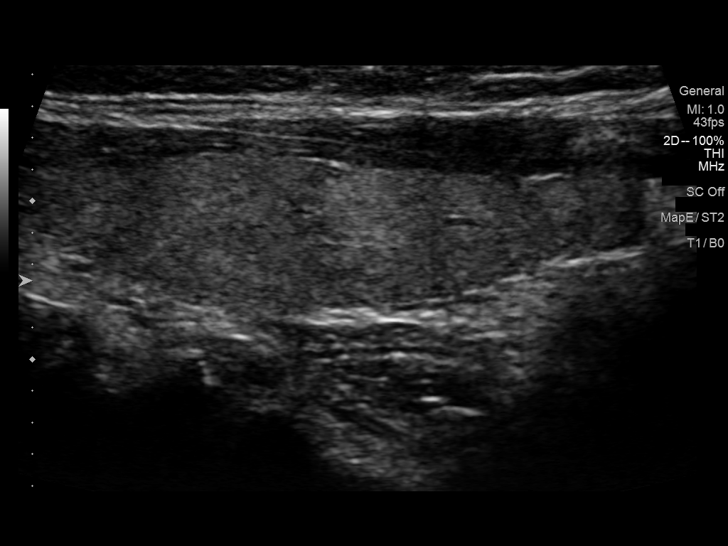
[im 31/37]
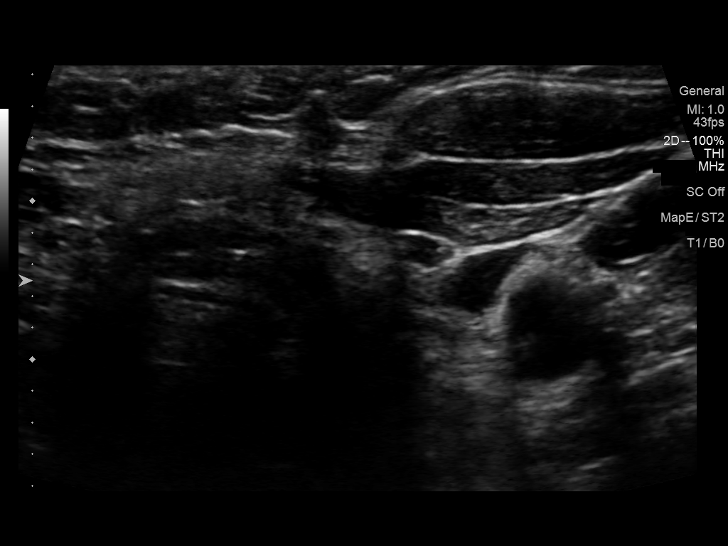
[im 34/37]
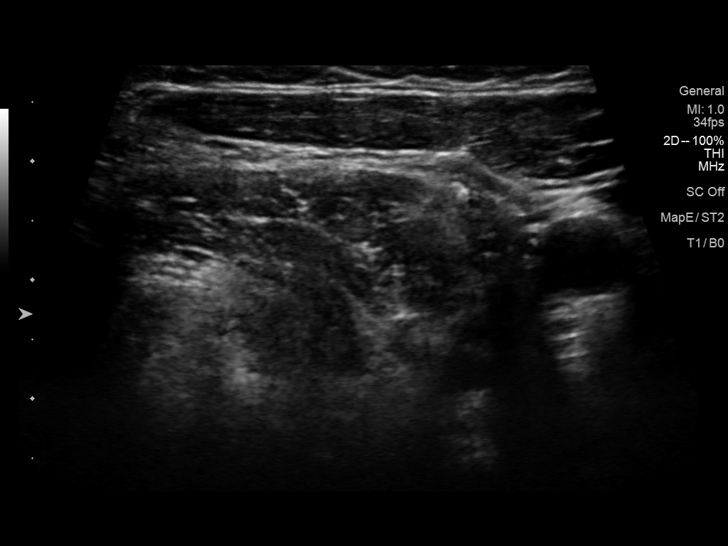
[im 37/37]
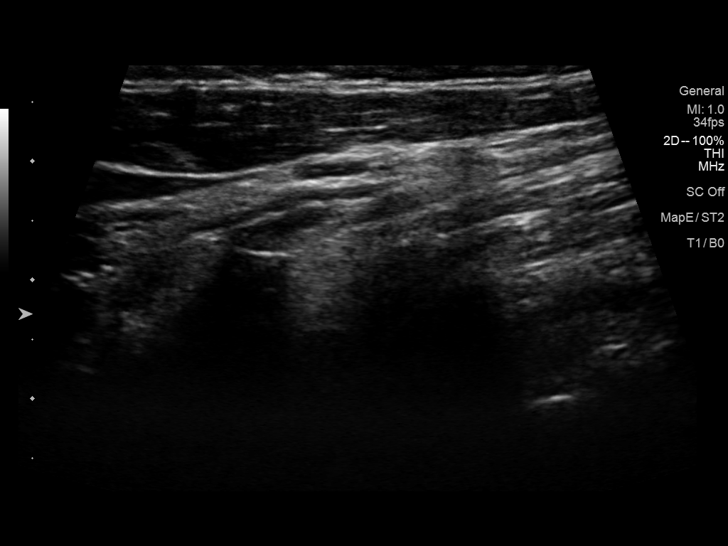

[14 of 25 positions shown; findings below may reference images not displayed]

FINDINGS: Parenchymal Echotexture: Mildly heterogenous

Isthmus: 4 mm

Right lobe: 3.4 x 1.6 x 1.4 cm

Left lobe: 4.1 x 1.1 x 1.2 cm

_________________________________________________________

Estimated total number of nodules >/= 1 cm: 0

Number of spongiform nodules >/=  2 cm not described below (TR1): 0

Number of mixed cystic and solid nodules >/= 1.5 cm not described
below (TR2): 0

_________________________________________________________

Minor gland heterogeneity. No discrete nodule or focal abnormality.
Normal vascularity. No regional adenopathy.
IMPRESSION: Minor nonspecific thyroid heterogeneity. No discrete nodule or
significant finding by ultrasound.

The above is in keeping with the ACR TI-RADS recommendations - [HOSPITAL] 6497;[DATE].

## 2019-12-08 NOTE — Telephone Encounter (Signed)
Received requested records from [facility] 

## 2019-12-10 ENCOUNTER — Encounter: Payer: Self-pay | Admitting: Internal Medicine

## 2019-12-16 ENCOUNTER — Encounter: Payer: Self-pay | Admitting: Family Medicine

## 2020-01-31 DIAGNOSIS — Z01419 Encounter for gynecological examination (general) (routine) without abnormal findings: Secondary | ICD-10-CM | POA: Diagnosis not present

## 2020-01-31 DIAGNOSIS — Z1231 Encounter for screening mammogram for malignant neoplasm of breast: Secondary | ICD-10-CM | POA: Diagnosis not present

## 2020-01-31 DIAGNOSIS — Z6825 Body mass index (BMI) 25.0-25.9, adult: Secondary | ICD-10-CM | POA: Diagnosis not present

## 2020-01-31 LAB — HM MAMMOGRAPHY

## 2020-02-08 ENCOUNTER — Encounter: Payer: Self-pay | Admitting: Internal Medicine

## 2020-05-03 ENCOUNTER — Other Ambulatory Visit: Payer: Self-pay | Admitting: Family Medicine

## 2020-05-03 ENCOUNTER — Telehealth: Payer: Self-pay

## 2020-05-03 DIAGNOSIS — L700 Acne vulgaris: Secondary | ICD-10-CM

## 2020-05-03 NOTE — Telephone Encounter (Signed)
Pt. Called LM stating she does need a refill on her montelukast and her minocycline also she is going to be traveling out of the country soon. Pt. Last apt. Was 11/18/19.

## 2020-05-03 NOTE — Telephone Encounter (Signed)
Pt was notified that singular was filled but minocycline needed to come from dermatology

## 2020-05-03 NOTE — Telephone Encounter (Signed)
Let's have her dermatologist refill the acne medication if they are agreeable. Ok to refill montelukast.

## 2020-05-03 NOTE — Telephone Encounter (Signed)
Ok to refill acne med

## 2020-05-03 NOTE — Telephone Encounter (Signed)
Left message for pt to call me back 

## 2020-05-24 DIAGNOSIS — N898 Other specified noninflammatory disorders of vagina: Secondary | ICD-10-CM | POA: Diagnosis not present

## 2020-05-24 DIAGNOSIS — B373 Candidiasis of vulva and vagina: Secondary | ICD-10-CM | POA: Diagnosis not present

## 2020-05-25 ENCOUNTER — Other Ambulatory Visit: Payer: Self-pay | Admitting: Family Medicine

## 2020-05-25 DIAGNOSIS — E039 Hypothyroidism, unspecified: Secondary | ICD-10-CM

## 2020-06-09 DIAGNOSIS — Z20822 Contact with and (suspected) exposure to covid-19: Secondary | ICD-10-CM | POA: Diagnosis not present

## 2020-08-08 ENCOUNTER — Ambulatory Visit (INDEPENDENT_AMBULATORY_CARE_PROVIDER_SITE_OTHER): Payer: BC Managed Care – PPO | Admitting: Medical

## 2020-08-08 ENCOUNTER — Encounter: Payer: Self-pay | Admitting: Medical

## 2020-08-08 ENCOUNTER — Other Ambulatory Visit: Payer: Self-pay

## 2020-08-08 VITALS — BP 130/82 | HR 63 | Temp 97.4°F | Resp 16 | Wt 155.8 lb

## 2020-08-08 DIAGNOSIS — R5383 Other fatigue: Secondary | ICD-10-CM

## 2020-08-08 DIAGNOSIS — R059 Cough, unspecified: Secondary | ICD-10-CM | POA: Diagnosis not present

## 2020-08-08 DIAGNOSIS — Z8616 Personal history of COVID-19: Secondary | ICD-10-CM | POA: Insufficient documentation

## 2020-08-08 DIAGNOSIS — R1319 Other dysphagia: Secondary | ICD-10-CM | POA: Insufficient documentation

## 2020-08-08 DIAGNOSIS — L700 Acne vulgaris: Secondary | ICD-10-CM

## 2020-08-08 DIAGNOSIS — R252 Cramp and spasm: Secondary | ICD-10-CM | POA: Diagnosis not present

## 2020-08-08 DIAGNOSIS — Z8249 Family history of ischemic heart disease and other diseases of the circulatory system: Secondary | ICD-10-CM | POA: Insufficient documentation

## 2020-08-08 DIAGNOSIS — R002 Palpitations: Secondary | ICD-10-CM | POA: Insufficient documentation

## 2020-08-08 DIAGNOSIS — R0602 Shortness of breath: Secondary | ICD-10-CM | POA: Insufficient documentation

## 2020-08-08 DIAGNOSIS — L709 Acne, unspecified: Secondary | ICD-10-CM | POA: Insufficient documentation

## 2020-08-08 HISTORY — DX: Family history of ischemic heart disease and other diseases of the circulatory system: Z82.49

## 2020-08-08 HISTORY — DX: Personal history of COVID-19: Z86.16

## 2020-08-08 HISTORY — DX: Cough, unspecified: R05.9

## 2020-08-08 MED ORDER — MINOCYCLINE HCL 100 MG PO CAPS: 100.0000 mg | ORAL_CAPSULE | Freq: Every day | ORAL | 0 refills | Status: DC

## 2020-08-08 MED ORDER — MOMETASONE FURO-FORMOTEROL FUM 100-5 MCG/ACT IN AERO: 2.0000 | INHALATION_SPRAY | Freq: Two times a day (BID) | RESPIRATORY_TRACT | 1 refills | Status: DC

## 2020-08-08 NOTE — Progress Notes (Signed)
Subjective:  Stacey Gardner is a 55 y.o. female who presents for Chief Complaint  Patient presents with   f/u    Follow-up on covid from may/June, wants to have lungs checked. Alittel cough still, drinking water in am sometimes will have pain down throat and will throw it back up. Still having headaches, very tired     Here for covid f/u.  Accompanied by husband.  She notes a month ago had covid while in [place].   Was told to do a follow up when she got [place] the [place].   Initially had bad congestion, no fever, coughed a lot, headache, palpations, SOB, fatigue, initially worse about 2 weeks, then got some improvement.   Treatment initially was given a once daily medication x 4 days to help "open the lungs/breath better"   had this prescribed twice.  Was also given cough syrup and ibuprofen.  Was not given monoclonal antibody, no inhaler, no antivirals.  Was not hospitalized.   She notes have some headaches, fatigue, tightness in chest intermittent, some cough, some throat discomfort.  Sometimes gets a discomfort in chest with drinking water.    Sometimes uses Stacey Gardner OTC for ache in right arm, sometimes uses OTC headache medications, but otherwise using no particular medication currently for symptoms.    In general she has underlying chronic rhinitis and chest congestion.  Has inhaler but not having to use this.  Is on singular which has been really helping for chronic therapy.  She notes father has hx/o heart attack and heart problems.  He was diagnosed in his 26s.  Also needs refill on ance medication, Stacey Gardner that she uses regularly for maintenance/prevention for acne  No other aggravating or relieving factors.    No other Gardner/o.  Past Medical History:  Diagnosis Date   Allergy    Anemia    Depression    Hypothyroid    Mixed hyperlipidemia 03/04/2016   Stacey D deficiency     Current Outpatient Medications on File Prior to Visit  Medication Sig Dispense Refill    acetaminophen (Stacey Gardner) 650 MG CR tablet Take 1,300 mg by mouth as needed for pain.      Ascorbic Acid (Stacey Gardner) 500 MG CHEW Chew by mouth.     Biotin 5 MG CAPS Take by mouth.     calcium carbonate (TITRALAC) 420 MG CHEW chewable tablet Chew 420 mg by mouth.     levothyroxine (Stacey Gardner) 88 MCG tablet TAKE ONE TABLET BY MOUTH EVERY MORNING BEFORE BREAKFAST 90 tablet 0   montelukast (Stacey Gardner) 10 MG tablet TAKE ONE TABLET BY MOUTH AT BEDTIME 30 tablet 1   Omega-3 Fatty Acids (Stacey Gardner) 1200 MG CAPS Take by mouth.     pyridoxine (B-6) 100 MG tablet Take 100 mg by mouth daily.     Stacey E 200 UNIT capsule Take 200 Units by mouth daily.     albuterol (Stacey Gardner HFA) 108 (90 Base) MCG/ACT inhaler Inhale 2 puffs into the lungs every 6 (six) hours as needed for wheezing or shortness of breath. (Patient not taking: Reported on 08/08/2020) 18 g 0   Cholecalciferol (Stacey Gardner PO) Take by mouth. (Patient not taking: Reported on 08/08/2020)     Stacey Gardner (Stacey Gardner) 100 MG capsule Take 1 capsule (100 mg total) by mouth daily. (Patient not taking: Reported on 08/08/2020) 30 capsule 0   No current facility-administered medications on file prior to visit.     The following portions of the patient's history were reviewed and  updated as appropriate: allergies, current medications, past family history, past medical history, past social history, past surgical history and problem list.  ROS Otherwise as in subjective above  Objective: BP 130/82   Pulse 63   Temp (!) 97.4 F (36.3 Gardner)   Resp 16   Wt 155 lb 12.8 oz (70.7 kg)   SpO2 97%   BMI 24.40 kg/m   General appearance: alert, no distress, well developed, well nourished HEENT: normocephalic, sclerae anicteric, conjunctiva pink and moist, TMs pearly, nares patent, no discharge or erythema, pharynx normal Oral cavity: MMM, no lesions Neck: supple, no lymphadenopathy, no thyromegaly, no masses, normal ROM Heart: RRR, normal S1, S2, no murmurs Lungs:  decreased breath sounds, otherwise, no wheezes, rhonchi, or rales Abdomen: +bs, soft, mild mid abdominal tenderness, otherwise non tender, non distended, no masses, no hepatomegaly, no splenomegaly Pulses: 2+ radial pulses, 2+ pedal pulses, normal cap refill Ext: no edema    Adult ECG Report  Indication: palpitation, recent covid infection  Rate: 60 bpm  Rhythm: normal sinus rhythm  QRS Axis: 66 degrees  PR Interval:  QRS Duration: 56ms  QTc:  Conduction Disturbances:  incomplete RBBB  Other Abnormalities:  possible left atrial enlargement  Patient's cardiac risk factors are: none.  EKG comparison: none  Narrative Interpretation: possible left atrial enlargement, incomplete RBBB  PFT reviewed    Assessment: Encounter Diagnoses  Name Primary?   SOB (shortness of breath) Yes   History of COVID-19    Cough    Muscle cramp    Fatigue, unspecified type    Other dysphagia    Palpitation    Family history of premature CAD    Acne, unspecified acne type      Plan: We discussed her symptoms and concerns.  We discussed potential evaluation for her variety of symptoms  PFT was abnormal.  We will begin trial of Stacey Gardner.  She already has albuterol she can use as needed.  We discussed proper use of Stacey Gardner.  Plan to use Stacey Gardner the next 2 to 3 weeks  We discussed EKG findings which are likely nothing new and I doubt any cardiac issues of concern with the recent COVID infection.  Most of her symptoms sound related more to breathing function and overall fatigue and inflammation we see with COVID  Labs today given cramps, fatigue, symptoms.  Go for chest x-ray as discussed  Difficulty swallowing-chest x-ray to evaluate right for hiatal hernia.  Likely may need to go back to [facility] gastro[facility]ology for further evaluation or have a swallow study  No recent palpitations.  She had this early in COVID  We discussed that typically people with COVID can have fatigue  lasting for weeks, lingering cough for weeks and breathing takes a while to get back to baseline.  Acne-refill Stacey Gardner but can discuss other options going forward at next visit   [name] was seen today for f/u.  Diagnoses and all orders for this visit:  SOB (shortness of breath) -     DG Chest 2 View; Future -     Spirometry with graph  History of COVID-19 -     Basic metabolic panel -     CBC with Differential/Platelet -     TSH -     DG Chest 2 View; Future -     Pulse oximetry (single); Future -     EKG 12-Lead -     Spirometry with graph  Cough -     DG  Chest 2 View; Future -     Spirometry with graph  Muscle cramp  Fatigue, unspecified type -     Basic metabolic panel -     CBC with Differential/Platelet -     TSH -     DG Chest 2 View; Future -     Pulse oximetry (single); Future -     EKG 12-Lead  Other dysphagia  Palpitation -     Basic metabolic panel -     CBC with Differential/Platelet -     EKG 12-Lead  Family history of premature CAD  Acne, unspecified acne type  Other orders -     mometasone-formoterol (DULERA) 100-5 MCG/ACT AERO; Inhale 2 puffs into the lungs 2 (two) times daily.   Follow up: pending labs, xray

## 2020-08-08 NOTE — Patient Instructions (Signed)
Please go to [facility] for your chest xray.   Their hours are 8am - 4:30 pm Monday - Friday.  Take your insurance card with you. ° °[facility] °[phone] ° °[address], [address] °[place], [place] [address] ° °[address] °[place], [place] [address] ° ° °

## 2020-08-09 ENCOUNTER — Other Ambulatory Visit: Payer: Self-pay | Admitting: Medical

## 2020-08-09 ENCOUNTER — Ambulatory Visit
Admission: RE | Admit: 2020-08-09 | Discharge: 2020-08-09 | Disposition: A | Discharge status: Home / Self Care | Payer: BC Managed Care – PPO | Source: Ambulatory Visit | Attending: Medical | Admitting: Medical

## 2020-08-09 ENCOUNTER — Telehealth: Payer: Self-pay

## 2020-08-09 ENCOUNTER — Other Ambulatory Visit: Payer: Self-pay | Admitting: Internal Medicine

## 2020-08-09 ENCOUNTER — Telehealth: Payer: Self-pay | Admitting: Family Medicine

## 2020-08-09 DIAGNOSIS — Z8616 Personal history of COVID-19: Secondary | ICD-10-CM

## 2020-08-09 DIAGNOSIS — R0602 Shortness of breath: Secondary | ICD-10-CM | POA: Diagnosis not present

## 2020-08-09 DIAGNOSIS — R1319 Other dysphagia: Secondary | ICD-10-CM

## 2020-08-09 DIAGNOSIS — R059 Cough, unspecified: Secondary | ICD-10-CM | POA: Diagnosis not present

## 2020-08-09 DIAGNOSIS — R5383 Other fatigue: Secondary | ICD-10-CM

## 2020-08-09 LAB — CBC WITH DIFFERENTIAL/PLATELET
Basophils Absolute: 0 10*3/uL (ref 0.0–0.2)
Basos: 1 %
EOS (ABSOLUTE): 0.1 10*3/uL (ref 0.0–0.4)
Eos: 1 %
Hematocrit: 41.5 % (ref 34.0–46.6)
Hemoglobin: 13.8 g/dL (ref 11.1–15.9)
Immature Grans (Abs): 0 10*3/uL (ref 0.0–0.1)
Immature Granulocytes: 0 %
Lymphocytes Absolute: 2.5 10*3/uL (ref 0.7–3.1)
Lymphs: 33 %
MCH: 29.8 pg (ref 26.6–33.0)
MCHC: 33.3 g/dL (ref 31.5–35.7)
MCV: 90 fL (ref 79–97)
Monocytes Absolute: 0.6 10*3/uL (ref 0.1–0.9)
Monocytes: 8 %
Neutrophils Absolute: 4.4 10*3/uL (ref 1.4–7.0)
Neutrophils: 57 %
Platelets: 311 10*3/uL (ref 150–450)
RBC: 4.63 x10E6/uL (ref 3.77–5.28)
RDW: 13 % (ref 11.7–15.4)
WBC: 7.6 10*3/uL (ref 3.4–10.8)

## 2020-08-09 LAB — BASIC METABOLIC PANEL
BUN/Creatinine Ratio: 19 (ref 9–23)
BUN: 21 mg/dL (ref 6–24)
CO2: 25 mmol/L (ref 20–29)
Calcium: 10.3 mg/dL — ABNORMAL HIGH (ref 8.7–10.2)
Chloride: 101 mmol/L (ref 96–106)
Creatinine, Ser: 1.12 mg/dL — ABNORMAL HIGH (ref 0.57–1.00)
Glucose: 79 mg/dL (ref 65–99)
Potassium: 4.8 mmol/L (ref 3.5–5.2)
Sodium: 141 mmol/L (ref 134–144)
eGFR: 58 mL/min/{1.73_m2} — ABNORMAL LOW (ref >59)

## 2020-08-09 LAB — TSH: TSH: 0.908 u[IU]/mL (ref 0.450–4.500)

## 2020-08-09 MED ORDER — ADVAIR HFA 115-21 MCG/ACT IN AERO: 2.0000 | INHALATION_SPRAY | Freq: Two times a day (BID) | RESPIRATORY_TRACT | 1 refills | Status: DC

## 2020-08-09 MED ORDER — BREZTRI AEROSPHERE 160-9-4.8 MCG/ACT IN AERO: 2.0000 | INHALATION_SPRAY | Freq: Two times a day (BID) | RESPIRATORY_TRACT | 1 refills | Status: DC

## 2020-08-09 MED ORDER — QVAR REDIHALER 80 MCG/ACT IN AERB: 2.0000 | INHALATION_SPRAY | Freq: Two times a day (BID) | RESPIRATORY_TRACT | 1 refills | Status: DC

## 2020-08-09 MED ORDER — BUDESONIDE-FORMOTEROL FUMARATE 160-4.5 MCG/ACT IN AERO: 2.0000 | INHALATION_SPRAY | Freq: Two times a day (BID) | RESPIRATORY_TRACT | 1 refills | Status: DC

## 2020-08-09 MED ORDER — FLUTICASONE FUROATE-VILANTEROL 100-25 MCG/INH IN AEPB: 1.0000 | INHALATION_SPRAY | Freq: Every day | RESPIRATORY_TRACT | 1 refills | Status: DC

## 2020-08-09 NOTE — Telephone Encounter (Signed)
Received fax from [facility] Budedonide-formoterol (symbicort) rx is too high for patient  If meds you listed in notes are to be filled, please submit new rx for written product

## 2020-08-09 NOTE — Telephone Encounter (Signed)
Recv'd fax from [facility] pharmacy states [place] too expensive, please send in cheaper product

## 2020-08-10 ENCOUNTER — Telehealth: Payer: Self-pay

## 2020-08-10 NOTE — Telephone Encounter (Signed)
Called pharmacy and they states the Stacey Gardner would be the least expensive, they said they already had a prescription for it.  I activated discount card and it wouldn't let me print it out only email it to patient, this was done and called pt and left message for her to take the card to pharmacy

## 2020-08-10 NOTE — Telephone Encounter (Signed)
Faxed copy of discount card to pharmacy and emailed copy the pt's husband at her request

## 2020-08-19 ENCOUNTER — Other Ambulatory Visit: Payer: Self-pay | Admitting: Family Medicine

## 2020-08-19 DIAGNOSIS — E039 Hypothyroidism, unspecified: Secondary | ICD-10-CM

## 2020-11-20 ENCOUNTER — Other Ambulatory Visit: Payer: Self-pay

## 2020-11-20 DIAGNOSIS — E039 Hypothyroidism, unspecified: Secondary | ICD-10-CM

## 2020-11-20 MED ORDER — LEVOTHYROXINE SODIUM 88 MCG PO TABS: 88.0000 ug | ORAL_TABLET | Freq: Every day | ORAL | 0 refills | Status: DC

## 2021-01-03 ENCOUNTER — Encounter: Payer: BC Managed Care – PPO | Admitting: Family Medicine

## 2021-02-15 ENCOUNTER — Other Ambulatory Visit: Payer: Self-pay | Admitting: Medical

## 2021-02-15 DIAGNOSIS — E039 Hypothyroidism, unspecified: Secondary | ICD-10-CM

## 2021-03-12 ENCOUNTER — Encounter: Payer: Self-pay | Admitting: Physician Assistant

## 2021-03-12 ENCOUNTER — Other Ambulatory Visit: Payer: Self-pay

## 2021-03-12 ENCOUNTER — Ambulatory Visit (INDEPENDENT_AMBULATORY_CARE_PROVIDER_SITE_OTHER): Payer: Managed Care, Other (non HMO) | Admitting: Physician Assistant

## 2021-03-12 VITALS — BP 120/82 | HR 67 | Ht 65.0 in | Wt 159.0 lb

## 2021-03-12 DIAGNOSIS — Z803 Family history of malignant neoplasm of breast: Secondary | ICD-10-CM

## 2021-03-12 DIAGNOSIS — E039 Hypothyroidism, unspecified: Secondary | ICD-10-CM | POA: Diagnosis not present

## 2021-03-12 DIAGNOSIS — E782 Mixed hyperlipidemia: Secondary | ICD-10-CM | POA: Diagnosis not present

## 2021-03-12 DIAGNOSIS — Z0000 Encounter for general adult medical examination without abnormal findings: Secondary | ICD-10-CM

## 2021-03-12 DIAGNOSIS — E559 Vitamin D deficiency, unspecified: Secondary | ICD-10-CM | POA: Diagnosis not present

## 2021-03-12 DIAGNOSIS — J3089 Other allergic rhinitis: Secondary | ICD-10-CM

## 2021-03-12 DIAGNOSIS — Z23 Encounter for immunization: Secondary | ICD-10-CM | POA: Diagnosis not present

## 2021-03-12 DIAGNOSIS — R799 Abnormal finding of blood chemistry, unspecified: Secondary | ICD-10-CM

## 2021-03-12 DIAGNOSIS — Z833 Family history of diabetes mellitus: Secondary | ICD-10-CM | POA: Insufficient documentation

## 2021-03-12 DIAGNOSIS — Z1283 Encounter for screening for malignant neoplasm of skin: Secondary | ICD-10-CM

## 2021-03-12 HISTORY — DX: Family history of diabetes mellitus: Z83.3

## 2021-03-12 LAB — LIPID PANEL

## 2021-03-12 MED ORDER — LEVOTHYROXINE SODIUM 88 MCG PO TABS: 88.0000 ug | ORAL_TABLET | Freq: Every day | ORAL | 1 refills | Status: DC

## 2021-03-12 NOTE — Progress Notes (Signed)
Established Patient Office Visit  Subjective:  Patient ID: Stacey Gardner, female    DOB: [birth year]  Age: 56 y.o. MRN: [id number]  CC:  Chief Complaint  Patient presents with   Annual Exam    fasting    HPI Stacey Gardner presents for an annual exam. Reports that she walks for exercise and eats a healthy diet.  Past Medical History:  Diagnosis Date   Allergic rhinitis, cause unspecified 06/24/2013   Allergy    Anemia    Depression    Hypothyroid    Mixed hyperlipidemia 03/04/2016   Vitamin D deficiency     Past Surgical History:  Procedure Laterality Date   CERVICAL POLYPECTOMY      Family History  Problem Relation Age of Onset   Cancer Mother 93       breast   AAA (abdominal aortic aneurysm) Mother    Heart disease Father     Social History   Socioeconomic History   Marital status: Married    Spouse name: Not on file   Number of children: 0   Years of education: 5 years of college in fine arts, received degree (unsure what level)   Highest education level: Not on file  Occupational History   Not on file  Tobacco Use   Smoking status: Never   Smokeless tobacco: Never  Substance and Sexual Activity   Alcohol use: No    Alcohol/week: 0.0 standard drinks   Drug use: No   Sexual activity: Not on file  Other Topics Concern   Not on file  Social History Narrative   Married. Education: Other      Lives at home with her husband. She takes care of him and he is wheelchair bound.   Right handed   Social Determinants of Health   Financial Resource Strain: Not on file  Food Insecurity: Not on file  Transportation Needs: Not on file  Physical Activity: Not on file  Stress: Not on file  Social Connections: Not on file  Intimate Partner Violence: Not on file    Outpatient Medications Prior to Visit  Medication Sig Dispense Refill   Ascorbic Acid (VITAMIN C) 500 MG CHEW Chew by mouth.     Biotin 5 MG CAPS Take by mouth.     Cholecalciferol  (VITAMIN D3 PO) Take by mouth.     pyridoxine (B-6) 100 MG tablet Take 100 mg by mouth daily.     levothyroxine (SYNTHROID) 88 MCG tablet TAKE ONE TABLET BY MOUTH DAILY WITH BREAKFAST 90 tablet 0   acetaminophen (TYLENOL) 650 MG CR tablet Take 1,300 mg by mouth as needed for pain.      albuterol (VENTOLIN HFA) 108 (90 Base) MCG/ACT inhaler Inhale 2 puffs into the lungs every 6 (six) hours as needed for wheezing or shortness of breath. (Patient not taking: Reported on 03/12/2021) 18 g 0   Budeson-Glycopyrrol-Formoterol (BREZTRI AEROSPHERE) 160-9-4.8 MCG/ACT AERO Inhale 2 puffs into the lungs in the morning and at bedtime. (Patient not taking: Reported on 03/12/2021) 10.7 g 1   minocycline (MINOCIN) 100 MG capsule Take 1 capsule (100 mg total) by mouth daily. (Patient not taking: Reported on 03/12/2021) 90 capsule 0   montelukast (SINGULAIR) 10 MG tablet TAKE ONE TABLET BY MOUTH AT BEDTIME (Patient not taking: Reported on 03/12/2021) 30 tablet 1   Omega-3 Fatty Acids (FISH OIL) 1200 MG CAPS Take by mouth. (Patient not taking: Reported on 03/12/2021)     vitamin E 200 UNIT capsule Take 200  Units by mouth daily. (Patient not taking: Reported on 03/12/2021)     beclomethasone (QVAR REDIHALER) 80 MCG/ACT inhaler Inhale 2 puffs into the lungs 2 (two) times daily. 1 each 1   calcium carbonate (TITRALAC) 420 MG CHEW chewable tablet Chew 420 mg by mouth.     fluticasone furoate-vilanterol (BREO ELLIPTA) 100-25 MCG/INH AEPB Inhale 1 puff into the lungs daily. 28 each 1   fluticasone-salmeterol (ADVAIR HFA) 115-21 MCG/ACT inhaler Inhale 2 puffs into the lungs 2 (two) times daily. 1 each 1   No facility-administered medications prior to visit.    Allergies  Allergen Reactions   Codeine Nausea Only   Cough Syrup [Guaifenesin]     Has to have cough syrup without codeine   Cortisone Rash    cream   Neosporin [Neomycin-Polymyxin-Gramicidin] Rash    ROS Review of Systems  Constitutional:  Negative for activity  change and chills.  HENT:  Negative for congestion and voice change.   Eyes:  Negative for pain and redness.  Respiratory:  Negative for cough and wheezing.   Cardiovascular:  Negative for chest pain.  Gastrointestinal:  Negative for constipation, diarrhea, nausea and vomiting.  Endocrine: Negative for polyuria.  Genitourinary:  Negative for frequency.  Skin:  Negative for color change and rash.  Allergic/Immunologic: Negative for immunocompromised state.  Neurological:  Negative for dizziness.  Psychiatric/Behavioral:  Negative for agitation.      Objective:    Physical Exam Constitutional:      General: She is not in acute distress.    Appearance: Normal appearance.  HENT:     Head: Normocephalic and atraumatic.     Right Ear: Tympanic membrane, ear canal and external ear normal.     Left Ear: Tympanic membrane, ear canal and external ear normal.  Eyes:     Conjunctiva/sclera: Conjunctivae normal.     Pupils: Pupils are equal, round, and reactive to light.  Neck:     Vascular: No carotid bruit.  Cardiovascular:     Rate and Rhythm: Normal rate and regular rhythm.     Pulses: Normal pulses.     Heart sounds: Normal heart sounds.  Pulmonary:     Effort: Pulmonary effort is normal. No respiratory distress.     Breath sounds: Normal breath sounds. No wheezing.  Abdominal:     General: Bowel sounds are normal.     Palpations: Abdomen is soft.  Musculoskeletal:        General: Normal range of motion.     Cervical back: Normal range of motion and neck supple.     Right lower leg: No edema.     Left lower leg: No edema.  Skin:    General: Skin is warm and dry.     Findings: No rash.  Neurological:     General: No focal deficit present.     Mental Status: She is alert and oriented to person, place, and time.     Gait: Gait normal.  Psychiatric:        Mood and Affect: Mood normal.        Behavior: Behavior normal.    BP 120/82 (BP Location: Right Arm, Patient  Position: Sitting)    Pulse 67    Ht _0  (1.651 m)    Wt 159 lb (72.1 kg)    SpO2 97%    BMI 26.46 kg/m  Wt Readings from Last 3 Encounters:  03/12/21 159 lb (72.1 kg)  08/08/20 155 lb 12.8 oz (70.7 kg)  11/18/19 150 lb 12.8 oz (68.4 kg)     There are no preventive care reminders to display for this patient.   There are no preventive care reminders to display for this patient.  Lab Results  Component Value Date   TSH 1.860 03/12/2021   Lab Results  Component Value Date   WBC 5.6 03/12/2021   HGB 14.1 03/12/2021   HCT 41.3 03/12/2021   MCV 89 03/12/2021   PLT 295 03/12/2021   Lab Results  Component Value Date   NA 140 03/12/2021   K 4.4 03/12/2021   CO2 24 03/12/2021   GLUCOSE 76 03/12/2021   BUN 16 03/12/2021   CREATININE 1.00 03/12/2021   BILITOT 0.4 03/12/2021   ALKPHOS 154 (H) 03/12/2021   AST 35 03/12/2021   ALT 22 03/12/2021   PROT 7.5 03/12/2021   ALBUMIN 5.0 (H) 03/12/2021   CALCIUM 10.1 03/12/2021   EGFR 67 03/12/2021   Lab Results  Component Value Date   CHOL 229 (H) 03/12/2021   Lab Results  Component Value Date   HDL 60 03/12/2021   Lab Results  Component Value Date   LDLCALC 150 (H) 03/12/2021   Lab Results  Component Value Date   TRIG 107 03/12/2021   Lab Results  Component Value Date   CHOLHDL 3.8 03/12/2021   Lab Results  Component Value Date   HGBA1C 5.4 03/12/2021      Assessment & Plan:   Problem List Items Addressed This Visit       Endocrine   Hypothyroidism   Relevant Medications   levothyroxine (SYNTHROID) 88 MCG tablet     Other   Vitamin D deficiency   Mixed hyperlipidemia   Environmental and seasonal allergies   Abnormal blood chemistry   Relevant Orders   Hemoglobin A1c (Completed)   Family history of diabetes mellitus   Family history of breast cancer in first degree relative   Other Visit Diagnoses     Routine medical exam    -  Primary   Relevant Orders   CBC with Differential/Platelet  (Completed)   Comprehensive metabolic panel (Completed)   Lipid panel (Completed)   TSH + free T4 (Completed)   Needs flu shot       Relevant Orders   Flu Vaccine QUAD 61moIM (Fluarix, Fluzone & Alfiuria Quad PF) (Completed)   Screening exam for skin cancer       Relevant Orders   Ambulatory referral to Dermatology       Meds ordered this encounter  Medications   levothyroxine (SYNTHROID) 88 MCG tablet    Sig: Take 1 tablet (88 mcg total) by mouth daily with breakfast.    Dispense:  90 tablet    Refill:  1    Order Specific Question:   Supervising Provider    Answer:   [facility][[[id number]   Follow-up: Return in about 1 year (around 03/12/2022) for Return for annual exam 45 minutes.   Alk Phos elevated - check at next office visit  LIrene Pap PA-C

## 2021-03-13 LAB — COMPREHENSIVE METABOLIC PANEL
ALT: 22 IU/L (ref 0–32)
AST: 35 IU/L (ref 0–40)
Albumin/Globulin Ratio: 2 (ref 1.2–2.2)
Albumin: 5 g/dL — ABNORMAL HIGH (ref 3.8–4.9)
Alkaline Phosphatase: 154 IU/L — ABNORMAL HIGH (ref 44–121)
BUN/Creatinine Ratio: 16 (ref 9–23)
BUN: 16 mg/dL (ref 6–24)
Bilirubin Total: 0.4 mg/dL (ref 0.0–1.2)
CO2: 24 mmol/L (ref 20–29)
Calcium: 10.1 mg/dL (ref 8.7–10.2)
Chloride: 100 mmol/L (ref 96–106)
Creatinine, Ser: 1 mg/dL (ref 0.57–1.00)
Globulin, Total: 2.5 g/dL (ref 1.5–4.5)
Glucose: 76 mg/dL (ref 70–99)
Potassium: 4.4 mmol/L (ref 3.5–5.2)
Sodium: 140 mmol/L (ref 134–144)
Total Protein: 7.5 g/dL (ref 6.0–8.5)
eGFR: 67 mL/min/{1.73_m2} (ref >59)

## 2021-03-13 LAB — LIPID PANEL
Chol/HDL Ratio: 3.8 ratio (ref 0.0–4.4)
Cholesterol, Total: 229 mg/dL — ABNORMAL HIGH (ref 100–199)
HDL: 60 mg/dL (ref >39)
LDL Chol Calc (NIH): 150 mg/dL — ABNORMAL HIGH (ref 0–99)
Triglycerides: 107 mg/dL (ref 0–149)
VLDL Cholesterol Cal: 19 mg/dL (ref 5–40)

## 2021-03-13 LAB — CBC WITH DIFFERENTIAL/PLATELET
Basophils Absolute: 0 10*3/uL (ref 0.0–0.2)
Basos: 1 %
EOS (ABSOLUTE): 0.1 10*3/uL (ref 0.0–0.4)
Eos: 1 %
Hematocrit: 41.3 % (ref 34.0–46.6)
Hemoglobin: 14.1 g/dL (ref 11.1–15.9)
Immature Grans (Abs): 0 10*3/uL (ref 0.0–0.1)
Immature Granulocytes: 0 %
Lymphocytes Absolute: 2 10*3/uL (ref 0.7–3.1)
Lymphs: 36 %
MCH: 30.5 pg (ref 26.6–33.0)
MCHC: 34.1 g/dL (ref 31.5–35.7)
MCV: 89 fL (ref 79–97)
Monocytes Absolute: 0.4 10*3/uL (ref 0.1–0.9)
Monocytes: 8 %
Neutrophils Absolute: 3.1 10*3/uL (ref 1.4–7.0)
Neutrophils: 54 %
Platelets: 295 10*3/uL (ref 150–450)
RBC: 4.63 x10E6/uL (ref 3.77–5.28)
RDW: 13.1 % (ref 11.7–15.4)
WBC: 5.6 10*3/uL (ref 3.4–10.8)

## 2021-03-13 LAB — TSH+FREE T4
Free T4: 1.89 ng/dL — ABNORMAL HIGH (ref 0.82–1.77)
TSH: 1.86 u[IU]/mL (ref 0.450–4.500)

## 2021-03-13 LAB — HEMOGLOBIN A1C
Est. average glucose Bld gHb Est-mCnc: 108 mg/dL
Hgb A1c MFr Bld: 5.4 % (ref 4.8–5.6)

## 2021-03-15 ENCOUNTER — Encounter: Payer: Self-pay | Admitting: Physician Assistant

## 2021-04-05 ENCOUNTER — Encounter: Payer: Self-pay | Admitting: Physician Assistant

## 2021-04-06 ENCOUNTER — Ambulatory Visit (INDEPENDENT_AMBULATORY_CARE_PROVIDER_SITE_OTHER): Payer: Managed Care, Other (non HMO) | Admitting: Medical

## 2021-04-06 ENCOUNTER — Other Ambulatory Visit: Payer: Self-pay

## 2021-04-06 ENCOUNTER — Encounter: Payer: Self-pay | Admitting: Medical

## 2021-04-06 VITALS — Wt 155.0 lb

## 2021-04-06 DIAGNOSIS — J988 Other specified respiratory disorders: Secondary | ICD-10-CM

## 2021-04-06 DIAGNOSIS — J4540 Moderate persistent asthma, uncomplicated: Secondary | ICD-10-CM | POA: Diagnosis not present

## 2021-04-06 MED ORDER — BENZONATATE 200 MG PO CAPS: 200.0000 mg | ORAL_CAPSULE | Freq: Three times a day (TID) | ORAL | 0 refills | Status: DC | PRN

## 2021-04-06 NOTE — Patient Instructions (Signed)
Current symptoms suggest viral respiratory tract infection or cold. ? ?Since you have a history of allergies and asthma and we are entering spring allergy season, your allergy symptoms could be also complicating the picture ? ?Recommendations: ?Continue Singulair which she just started recently back for allergies which may also help with congestion ?Consider using over-the-counter Benadryl at night for the next few days to help with congestion as well.  This can also help with the puffiness of the face ?Continue Breztri inhaler daily for maintenance of asthma symptoms ?Continue albuterol inhaler 2 puffs every 4-6 hours for tightness in the chest or any wheezing or shortness of breath ?Hydrate well with clear fluids throughout the day ?Begin Tessalon Perles cough drops prescription as needed for cough ?Continue Tylenol every 4-6 hours for fever, not feeling well, sore throat ?I recommend nasal saline flush and salt water gargles several times a day to help flush out mucus ? ?If much worse over the next few days get reevaluated or if questions, then call back or recheck ?

## 2021-04-06 NOTE — Progress Notes (Signed)
?Subjective:  ?  ? Patient ID: Stacey Gardner, female   DOB: 1965/08/25, 56 y.o.   MRN: [id number] ? ?This visit type was conducted due to national recommendations for restrictions regarding the COVID-19 Pandemic (e.g. social distancing) in an effort to limit this patient's exposure and mitigate transmission in our community.  Due to their co-morbid illnesses, this patient is at least at moderate risk for complications without adequate follow up.  This format is felt to be most appropriate for this patient at this time.   ? ?Documentation for virtual audio and video telecommunications through [facility] encounter: ? ?The patient was located at home. ?The provider was located in the office. ?The patient did consent to this visit and is aware of possible charges through their insurance for this visit. ? ?The other persons participating in this telemedicine service were husband. ?Time spent on call was 20 minutes and in review of previous records 20 minutes total. ? ?This virtual service is not related to other E/M service within previous 7 days. ? ? ?HPI ?Chief Complaint  ?Patient presents with  ? Nasal Congestion  ?  Cough, congestion, x 2 days. Sinus drip, vomitting, fever 99.9 yesterday. Itch in throat.  Took otc meds   ? ?Virtual consult for illness.  Started 3 days ago with throat itching, then next morning fever around 100, lots of cough, congestion in face and chest, sinus drainage.  Lots of coughing.  Vomited 3 times on first day but now no vomiting. ?Today has headache, lots of cough last night.  No body aches, no chills.   No sick contacts.  No recent covid test, but had covid infection last year.   No wheezing, no SOB.  No current nausea.  Nasal congestion is clear.   Chest congestion is clear.  Using cepachol for throat.   Feels face is a little swollen.   She has allergy to butter and recently bought some rice cakes, and wonders if those allergic to this.   Today no fever.  Using tylenol.  No loss of  taste or smell. ? ?Currently on Singulair for allergies x last few days, using Breztri in general.  Not currently using albuterol inhaler.   ? ?No other aggravating or relieving factors. No other complaint. ? ?Past Medical History:  ?Diagnosis Date  ? Allergic rhinitis, cause unspecified 06/24/2013  ? Allergy   ? Anemia   ? Asthma   ? Depression   ? Hypothyroid   ? Mixed hyperlipidemia 03/04/2016  ? Vitamin D deficiency   ? ?Current Outpatient Medications on File Prior to Visit  ?Medication Sig Dispense Refill  ? acetaminophen (TYLENOL) 650 MG CR tablet Take 1,300 mg by mouth as needed for pain.     ? albuterol (VENTOLIN HFA) 108 (90 Base) MCG/ACT inhaler Inhale 2 puffs into the lungs every 6 (six) hours as needed for wheezing or shortness of breath. 18 g 0  ? Ascorbic Acid (VITAMIN C) 500 MG CHEW Chew by mouth.    ? Biotin 5 MG CAPS Take by mouth.    ? Budeson-Glycopyrrol-Formoterol (BREZTRI AEROSPHERE) 160-9-4.8 MCG/ACT AERO Inhale 2 puffs into the lungs in the morning and at bedtime. 10.7 g 1  ? Cholecalciferol (VITAMIN D3 PO) Take by mouth.    ? levothyroxine (SYNTHROID) 88 MCG tablet Take 1 tablet (88 mcg total) by mouth daily with breakfast. 90 tablet 1  ? minocycline (MINOCIN) 100 MG capsule Take 1 capsule (100 mg total) by mouth daily. 90 capsule 0  ?  montelukast (SINGULAIR) 10 MG tablet TAKE ONE TABLET BY MOUTH AT BEDTIME 30 tablet 1  ? Omega-3 Fatty Acids (FISH OIL) 1200 MG CAPS Take by mouth.    ? pyridoxine (B-6) 100 MG tablet Take 100 mg by mouth daily.    ? vitamin E 200 UNIT capsule Take 200 Units by mouth daily.    ? ?No current facility-administered medications on file prior to visit.  ? ? ?Review of Systems ?As in subjective ?   ?Objective:  ? Physical Exam ?Due to coronavirus pandemic stay at home measures, patient visit was virtual and they were not examined in person.   ?Wt 155 lb (70.3 kg)   BMI 25.79 kg/m?  ? ?Gen: wd, wn ,nad ?No labored breathing or wheezing ?Answers questions in complete  sentences ?No significant cough during the interview ? ?   ?Assessment:  ?   ?Encounter Diagnoses  ?Name Primary?  ? Respiratory tract infection Yes  ? Moderate persistent extrinsic asthma without complication   ? ? ?   ?Plan:  ?   ?Current symptoms suggest viral respiratory tract infection or cold. ? ?Since you have a history of allergies and asthma and we are entering spring allergy season, your allergy symptoms could be also complicating the picture ? ?Recommendations: ?Continue Singulair which she just started recently back for allergies which may also help with congestion ?Consider using over-the-counter Benadryl at night for the next few days to help with congestion as well.  This can also help with the puffiness of the face ?Continue Breztri inhaler daily for maintenance of asthma symptoms ?Continue albuterol inhaler 2 puffs every 4-6 hours for tightness in the chest or any wheezing or shortness of breath ?Hydrate well with clear fluids throughout the day ?Begin Tessalon Perles cough drops prescription as needed for cough ?Continue Tylenol every 4-6 hours for fever, not feeling well, sore throat ?I recommend nasal saline flush and salt water gargles several times a day to help flush out mucus ? ?If much worse over the next few days get reevaluated or if questions, then call back or recheck ? ? ?Stacey Gardner was seen today for nasal congestion. ? ?Diagnoses and all orders for this visit: ? ?Respiratory tract infection ? ?Moderate persistent extrinsic asthma without complication ? ?Other orders ?-     benzonatate (TESSALON) 200 MG capsule; Take 1 capsule (200 mg total) by mouth 3 (three) times daily as needed for cough. ? ?F/u prn ? ?

## 2021-04-09 MED ORDER — MONTELUKAST SODIUM 10 MG PO TABS: 10.0000 mg | ORAL_TABLET | Freq: Every day | ORAL | 1 refills | Status: DC

## 2021-04-16 ENCOUNTER — Other Ambulatory Visit: Payer: Self-pay | Admitting: Physician Assistant

## 2021-04-16 DIAGNOSIS — J4540 Moderate persistent asthma, uncomplicated: Secondary | ICD-10-CM

## 2021-04-16 MED ORDER — ALBUTEROL SULFATE HFA 108 (90 BASE) MCG/ACT IN AERS: 2.0000 | INHALATION_SPRAY | Freq: Four times a day (QID) | RESPIRATORY_TRACT | 1 refills | Status: DC | PRN

## 2021-04-18 DIAGNOSIS — M6530 Trigger finger, unspecified finger: Secondary | ICD-10-CM | POA: Insufficient documentation

## 2021-04-18 DIAGNOSIS — B179 Acute viral hepatitis, unspecified: Secondary | ICD-10-CM | POA: Insufficient documentation

## 2021-04-18 DIAGNOSIS — M1990 Unspecified osteoarthritis, unspecified site: Secondary | ICD-10-CM | POA: Insufficient documentation

## 2021-04-26 ENCOUNTER — Other Ambulatory Visit: Payer: Self-pay | Admitting: Physician Assistant

## 2021-04-26 DIAGNOSIS — J3089 Other allergic rhinitis: Secondary | ICD-10-CM

## 2021-04-27 ENCOUNTER — Other Ambulatory Visit: Payer: Self-pay | Admitting: Physician Assistant

## 2021-05-13 ENCOUNTER — Other Ambulatory Visit: Payer: Self-pay | Admitting: Medical

## 2021-05-13 DIAGNOSIS — E039 Hypothyroidism, unspecified: Secondary | ICD-10-CM

## 2021-06-08 ENCOUNTER — Encounter: Payer: Self-pay | Admitting: Physician Assistant

## 2021-08-03 ENCOUNTER — Other Ambulatory Visit: Payer: Self-pay | Admitting: Physician Assistant

## 2021-08-03 ENCOUNTER — Encounter: Payer: Self-pay | Admitting: Internal Medicine

## 2021-10-10 ENCOUNTER — Encounter: Payer: Self-pay | Admitting: Internal Medicine

## 2021-10-10 ENCOUNTER — Other Ambulatory Visit: Payer: Self-pay | Admitting: Physician Assistant

## 2021-10-10 DIAGNOSIS — E039 Hypothyroidism, unspecified: Secondary | ICD-10-CM

## 2021-11-13 ENCOUNTER — Encounter: Payer: Self-pay | Admitting: Internal Medicine

## 2021-12-13 ENCOUNTER — Other Ambulatory Visit: Payer: Self-pay | Admitting: Medical

## 2021-12-13 ENCOUNTER — Telehealth (INDEPENDENT_AMBULATORY_CARE_PROVIDER_SITE_OTHER): Payer: Managed Care, Other (non HMO) | Admitting: Medical

## 2021-12-13 VITALS — Temp 100.4°F

## 2021-12-13 DIAGNOSIS — U071 COVID-19: Secondary | ICD-10-CM | POA: Diagnosis not present

## 2021-12-13 DIAGNOSIS — E039 Hypothyroidism, unspecified: Secondary | ICD-10-CM | POA: Diagnosis not present

## 2021-12-13 DIAGNOSIS — J4540 Moderate persistent asthma, uncomplicated: Secondary | ICD-10-CM

## 2021-12-13 MED ORDER — FLUTICASONE FUROATE-VILANTEROL 200-25 MCG/ACT IN AEPB: 1.0000 | INHALATION_SPRAY | Freq: Every day | RESPIRATORY_TRACT | 2 refills | Status: DC

## 2021-12-13 MED ORDER — EMERGEN-C IMMUNE PLUS PO PACK: 1.0000 | PACK | Freq: Two times a day (BID) | ORAL | 0 refills | Status: DC

## 2021-12-13 MED ORDER — ALBUTEROL SULFATE HFA 108 (90 BASE) MCG/ACT IN AERS: 2.0000 | INHALATION_SPRAY | Freq: Four times a day (QID) | RESPIRATORY_TRACT | 1 refills | Status: DC | PRN

## 2021-12-13 MED ORDER — BREZTRI AEROSPHERE 160-9-4.8 MCG/ACT IN AERO: 2.0000 | INHALATION_SPRAY | Freq: Two times a day (BID) | RESPIRATORY_TRACT | 5 refills | Status: DC

## 2021-12-13 MED ORDER — PROMETHAZINE-DM 6.25-15 MG/5ML PO SYRP: 5.0000 mL | ORAL_SOLUTION | Freq: Four times a day (QID) | ORAL | 0 refills | Status: DC | PRN

## 2021-12-13 MED ORDER — MOLNUPIRAVIR EUA 200MG CAPSULE: 4.0000 | ORAL_CAPSULE | Freq: Two times a day (BID) | ORAL | 0 refills | Status: AC

## 2021-12-13 NOTE — Progress Notes (Signed)
Subjective:     Patient ID: Stacey Gardner, female   DOB: 03/18/1965, 56 y.o.   MRN: [id number]  This visit type was conducted due to national recommendations for restrictions regarding the COVID-19 Pandemic (e.g. social distancing) in an effort to limit this patient's exposure and mitigate transmission in our community.  Due to their co-morbid illnesses, this patient is at least at moderate risk for complications without adequate follow up.  This format is felt to be most appropriate for this patient at this time.    Documentation for virtual audio and video telecommunications through [facility] encounter:  The patient was located at home. The provider was located in the office. The patient did consent to this visit and is aware of possible charges through their insurance for this visit.  The other persons participating in this telemedicine service were none. Time spent on call was 20 minutes and in review of previous records 20 minutes total.  This virtual service is not related to other E/M service within previous 7 days.   HPI Chief Complaint  Patient presents with   Covid Positive    Throwing up, fever, severe congestion. Just got back from [place] visiting Father on 12/07/21 and symptoms started on Sunday. Tested positive yesterday   Virtual consult for illness.  Of note her husband is doing a visit today virtually to as he has some symptoms where he may be positive as well but he is doing worse.  She notes that she just got back from [place].  Unfortunately her father passed away last week.  He lived in [place].  She started getting sick when she got back last week on 12/09/21.   Current symptoms include fever low grade, chest congestion, head congestion.   Vomiting 2-3 times per day the last few days.  No vomiting today so far.  No food in 2 days but is hydrating.  Mild sore throat yesterday.  +headache.  No diarrhea.  No ear pain.  Has had body aches and chills the first  few days but none today.  No wheezing, no SOB.  Uses Breztri daily in general, but not having to use albuterol currently.  Had covid 2 years ago, and fought it off ok.   Using Cepacol and tylenol and her regular medications.  Not using cough medication.    Tested positive for covid yesterday.    Past Medical History:  Diagnosis Date   Allergic rhinitis, cause unspecified 06/24/2013   Allergy    Anemia    Asthma    Depression    Hypothyroid    Mixed hyperlipidemia 03/04/2016   Vitamin D deficiency    Current Outpatient Medications on File Prior to Visit  Medication Sig Dispense Refill   acetaminophen (TYLENOL) 650 MG CR tablet Take 1,300 mg by mouth as needed for pain.      Ascorbic Acid (VITAMIN C) 500 MG CHEW Chew by mouth.     Biotin 5 MG CAPS Take by mouth.     Cholecalciferol (VITAMIN D3 PO) Take by mouth.     levothyroxine (SYNTHROID) 88 MCG tablet TAKE 1 TABLET (88 MCG TOTAL) BY MOUTH DAILY WITH BREAKFAST. 30 tablet 2   minocycline (MINOCIN) 100 MG capsule Take 1 capsule (100 mg total) by mouth daily. 90 capsule 0   montelukast (SINGULAIR) 10 MG tablet TAKE 1 TABLET BY MOUTH EVERYDAY AT BEDTIME 30 tablet 2   Omega-3 Fatty Acids (FISH OIL) 1200 MG CAPS Take by mouth.     pyridoxine (B-6) 100  MG tablet Take 100 mg by mouth daily.     vitamin E 200 UNIT capsule Take 200 Units by mouth daily.     No current facility-administered medications on file prior to visit.   Review of Systems As in subjective    Objective:   Physical Exam Due to coronavirus pandemic stay at home measures, patient visit was virtual and they were not examined in person.    Temp (!) 100.4 F (38 C)   Gen: Well-developed, well-nourished, no acute distress No obvious wheezing or shortness of breath but sounds congested No labored breathing Answers questions appropriately      Assessment:     Encounter Diagnoses  Name Primary?   COVID-19 virus infection Yes   Hypothyroidism, unspecified  type    Moderate persistent asthma without complication    Moderate persistent extrinsic asthma without complication        Plan:     We discussed limitation of virtual consult.  Given her current symptoms and underlying health history including asthma, we discussed treatment recommendations.  Begin Molnupiravir, EmergenC vitamin pack, begin Promethazine DM cough syrup, continue her routine inhaler Breztri which she has been taking.  Use albuterol as needed.  We discussed the importance of rest and hydration.  Can use Tylenol or ibuprofen for fever or not feeling well  We discussed that if she is much worse in the coming days consider going to the emergency department.  Currently she has had no more vomiting as of last night and overall seems to be handling this pretty well.  She has had COVID about 2 years ago as well and did okay with that then.  I am more concerned about her husband who is lethargic and confused.  We discussed his care, and he has a separate visit today with documentation.  We discussed the need to quarantine for the next 3 to 5 days until much improved   Stacey Gardner was seen today for covid positive.  Diagnoses and all orders for this visit:  COVID-19 virus infection  Hypothyroidism, unspecified type  Moderate persistent asthma without complication  Moderate persistent extrinsic asthma without complication -     albuterol (VENTOLIN HFA) 108 (90 Base) MCG/ACT inhaler; Inhale 2 puffs into the lungs every 6 (six) hours as needed for wheezing or shortness of breath.  Other orders -     molnupiravir EUA (LAGEVRIO) 200 mg CAPS capsule; Take 4 capsules (800 mg total) by mouth 2 (two) times daily for 5 days. -     promethazine-dextromethorphan (PROMETHAZINE-DM) 6.25-15 MG/5ML syrup; Take 5 mLs by mouth 4 (four) times daily as needed for cough. -     Multiple Vitamins-Minerals (EMERGEN-C IMMUNE PLUS) PACK; Take 1 tablet by mouth 2 (two) times daily. -      Budeson-Glycopyrrol-Formoterol (BREZTRI AEROSPHERE) 160-9-4.8 MCG/ACT AERO; Inhale 2 puffs into the lungs in the morning and at bedtime.  F/u prn

## 2021-12-18 ENCOUNTER — Encounter: Payer: Self-pay | Admitting: Internal Medicine

## 2022-01-08 ENCOUNTER — Other Ambulatory Visit: Payer: Self-pay | Admitting: Medical

## 2022-01-08 DIAGNOSIS — E039 Hypothyroidism, unspecified: Secondary | ICD-10-CM

## 2022-01-22 ENCOUNTER — Encounter: Payer: Self-pay | Admitting: Nurse Practitioner

## 2022-01-22 ENCOUNTER — Ambulatory Visit (INDEPENDENT_AMBULATORY_CARE_PROVIDER_SITE_OTHER): Payer: Commercial Managed Care - HMO | Admitting: Nurse Practitioner

## 2022-01-22 VITALS — Ht 65.0 in | Wt 157.0 lb

## 2022-01-22 DIAGNOSIS — R052 Subacute cough: Secondary | ICD-10-CM

## 2022-01-22 MED ORDER — AZITHROMYCIN 250 MG PO TABS: ORAL_TABLET | ORAL | 0 refills | Status: AC

## 2022-01-22 MED ORDER — PREDNISONE 20 MG PO TABS: ORAL_TABLET | ORAL | 0 refills | Status: DC

## 2022-01-22 NOTE — Assessment & Plan Note (Addendum)
Ongoing cough since COVID infection in September with worsening of symptoms over the past week or so. Patient is wheezing audibly while on the phone. I do suspect she has a component of bronchitis present, but with the associated sinus symptoms there are concerns for secondary infection. Will send treatment with azithromycin and prednisone taper. She has tolerated both in the past. She declines azelastine nasal spray at this time. Discussed if her symptoms are still present into next week to contact the office and we will send in an order for CXR. Mucinex and rest recommended.

## 2022-01-22 NOTE — Progress Notes (Signed)
Virtual Visit Encounter telephone visit.   I connected with  Jerrye Beavers on 01/22/22 at  2:15 PM EST by secure audio telemedicine application. I verified that I am speaking with the correct person using two identifiers.   I introduced myself as a Publishing rights manager with the practice. The limitations of evaluation and management by telemedicine discussed with the patient and the availability of in person appointments. The patient expressed verbal understanding and consent to proceed.  Participating parties in this visit include: Myself and patient  The patient is: Patient Location: Home I am: Provider Location: Office/Clinic Subjective:    CC and HPI: Denisia Harpole is a 56 y.o. year old female presenting for follow up of COVID cough. Patient reports the following: Cough She tells me she had COVID in September and the cough has continued since that time. She endorses wheezing, cough, rhinorrhea. She is currently not having any fevers. She tells me that her symptoms are worsening to where she "feels like I am getting a cold". She has been using albuterol inhaler about every 4 hours. She denies shortness of breath at rest.   Past medical history, Surgical history, Family history not pertinant except as noted below, Social history, Allergies, and medications have been entered into the medical record, reviewed, and corrections made.   Review of Systems:  All review of systems negative except what is listed in the HPI  Objective:    Alert and oriented x 4 Wheezing audible. Congested with course cough present.  Speaking in clear sentences. No distress.  Impression and Recommendations:    Problem List Items Addressed This Visit     Cough - Primary    Ongoing cough since COVID infection in September with worsening of symptoms over the past week or so. Patient is wheezing audibly while on the phone. I do suspect she has a component of bronchitis present, but with the associated  sinus symptoms there are concerns for secondary infection. Will send treatment with azithromycin and prednisone taper. She has tolerated both in the past. She declines azelastine nasal spray at this time. Discussed if her symptoms are still present into next week to contact the office and we will send in an order for CXR. Mucinex and rest recommended.       Relevant Medications   azithromycin (ZITHROMAX) 250 MG tablet   predniSONE (DELTASONE) 20 MG tablet    orders and follow up as documented in EMR I discussed the assessment and treatment plan with the patient. The patient was provided an opportunity to ask questions and all were answered. The patient agreed with the plan and demonstrated an understanding of the instructions.   The patient was advised to call back or seek an in-person evaluation if the symptoms worsen or if the condition fails to improve as anticipated.  Follow-Up: prn  I provided 19 minutes of non-face-to-face interaction with this non face-to-face encounter including intake, same-day documentation, and chart review.   Tollie Eth, NP , DNP, AGNP-c [facility] at [facility] [phone] (fax)

## 2022-02-01 ENCOUNTER — Encounter: Payer: Self-pay | Admitting: Nurse Practitioner

## 2022-02-01 DIAGNOSIS — L700 Acne vulgaris: Secondary | ICD-10-CM

## 2022-02-05 MED ORDER — MINOCYCLINE HCL 100 MG PO CAPS: 100.0000 mg | ORAL_CAPSULE | Freq: Every day | ORAL | 3 refills | Status: DC

## 2022-02-20 ENCOUNTER — Ambulatory Visit
Admission: RE | Admit: 2022-02-20 | Discharge: 2022-02-20 | Disposition: A | Discharge status: Home / Self Care | Payer: Commercial Managed Care - HMO | Source: Ambulatory Visit | Attending: Medical | Admitting: Medical

## 2022-02-20 ENCOUNTER — Ambulatory Visit (INDEPENDENT_AMBULATORY_CARE_PROVIDER_SITE_OTHER): Payer: Commercial Managed Care - HMO | Admitting: Medical

## 2022-02-20 ENCOUNTER — Encounter: Payer: Self-pay | Admitting: Medical

## 2022-02-20 VITALS — BP 122/80 | HR 79 | Temp 98.1°F | Wt 166.4 lb

## 2022-02-20 DIAGNOSIS — L989 Disorder of the skin and subcutaneous tissue, unspecified: Secondary | ICD-10-CM | POA: Diagnosis not present

## 2022-02-20 DIAGNOSIS — R079 Chest pain, unspecified: Secondary | ICD-10-CM | POA: Diagnosis not present

## 2022-02-20 DIAGNOSIS — R0781 Pleurodynia: Secondary | ICD-10-CM

## 2022-02-20 NOTE — Progress Notes (Signed)
Subjective:  Stacey Gardner is a 57 y.o. female who presents for Chief Complaint  Patient presents with   other    Pain in lt. Side tender to touch and sometimes after eating, started 1 month ago after having covid had bad cough, taking cough drops a lot thought maybe it was from that, stomach pain started 2 weeks, also has mole in the same area wants referral [facility],      Here for pains in left side, left chest/upper abdomen. Started 2 weeks ago.  Started about a month after having cough from covid.  Hurts to touch.  No rash in the same area.  No nausea, no vomiting. Not particularly worse with motion.  No trauma, no fall.    Sees Dr. Benjie Karvonen, gynecology.  Has history of dense and tender breasts, uses vitamins for this.   It does seem to be worse after eating foods.  Has tried OTC GERD medication.  Also has mole in left chest wants looked at. Was seeing dermatology prior, but Dr. Onalee Hua office close.    Mom died breast cancer age 65.  Lifetime nonsmoker.  No other aggravating or relieving factors.    No other c/o.  Past Medical History:  Diagnosis Date   Allergic rhinitis, cause unspecified 06/24/2013   Allergy    Anemia    Asthma    Depression    Hypothyroid    Mixed hyperlipidemia 03/04/2016   Vitamin D deficiency    Current Outpatient Medications on File Prior to Visit  Medication Sig Dispense Refill   Biotin 5 MG CAPS Take by mouth.     fluticasone furoate-vilanterol (BREO ELLIPTA) 200-25 MCG/ACT AEPB Inhale 1 puff into the lungs daily. (Patient not taking: Reported on 01/22/2022) 60 each 2   levothyroxine (SYNTHROID) 88 MCG tablet TAKE 1 TABLET (88 MCG TOTAL) BY MOUTH DAILY WITH BREAKFAST. 30 tablet 2   minocycline (MINOCIN) 100 MG capsule Take 1 capsule (100 mg total) by mouth daily. 30 capsule 3   montelukast (SINGULAIR) 10 MG tablet TAKE 1 TABLET BY MOUTH EVERYDAY AT BEDTIME 30 tablet 2   Multiple Vitamins-Minerals (IMMUNE SUPPORT PO) Take 4 each by  mouth daily.     pyridoxine (B-6) 100 MG tablet Take 100 mg by mouth daily.     acetaminophen (TYLENOL) 650 MG CR tablet Take 1,300 mg by mouth as needed for pain.  (Patient not taking: Reported on 01/22/2022)     albuterol (VENTOLIN HFA) 108 (90 Base) MCG/ACT inhaler Inhale 2 puffs into the lungs every 6 (six) hours as needed for wheezing or shortness of breath. (Patient not taking: Reported on 02/20/2022) 18 g 1   Omega-3 Fatty Acids (FISH OIL) 1200 MG CAPS Take by mouth. (Patient not taking: Reported on 01/22/2022)     predniSONE (DELTASONE) 20 MG tablet Take 60mg  PO daily x 2 days, then40mg  PO daily x 2 days, then 20mg  PO daily x 3 days (Patient not taking: Reported on 02/20/2022) 13 tablet 0   No current facility-administered medications on file prior to visit.    The following portions of the patient's history were reviewed and updated as appropriate: allergies, current medications, past family history, past medical history, past social history, past surgical history and problem list.  ROS Otherwise as in subjective above  Objective: BP 122/80   Pulse 79   Temp 98.1 F (36.7 C)   Wt 166 lb 6.4 oz (75.5 kg)   BMI 27.69 kg/m   General appearance: alert, no distress, well  developed, well nourished Skin: scattered macules in general, left mid chest inferior to breasts with oval brown raised stuck on appearing lesion approx 1.4cm x 38mm lesion suggestive of seborrheic keratosis Heart: RRR, normal S1, S2, no murmurs Lungs: CTA bilaterally, no wheezes, rhonchi, or rales Chest: tender over left mid ribs, no swelling, discoloration or deformity, I:E normal.   Breast exam declined Abdomen: +bs, soft, non tender, non distended, no masses, no hepatomegaly, no splenomegaly Pulses: 2+ radial pulses, 2+ pedal pulses, normal cap refill Ext: no edema   Assessment: Encounter Diagnoses  Name Primary?   Rib pain Yes   Chest pain, unspecified type    Skin lesion      Plan: Rib pain, chest  pain, unclear etiology.  Differential could include inflammation from recent cough and illness, costochondritis, hiatal hernia or other.  Will send for chest x-ray and rib x-ray  Skin lesion-likely seborrheic keratosis.  Referral to dermatology for skin surveillance in general   [place] was seen today for other.  Diagnoses and all orders for this visit:  Rib pain -     DG Ribs Unilateral Left; Future -     DG Chest 2 View; Future  Chest pain, unspecified type -     DG Ribs Unilateral Left; Future -     DG Chest 2 View; Future  Skin lesion -     Cancel: Ambulatory referral to Dermatology -     Ambulatory referral to Dermatology    Follow up: pending xray

## 2022-02-20 NOTE — Patient Instructions (Signed)
Please go to [facility] for your chest/rib xray.   Their hours are 8am - 4:30 pm Monday - Friday.  Take your insurance card with you.  [facility] [phone]   [address] [place], [place] [address]

## 2022-02-22 ENCOUNTER — Other Ambulatory Visit: Payer: Self-pay | Admitting: Medical

## 2022-02-22 MED ORDER — OMEPRAZOLE 40 MG PO CPDR: 40.0000 mg | DELAYED_RELEASE_CAPSULE | Freq: Every day | ORAL | 0 refills | Status: DC

## 2022-02-22 MED ORDER — IBUPROFEN 600 MG PO TABS: 600.0000 mg | ORAL_TABLET | Freq: Two times a day (BID) | ORAL | 0 refills | Status: DC

## 2022-02-22 NOTE — Progress Notes (Signed)
Results sent through MyChart

## 2022-03-01 ENCOUNTER — Telehealth: Payer: Self-pay | Admitting: Medical

## 2022-03-01 DIAGNOSIS — E039 Hypothyroidism, unspecified: Secondary | ICD-10-CM

## 2022-03-05 ENCOUNTER — Other Ambulatory Visit: Payer: Self-pay | Admitting: Physician Assistant

## 2022-03-05 DIAGNOSIS — J4540 Moderate persistent asthma, uncomplicated: Secondary | ICD-10-CM

## 2022-03-05 NOTE — Telephone Encounter (Signed)
Called patient to see if she requested this or if this was an autorefill from [facility].

## 2022-03-11 ENCOUNTER — Other Ambulatory Visit: Payer: Self-pay

## 2022-03-11 DIAGNOSIS — J4540 Moderate persistent asthma, uncomplicated: Secondary | ICD-10-CM

## 2022-03-11 MED ORDER — ALBUTEROL SULFATE HFA 108 (90 BASE) MCG/ACT IN AERS: 2.0000 | INHALATION_SPRAY | Freq: Four times a day (QID) | RESPIRATORY_TRACT | 1 refills | Status: DC | PRN

## 2022-03-11 NOTE — Telephone Encounter (Signed)
Pt scheduled her physical and needs a refill on her albuterol  [id number] - [place], [place] - [place]

## 2022-03-14 ENCOUNTER — Encounter: Payer: Managed Care, Other (non HMO) | Admitting: Physician Assistant

## 2022-04-15 ENCOUNTER — Ambulatory Visit (INDEPENDENT_AMBULATORY_CARE_PROVIDER_SITE_OTHER): Payer: Commercial Managed Care - HMO | Admitting: Nurse Practitioner

## 2022-04-15 ENCOUNTER — Encounter: Payer: Self-pay | Admitting: Nurse Practitioner

## 2022-04-15 VITALS — BP 124/82 | HR 78 | Ht 66.0 in | Wt 161.6 lb

## 2022-04-15 DIAGNOSIS — Z0000 Encounter for general adult medical examination without abnormal findings: Secondary | ICD-10-CM

## 2022-04-15 DIAGNOSIS — H539 Unspecified visual disturbance: Secondary | ICD-10-CM | POA: Diagnosis not present

## 2022-04-15 DIAGNOSIS — N3941 Urge incontinence: Secondary | ICD-10-CM | POA: Diagnosis not present

## 2022-04-15 DIAGNOSIS — E782 Mixed hyperlipidemia: Secondary | ICD-10-CM

## 2022-04-15 DIAGNOSIS — E039 Hypothyroidism, unspecified: Secondary | ICD-10-CM

## 2022-04-15 DIAGNOSIS — J4540 Moderate persistent asthma, uncomplicated: Secondary | ICD-10-CM

## 2022-04-15 DIAGNOSIS — H25091 Other age-related incipient cataract, right eye: Secondary | ICD-10-CM

## 2022-04-15 DIAGNOSIS — E538 Deficiency of other specified B group vitamins: Secondary | ICD-10-CM | POA: Insufficient documentation

## 2022-04-15 DIAGNOSIS — R635 Abnormal weight gain: Secondary | ICD-10-CM

## 2022-04-15 DIAGNOSIS — G5621 Lesion of ulnar nerve, right upper limb: Secondary | ICD-10-CM

## 2022-04-15 DIAGNOSIS — E559 Vitamin D deficiency, unspecified: Secondary | ICD-10-CM

## 2022-04-15 NOTE — Patient Instructions (Signed)
WEIGHT LOSS PLANNING Your progress today shows:     04/15/2022    1:47 PM 02/20/2022   10:31 AM 01/22/2022    1:39 PM  Vitals with BMI  Height '5\' 6"'$   '5\' 5"'$   Weight 161 lbs 10 oz 166 lbs 6 oz 157 lbs  BMI 26.1 [id number]  Systolic [id number]   Diastolic 82 80   Pulse 78 79     For best management of weight, it is vital to balance intake versus output. This means the number of calories burned per day must be less than the calories you take in with food and drink.   I recommend trying to follow a diet with the following: Calories: 1200-1500 calories per day Carbohydrates: 150-180 grams of carbohydrates per day  Why: Gives your body enough "quick fuel" for cells to maintain normal function without sending them into starvation mode.  Protein: At least 90 grams of protein per day- 30 grams with each meal Why: Protein takes longer and uses more energy than carbohydrates to break down for fuel. The carbohydrates in your meals serves as quick energy sources and proteins help use some of that extra quick energy to break down to produce long term energy. This helps you not feel hungry as quickly and protein breakdown burns calories.  Water: Drink AT LEAST 64 ounces of water per day  Why: Water is essential to healthy metabolism. Water helps to fill the stomach and keep you fuller longer. Water is required for healthy digestion and filtering of waste in the body.  Fat: Limit fats in your diet- when choosing fats, choose foods with lower fats content such as lean meats (chicken, fish, [place]).  Why: Increased fat intake leads to storage "for later". Once you burn your carbohydrate energy, your body goes into fat and protein breakdown mode to help you loose weight.  Cholesterol: Fats and oils that are LIQUID at room temperature are best. Choose vegetable oils (olive oil, avocado oil, nuts). Avoid fats that are SOLID at room temperature (animal fats, processed meats). Healthy fats are often found in whole  grains, beans, nuts, seeds, and berries.  Why: Elevated cholesterol levels lead to build up of cholesterol on the inside of your blood vessels. This will eventually cause the blood vessels to become hard and can lead to high blood pressure and damage to your organs. When the blood flow is reduced, but the pressure is high from cholesterol buildup, parts of the cholesterol can break off and form clots that can go to the brain or heart leading to a stroke or heart attack.  Fiber: Increase amount of SOLUBLE the fiber in your diet. This helps to fill you up, lowers cholesterol, and helps with digestion. Some foods high in soluble fiber are oats, peas, beans, apples, carrots, barley, and citrus fruits.   Why: Fiber fills you up, helps remove excess cholesterol, and aids in healthy digestion which are all very important in weight management.   I recommend the following as a minimum activity routine: Purposeful walk or other physical activity at least 20 minutes every single day. This means purposefully taking a walk, jog, bike, swim, treadmill, elliptical, dance, etc.  This activity should be ABOVE your normal daily activities, such as walking at work. Goal exercise should be at least 150 minutes a week- work your way up to this.   Heart Rate: Your maximum exercise heart rate should be 220 - Your Age in Years. When exercising, get your heart rate  up, but avoid going over the maximum targeted heart rate.  60-70% of your maximum heart rate is where you tend to burn the most fat. To find this number:  220 - Age In Years= Max HR  Max HR x 0.6 (or 0.7) = Fat Burning HR The Fat Burning HR is your goal heart rate while working out to burn the most fat.  NEVER exercise to the point your feel lightheaded, weak, nauseated, dizzy. If you experience ANY of these symptoms- STOP exercise! Allow yourself to cool down and your heart rate to come down. Then restart slower next time.  If at [facility] you feel chest pain  or chest pressure during exercise, STOP IMMEDIATELY and seek medical attention.

## 2022-04-15 NOTE — Progress Notes (Unsigned)
Stacey Keeler, DNP, AGNP-c [address], [place] [address] Main Office [phone]  BP 124/82   Pulse 78   Ht '5\' 6"'$  (1.676 m)   Wt 161 lb 9.6 oz (73.3 kg)   BMI 26.08 kg/m    Subjective:    Patient ID: Stacey Gardner, female    DOB: 1965/06/17, 57 y.o.   MRN: [id number]  HPI: Stacey Gardner is a 57 y.o. female presenting on 04/15/2022 for comprehensive medical examination. Prevnar 13: Prevnar 13 N/A for this patient  Current medical concerns include:Right eye   Dr. Katy Fitch  IMMUNIZATIONS:   Flu: Flu vaccine completed elsewhere this season {Prevnar 13:28477:o:"Prevnar 13 N/A for this patient"} {Prevnar 20:28476:o:"Prevnar 20 N/A for this patient"} {Pneumovax 23:28475:o:"Pneumovax 23 N/A for this patient"} {Vac Shingrix:28478:o:"Shingrix N/A for this patient"} {HPV:28479:o:"HPV N/A for this patient"} {Tetanus:28480:o:"Tetanus completed in the last 10 years"}  HEALTH MAINTENANCE: Pap Smear {HM Status:28583:o} Mammogram {HM Status:28583:o} Colon Cancer Screening {HM Status:28583:o} Bone Density {HM Status:28583:o} STI Testing {HM Status:28583:o}  She reports regular vision exams q1-5y: Yes  She reports regular dental exams q 2m  Yes  The patient eats a regular, healthy diet. She endorses exercise and/or activity of: {Blank single:19197::"***"}  She currently: {SESOCIALHISTORY:28582}   {Ros; complete female:19594::"A comprehensive review of systems was negative."}  Most Recent Depression Screen:     04/15/2022    1:47 PM 03/12/2021   12:38 PM 11/18/2019    1:45 PM 11/16/2018    1:34 PM 09/04/2017    8:28 AM  Depression screen PHQ 2/9  Decreased Interest 0 0 0 0 0  Down, Depressed, Hopeless 0 0 0 0 0  PHQ - 2 Score 0 0 0 0 0  Altered sleeping   1    Tired, decreased energy   0    Change in appetite   0    Feeling bad or failure about yourself    0    Trouble concentrating   0    Moving slowly or fidgety/restless    0    Suicidal thoughts   0    PHQ-9 Score   1    Difficult doing work/chores   Not difficult at all     Most Recent Anxiety Screen:      No data to display         Most Recent Fall Screen:    04/15/2022    1:47 PM 11/16/2018    1:34 PM 09/04/2017    8:28 AM 04/25/2016    2:25 PM 03/04/2016    2:16 PM  Fall Risk   Falls in the past year? 0 0 No No No  Number falls in past yr: 0 0     Injury with Fall? 0 0     Risk for fall due to : No Fall Risks      Follow up Falls evaluation completed        Past medical history, surgical history, medications, allergies, family history and social history reviewed with patient today and changes made to appropriate areas of the chart.  Past Medical History:  Past Medical History:  Diagnosis Date   Allergic rhinitis, cause unspecified 06/24/2013   Allergy    Anemia    Asthma    Depression    Hypothyroid    Mixed hyperlipidemia 03/04/2016   Vitamin D deficiency    Medications:  Current Outpatient Medications on File Prior to Visit  Medication Sig   albuterol (VENTOLIN HFA) 108 (90 Base) MCG/ACT inhaler  Inhale 2 puffs into the lungs every 6 (six) hours as needed for wheezing or shortness of breath.   Biotin 5 MG CAPS Take by mouth.   cholecalciferol (VITAMIN D3) 25 MCG (1000 UNIT) tablet Take 1,000 Units by mouth daily.   fluticasone furoate-vilanterol (BREO ELLIPTA) 200-25 MCG/ACT AEPB Inhale 1 puff into the lungs daily. (Patient not taking: Reported on 01/22/2022)   ibuprofen (ADVIL) 600 MG tablet Take 1 tablet (600 mg total) by mouth in the morning and at bedtime.   levothyroxine (SYNTHROID) 88 MCG tablet TAKE 1 TABLET (88 MCG TOTAL) BY MOUTH DAILY WITH BREAKFAST.   montelukast (SINGULAIR) 10 MG tablet TAKE 1 TABLET BY MOUTH EVERYDAY AT BEDTIME   Multiple Vitamins-Minerals (IMMUNE SUPPORT PO) Take 4 each by mouth daily.   Omega-3 Fatty Acids (FISH OIL) 1200 MG CAPS Take by mouth.   omeprazole (PRILOSEC) 40 MG capsule Take 1 capsule (40  mg total) by mouth daily.   pyridoxine (B-6) 100 MG tablet Take 100 mg by mouth daily.   Turmeric 400 MG CAPS Take by mouth.   minocycline (MINOCIN) 100 MG capsule Take 1 capsule (100 mg total) by mouth daily. (Patient not taking: Reported on 04/15/2022)   No current facility-administered medications on file prior to visit.   Surgical History:  Past Surgical History:  Procedure Laterality Date   CERVICAL POLYPECTOMY     Allergies:  Allergies  Allergen Reactions   Codeine Nausea Only   Cough Syrup [Guaifenesin]     Has to have cough syrup without codeine   Cortisone Rash    Can't take cream   Neosporin [Neomycin-Polymyxin-Gramicidin] Rash   Family History:  Family History  Problem Relation Age of Onset   Cancer Mother 63       breast   AAA (abdominal aortic aneurysm) Mother    Heart disease Father        Objective:    BP 124/82   Pulse 78   Ht '5\' 6"'$  (1.676 m)   Wt 161 lb 9.6 oz (73.3 kg)   BMI 26.08 kg/m   Wt Readings from Last 3 Encounters:  04/15/22 161 lb 9.6 oz (73.3 kg)  02/20/22 166 lb 6.4 oz (75.5 kg)  01/22/22 157 lb (71.2 kg)    Physical Exam  Results for orders placed or performed in visit on 03/12/21  CBC with Differential/Platelet  Result Value Ref Range   WBC 5.6 3.4 - 10.8 x10E3/uL   RBC 4.63 3.77 - 5.28 x10E6/uL   Hemoglobin 14.1 11.1 - 15.9 g/dL   Hematocrit 41.3 34.0 - 46.6 %   MCV 89 79 - 97 fL   MCH 30.5 26.6 - 33.0 pg   MCHC 34.1 31.5 - 35.7 g/dL   RDW 13.1 11.7 - 15.4 %   Platelets 295 150 - 450 x10E3/uL   Neutrophils 54 Not Estab. %   Lymphs 36 Not Estab. %   Monocytes 8 Not Estab. %   Eos 1 Not Estab. %   Basos 1 Not Estab. %   Neutrophils Absolute 3.1 1.4 - 7.0 x10E3/uL   Lymphocytes Absolute 2.0 0.7 - 3.1 x10E3/uL   Monocytes Absolute 0.4 0.1 - 0.9 x10E3/uL   EOS (ABSOLUTE) 0.1 0.0 - 0.4 x10E3/uL   Basophils Absolute 0.0 0.0 - 0.2 x10E3/uL   Immature Granulocytes 0 Not Estab. %   Immature Grans (Abs) 0.0 0.0 - 0.1 x10E3/uL   Comprehensive metabolic panel  Result Value Ref Range   Glucose 76 70 - 99 mg/dL  BUN 16 6 - 24 mg/dL   Creatinine, Ser 1.00 0.57 - 1.00 mg/dL   eGFR 67 >59 mL/min/1.73   BUN/Creatinine Ratio 16 9 - 23   Sodium 140 134 - 144 mmol/L   Potassium 4.4 3.5 - 5.2 mmol/L   Chloride 100 96 - 106 mmol/L   CO2 24 20 - 29 mmol/L   Calcium 10.1 8.7 - 10.2 mg/dL   Total Protein 7.5 6.0 - 8.5 g/dL   Albumin 5.0 (H) 3.8 - 4.9 g/dL   Globulin, Total 2.5 1.5 - 4.5 g/dL   Albumin/Globulin Ratio 2.0 1.2 - 2.2   Bilirubin Total 0.4 0.0 - 1.2 mg/dL   Alkaline Phosphatase 154 (H) 44 - 121 IU/L   AST 35 0 - 40 IU/L   ALT 22 0 - 32 IU/L  Lipid panel  Result Value Ref Range   Cholesterol, Total 229 (H) 100 - 199 mg/dL   Triglycerides 107 0 - 149 mg/dL   HDL 60 >39 mg/dL   VLDL Cholesterol Cal 19 5 - 40 mg/dL   LDL Chol Calc (NIH) 150 (H) 0 - 99 mg/dL   Chol/HDL Ratio 3.8 0.0 - 4.4 ratio  TSH + free T4  Result Value Ref Range   TSH 1.860 0.450 - 4.500 uIU/mL   Free T4 1.89 (H) 0.82 - 1.77 ng/dL  Hemoglobin A1c  Result Value Ref Range   Hgb A1c MFr Bld 5.4 4.8 - 5.6 %   Est. average glucose Bld gHb Est-mCnc 108 mg/dL         Assessment & Plan:   Problem List Items Addressed This Visit   None      Follow up plan: No follow-ups on file.  NEXT PREVENTATIVE PHYSICAL DUE IN 1 YEAR.  PATIENT COUNSELING PROVIDED FOR ALL ADULT PATIENTS:  Consume a well balanced diet low in saturated fats, cholesterol, and moderation in carbohydrates.   This can be as simple as monitoring portion sizes and cutting back on sugary beverages such as soda and juice to start with.    Daily water consumption of at least 64 ounces.  Physical activity at least 180 minutes per week, if just starting out.   This can be as simple as taking the stairs instead of the elevator and walking 2-3 laps around the office  purposefully every day.   STD protection, partner selection, and regular testing if high  risk.  Limited consumption of alcoholic beverages if alcohol is consumed.  For women, I recommend no more than 7 alcoholic beverages per week, spread out throughout the week.  Avoid "binge" drinking or consuming large quantities of alcohol in one setting.   Please let me know if you feel you may need help with reduction or quitting alcohol consumption.   Avoidance of nicotine, if used.  Please let me know if you feel you may need help with reduction or quitting nicotine use.   Daily mental health attention.  This can be in the form of 5 minute daily meditation, prayer, journaling, yoga, reflection, etc.   Purposeful attention to your emotions and mental state can significantly improve your overall wellbeing and Health.  Please know that I am here to help you with all of your health care goals and am happy to work with you to find a solution that works best for you.  The greatest advice I have received with any changes in life are to take it one step at a time, that even means if all you can focus  on is the next 60 seconds, then do that and celebrate your victories.  With any changes in life, you will have set backs, and that is OK. The important thing to remember is, if you have a set back, it is not a failure, it is an opportunity to try again!  Health Maintenance Recommendations Screening Testing Mammogram Every 1 -2 years based on history and risk factors Starting at age 82 Pap Smear Ages 21-39 every 3 years Ages 55-65 every 5 years with HPV testing More frequent testing may be required based on results and history Colon Cancer Screening Every 1-10 years based on test performed, risk factors, and history Starting at age 27 Bone Density Screening Every 2-10 years based on history Starting at age 30 for women Recommendations for men differ based on medication usage, history, and risk factors AAA Screening One time ultrasound Men 63-71 years old who have every smoked Lung Cancer  Screening Low Dose Lung CT every 12 months Age 91-80 years with a 30 pack-year smoking history who still smoke or who have quit within the last 15 years  Screening Labs Routine  Labs: Complete Blood Count (CBC), Complete Metabolic Panel (CMP), Cholesterol (Lipid Panel) Every 6-12 months based on history and medications May be recommended more frequently based on current conditions or previous results Hemoglobin A1c Lab Every 3-12 months based on history and previous results Starting at age 43 or earlier with diagnosis of diabetes, high cholesterol, BMI >26, and/or risk factors Frequent monitoring for patients with diabetes to ensure blood sugar control Thyroid Panel (TSH w/ T3 & T4) Every 6 months based on history, symptoms, and risk factors May be repeated more often if on medication HIV One time testing for all patients 86 and older May be repeated more frequently for patients with increased risk factors or exposure Hepatitis C One time testing for all patients 16 and older May be repeated more frequently for patients with increased risk factors or exposure Gonorrhea, Chlamydia Every 12 months for all sexually active persons 13-24 years Additional monitoring may be recommended for those who are considered high risk or who have symptoms PSA Men 66-96 years old with risk factors Additional screening may be recommended from age 20-69 based on risk factors, symptoms, and history  Vaccine Recommendations Tetanus Booster All adults every 10 years Flu Vaccine All patients 6 months and older every year COVID Vaccine All patients 12 years and older Initial dosing with booster May recommend additional booster based on age and health history HPV Vaccine 2 doses all patients age 21-26 Dosing may be considered for patients over 26 Shingles Vaccine (Shingrix) 2 doses all adults 65 years and older Pneumonia (Pneumovax 23) All adults 86 years and older May recommend earlier dosing based  on health history Pneumonia (Prevnar 77) All adults 30 years and older Dosed 1 year after Pneumovax 23  Additional Screening, Testing, and Vaccinations may be recommended on an individualized basis based on family history, health history, risk factors, and/or exposure.

## 2022-04-16 ENCOUNTER — Encounter: Payer: Self-pay | Admitting: Nurse Practitioner

## 2022-04-16 DIAGNOSIS — K5904 Chronic idiopathic constipation: Secondary | ICD-10-CM | POA: Insufficient documentation

## 2022-04-16 DIAGNOSIS — Z860100 Personal history of colon polyps, unspecified: Secondary | ICD-10-CM | POA: Insufficient documentation

## 2022-04-16 DIAGNOSIS — E782 Mixed hyperlipidemia: Secondary | ICD-10-CM

## 2022-04-16 DIAGNOSIS — R635 Abnormal weight gain: Secondary | ICD-10-CM | POA: Insufficient documentation

## 2022-04-16 LAB — LIPID PANEL
Chol/HDL Ratio: 5.9 ratio — ABNORMAL HIGH (ref 0.0–4.4)
Cholesterol, Total: 272 mg/dL — ABNORMAL HIGH (ref 100–199)
HDL: 46 mg/dL (ref >39)
LDL Chol Calc (NIH): 176 mg/dL — ABNORMAL HIGH (ref 0–99)
Triglycerides: 263 mg/dL — ABNORMAL HIGH (ref 0–149)
VLDL Cholesterol Cal: 50 mg/dL — ABNORMAL HIGH (ref 5–40)

## 2022-04-16 LAB — CBC WITH DIFFERENTIAL/PLATELET
Basophils Absolute: 0 10*3/uL (ref 0.0–0.2)
Basos: 1 %
EOS (ABSOLUTE): 0 10*3/uL (ref 0.0–0.4)
Eos: 1 %
Hematocrit: 40.1 % (ref 34.0–46.6)
Hemoglobin: 13.5 g/dL (ref 11.1–15.9)
Immature Grans (Abs): 0 10*3/uL (ref 0.0–0.1)
Immature Granulocytes: 0 %
Lymphocytes Absolute: 1.9 10*3/uL (ref 0.7–3.1)
Lymphs: 36 %
MCH: 29.9 pg (ref 26.6–33.0)
MCHC: 33.7 g/dL (ref 31.5–35.7)
MCV: 89 fL (ref 79–97)
Monocytes Absolute: 0.4 10*3/uL (ref 0.1–0.9)
Monocytes: 7 %
Neutrophils Absolute: 2.9 10*3/uL (ref 1.4–7.0)
Neutrophils: 55 %
Platelets: 280 10*3/uL (ref 150–450)
RBC: 4.51 x10E6/uL (ref 3.77–5.28)
RDW: 13.1 % (ref 11.7–15.4)
WBC: 5.2 10*3/uL (ref 3.4–10.8)

## 2022-04-16 LAB — VITAMIN D 25 HYDROXY (VIT D DEFICIENCY, FRACTURES): Vit D, 25-Hydroxy: 31 ng/mL (ref 30.0–100.0)

## 2022-04-16 LAB — VITAMIN B12: Vitamin B-12: 570 pg/mL (ref 232–1245)

## 2022-04-16 LAB — COMPREHENSIVE METABOLIC PANEL
ALT: 19 IU/L (ref 0–32)
AST: 23 IU/L (ref 0–40)
Albumin/Globulin Ratio: 2.1 (ref 1.2–2.2)
Albumin: 4.8 g/dL (ref 3.8–4.9)
Alkaline Phosphatase: 136 IU/L — ABNORMAL HIGH (ref 44–121)
BUN/Creatinine Ratio: 15 (ref 9–23)
BUN: 14 mg/dL (ref 6–24)
Bilirubin Total: 0.3 mg/dL (ref 0.0–1.2)
CO2: 19 mmol/L — ABNORMAL LOW (ref 20–29)
Calcium: 10.1 mg/dL (ref 8.7–10.2)
Chloride: 105 mmol/L (ref 96–106)
Creatinine, Ser: 0.94 mg/dL (ref 0.57–1.00)
Globulin, Total: 2.3 g/dL (ref 1.5–4.5)
Glucose: 88 mg/dL (ref 70–99)
Potassium: 4.5 mmol/L (ref 3.5–5.2)
Sodium: 145 mmol/L — ABNORMAL HIGH (ref 134–144)
Total Protein: 7.1 g/dL (ref 6.0–8.5)
eGFR: 71 mL/min/{1.73_m2} (ref >59)

## 2022-04-16 LAB — TSH: TSH: 0.979 u[IU]/mL (ref 0.450–4.500)

## 2022-04-16 LAB — HEMOGLOBIN A1C
Est. average glucose Bld gHb Est-mCnc: 111 mg/dL
Hgb A1c MFr Bld: 5.5 % (ref 4.8–5.6)

## 2022-04-16 LAB — T4, FREE: Free T4: 1.77 ng/dL (ref 0.82–1.77)

## 2022-04-16 NOTE — Assessment & Plan Note (Signed)
Stacey Gardner has difficulty losing weight despite adopting intermittent fasting, with only a 5-pound weight loss. She seeks additional strategies to enhance her weight loss efforts. Plan: - Provide Stacey Gardner with educational materials on weight loss strategies, including dietary recommendations and physical activity guidelines.

## 2022-04-16 NOTE — Assessment & Plan Note (Signed)

## 2022-04-16 NOTE — Assessment & Plan Note (Signed)
Stacey Gardner experiences shortness of breath, especially noticeable when speaking, which she relates to a past COVID-19 infection and underlying asthma. She has a history of using Albuterol and Montelukast but is inconsistent with their use. Plan: - Recommend consistent medication use, particularly Montelukast, and suggest placing it on her nightstand to improve adherence. - Consider pulmonology evaluation for full assessment.

## 2022-04-16 NOTE — Assessment & Plan Note (Signed)
Stacey Gardner experiences urinary urgency and pressure, leading to frequent bathroom visits, which she believes may be related to age-related changes in pelvic muscle strength. Plan: - Education provided on pelvic floor exercises to strengthen pelvic muscles and potentially reduce symptoms of urgency. - Discuss the option of medication to manage urge incontinence if symptoms persist or worsen.

## 2022-04-16 NOTE — Assessment & Plan Note (Signed)
Stacey Gardner mentions paresthesias intermittently to the right hand and fingers with historic diagnosis of cubital tunnel syndrome. At this time strength and sensation are intact with no concerning deficits.  Plan: - Consider orthopedic or neurology evaluation if symptoms persist or worsen.

## 2022-04-16 NOTE — Assessment & Plan Note (Signed)
Stacey Gardner reports difficulty swallowing certain foods, attributing this to her diagnosed Hashimoto's thyroiditis. She experiences discomfort with dry or dense foods like chicken and bread. Plan: - Continue management of hypothyroidism with the current levothyroxine dosage and monitor for any changes in symptoms or thyroid function tests that may necessitate adjustment. - Labs pending

## 2022-04-16 NOTE — Assessment & Plan Note (Signed)
Stacey Gardner reports significant visual impairment in her right eye, described as seeing only "stains of colors" and "blobs," suggesting a progression of previously diagnosed cataract. She is concerned about how this affects her daily activities. [name] also mentions a history of light sensitivity and discomfort in the affected eye. She has a preference to see Dr. Katy Fitch, as this is who her husband has seen in the past. Plan: - Referral placed for Dr Katy Fitch specifically to evaluate for cataract surgery. Alternatively, patient prefers Dr. Prudencio Burly at [facility].

## 2022-04-18 ENCOUNTER — Other Ambulatory Visit: Payer: Self-pay | Admitting: Medical

## 2022-04-18 MED ORDER — ROSUVASTATIN CALCIUM 10 MG PO TABS: 10.0000 mg | ORAL_TABLET | Freq: Every day | ORAL | 1 refills | Status: DC

## 2022-04-18 NOTE — Addendum Note (Signed)
Addended by: Jatin Naumann, Clarise Cruz E on: 04/18/2022 06:20 PM   Modules accepted: Orders

## 2022-04-23 ENCOUNTER — Other Ambulatory Visit: Payer: Self-pay | Admitting: Medical

## 2022-04-23 DIAGNOSIS — E039 Hypothyroidism, unspecified: Secondary | ICD-10-CM

## 2022-05-14 DIAGNOSIS — Z08 Encounter for follow-up examination after completed treatment for malignant neoplasm: Secondary | ICD-10-CM

## 2022-05-16 ENCOUNTER — Other Ambulatory Visit: Payer: Self-pay | Admitting: Nurse Practitioner

## 2022-05-16 DIAGNOSIS — J4540 Moderate persistent asthma, uncomplicated: Secondary | ICD-10-CM

## 2022-06-06 ENCOUNTER — Other Ambulatory Visit: Payer: Self-pay | Admitting: Medical

## 2022-06-29 ENCOUNTER — Other Ambulatory Visit: Payer: Self-pay | Admitting: Nurse Practitioner

## 2022-06-29 DIAGNOSIS — L700 Acne vulgaris: Secondary | ICD-10-CM

## 2022-07-17 ENCOUNTER — Other Ambulatory Visit: Payer: Self-pay | Admitting: Nurse Practitioner

## 2022-09-24 LAB — HM MAMMOGRAPHY

## 2022-10-12 ENCOUNTER — Other Ambulatory Visit: Payer: Self-pay | Admitting: Nurse Practitioner

## 2022-10-12 DIAGNOSIS — E782 Mixed hyperlipidemia: Secondary | ICD-10-CM

## 2022-10-23 ENCOUNTER — Telehealth: Payer: Commercial Managed Care - HMO | Admitting: Medical

## 2022-10-24 ENCOUNTER — Encounter: Payer: Self-pay | Admitting: Nurse Practitioner

## 2022-10-30 ENCOUNTER — Telehealth: Payer: Commercial Managed Care - HMO | Admitting: Medical

## 2022-10-30 DIAGNOSIS — J988 Other specified respiratory disorders: Secondary | ICD-10-CM

## 2022-10-30 DIAGNOSIS — J4540 Moderate persistent asthma, uncomplicated: Secondary | ICD-10-CM

## 2022-10-30 DIAGNOSIS — R051 Acute cough: Secondary | ICD-10-CM | POA: Diagnosis not present

## 2022-10-30 MED ORDER — PREDNISONE 10 MG PO TABS: ORAL_TABLET | ORAL | 0 refills | Status: DC

## 2022-10-30 MED ORDER — AZITHROMYCIN 250 MG PO TABS: ORAL_TABLET | ORAL | 0 refills | Status: DC

## 2022-10-30 MED ORDER — HYDROCODONE BIT-HOMATROP MBR 5-1.5 MG/5ML PO SOLN: 5.0000 mL | Freq: Three times a day (TID) | ORAL | 0 refills | Status: AC | PRN

## 2022-10-30 MED ORDER — FLUTICASONE-SALMETEROL 100-50 MCG/ACT IN AEPB: 1.0000 | INHALATION_SPRAY | Freq: Two times a day (BID) | RESPIRATORY_TRACT | 1 refills | Status: DC

## 2022-10-30 NOTE — Progress Notes (Signed)
Subjective:     Patient ID: Stacey Gardner, female   DOB: [birth year], 57 y.o.   MRN: [id number]  This visit type was conducted due to national recommendations for restrictions regarding the COVID-19 Pandemic (e.g. social distancing) in an effort to limit this patient's exposure and mitigate transmission in our community.  Due to their co-morbid illnesses, this patient is at least at moderate risk for complications without adequate follow up.  This format is felt to be most appropriate for this patient at this time.    Documentation for virtual audio and video telecommunications through [facility] encounter:  The patient was located at home. The provider was located in the office. The patient did consent to this visit and is aware of possible charges through their insurance for this visit.  The other persons participating in this telemedicine service were none. Time spent on call was 20 minutes and in review of previous records 20 minutes total.  This virtual service is not related to other E/M service within previous 7 days.   HPI Chief Complaint  Patient presents with   cough    Went to [place]- 2 weeks ago for a week was very cold on plane and sensitive. When arriving she started getting congestion and cough  x 2 week so not going away. Had a lot of sensitive reactions to weather and A/C   Virtual for cough.  She has underlying history of asthma.  Lately has not been doing preventative inhaler.  She is using her albuterol twice daily currently.  For the last 2 weeks she has had cough, congestion, phlegm production.  She has been traveling out of the country to [place].  No COVID contacts.  No fever no bodyaches or chills.  No sore throat.  No nausea vomiting diarrhea.  Not particular short of breath.  No ear pain.  No headaches.  No over-the-counter medications for symptoms.  She does note when the weather has changed such as some of the variations we have had lately, that gets  her asthma flared up a little bit.  The last time she got like this though with persistent cough she had to go on antibiotic.  No other aggravating or relieving factors. No other complaint.  Past Medical History:  Diagnosis Date   Allergic rhinitis, cause unspecified 06/24/2013   Allergy    Anemia    Asthma    Cough 08/08/2020   Depression    Depression 11/16/2018   Family history of diabetes mellitus 03/12/2021   Family history of premature CAD 08/08/2020   Fatigue 11/18/2019   History of COVID-19 08/08/2020   Hypothyroid    Mixed hyperlipidemia 03/04/2016   Vitamin D deficiency    Current Outpatient Medications on File Prior to Visit  Medication Sig Dispense Refill   albuterol (VENTOLIN HFA) 108 (90 Base) MCG/ACT inhaler TAKE 2 PUFFS BY MOUTH EVERY 6 HOURS AS NEEDED FOR WHEEZE OR SHORTNESS OF BREATH 8.5 each 3   Biotin 5 MG CAPS Take by mouth.     cholecalciferol (VITAMIN D3) 25 MCG (1000 UNIT) tablet Take 1,000 Units by mouth daily.     fluticasone furoate-vilanterol (BREO ELLIPTA) 200-25 MCG/ACT AEPB Inhale 1 puff into the lungs daily. (Patient not taking: Reported on 10/30/2022) 60 each 2   ibuprofen (ADVIL) 600 MG tablet Take 1 tablet (600 mg total) by mouth in the morning and at bedtime. 30 tablet 0   levothyroxine (SYNTHROID) 88 MCG tablet TAKE 1 TABLET (88 MCG TOTAL) BY MOUTH DAILY WITH  BREAKFAST. 90 tablet 1   montelukast (SINGULAIR) 10 MG tablet TAKE 1 TABLET BY MOUTH EVERYDAY AT BEDTIME 30 tablet 5   Multiple Vitamins-Minerals (IMMUNE SUPPORT PO) Take 4 each by mouth daily.     Omega-3 Fatty Acids (FISH OIL) 1200 MG CAPS Take by mouth.     pyridoxine (B-6) 100 MG tablet Take 100 mg by mouth daily.     rosuvastatin (CRESTOR) 10 MG tablet TAKE 1 TABLET (10 MG TOTAL) BY MOUTH DAILY. START WITH 1 TAB EVERY OTHER DAY FOR 2 WEEKS THEN MAY INCREASE TO DAILY. 30 tablet 5   Turmeric 400 MG CAPS Take by mouth.     minocycline (MINOCIN) 100 MG capsule TAKE 1 CAPSULE BY MOUTH  EVERY DAY (Patient not taking: Reported on 10/30/2022) 30 capsule 3   omeprazole (PRILOSEC) 40 MG capsule TAKE 1 CAPSULE (40 MG TOTAL) BY MOUTH DAILY. (Patient not taking: Reported on 10/30/2022) 30 capsule 5   No current facility-administered medications on file prior to visit.     Review of Systems As in subjective    Objective:   Physical Exam Due to coronavirus pandemic stay at home measures, patient visit was virtual and they were not examined in person.   There were no vitals taken for this visit.  General: Well-developed well-nourished no acute distress, coughing quite a bit No labored breathing or wheezing      Assessment:     Encounter Diagnoses  Name Primary?   Acute cough Yes   Respiratory tract infection    Moderate persistent asthma without complication        Plan:     Discussed symptoms and concerns.  Begin regimen below including short-term prednisone, begin back on preventative inhaler for at least 2 weeks with positive for the next few months given the fall allergy season.  Continue her current allergy medicine as usual.  Begin Z-Pak antibiotic.  Hycodan cough syrup as needed.  Rest, hydrate well.  Can use inhaler albuterol every 6 hours as needed for shortness of breath cough or wheezing.  F/u prn  Stacey Gardner was seen today for cough.  Diagnoses and all orders for this visit:  Acute cough  Respiratory tract infection  Moderate persistent asthma without complication  Other orders -     predniSONE (DELTASONE) 10 MG tablet; 6 tablets all together day 1, 5 tablets day 2, 4 tablets day 3, 3 tablets day 4, 2 tablets day 5, 1 tablet day 6. -     HYDROcodone bit-homatropine (HYCODAN) 5-1.5 MG/5ML syrup; Take 5 mLs by mouth every 8 (eight) hours as needed for up to 5 days for cough. -     fluticasone-salmeterol (ADVAIR) 100-50 MCG/ACT AEPB; Inhale 1 puff into the lungs 2 (two) times daily. -     azithromycin (ZITHROMAX) 250 MG tablet; 2 tablets day 1, then 1  tablet days 2-4    F/u prn

## 2022-11-02 DIAGNOSIS — E039 Hypothyroidism, unspecified: Secondary | ICD-10-CM

## 2022-11-04 MED ORDER — LEVOTHYROXINE SODIUM 88 MCG PO TABS
88.0000 ug | ORAL_TABLET | Freq: Every day | ORAL | 1 refills | Status: DC
Start: 2022-11-04 — End: 2023-04-16

## 2022-11-13 ENCOUNTER — Other Ambulatory Visit: Payer: Self-pay | Admitting: Nurse Practitioner

## 2022-11-13 DIAGNOSIS — L700 Acne vulgaris: Secondary | ICD-10-CM

## 2022-12-13 NOTE — Telephone Encounter (Signed)
Pt has an appt today at 2:30

## 2023-01-18 ENCOUNTER — Other Ambulatory Visit: Payer: Self-pay | Admitting: Nurse Practitioner

## 2023-03-21 LAB — HM COLONOSCOPY

## 2023-03-24 ENCOUNTER — Encounter: Payer: Self-pay | Admitting: Medical

## 2023-03-27 ENCOUNTER — Encounter: Payer: Self-pay | Admitting: Medical

## 2023-03-27 NOTE — Telephone Encounter (Signed)
Several [facility] messages sent to me from the patient demanding removal of diagnoses from the medical record.  I have reviewed the medical record extensively and the patients concerns are not part of the [facility] EMR, but rather listed as a diagnosis on a scanned report from her gastroenterologist.   I have sent this information to the patient with a list of her current and past medical history detailing that we cannot change documentation provided from outside sources. I suggested she reach out to Dr. Loreta Ave to discuss the concerns and request a change in her diagnoses as well as this updated documentation sent to use to replace the current document.    I wanted to keep you in the loop seeing that she has sent several messages demanding removal in short succession in the event you need to intervene.

## 2023-03-31 NOTE — Telephone Encounter (Signed)
 I have reviewed the chart and cannot find anything related to obesity.  I see history of depression, not current.  Colonoscopy results would need to be corrected by Dr. Loreta Ave.

## 2023-04-16 ENCOUNTER — Other Ambulatory Visit: Payer: Self-pay | Admitting: Nurse Practitioner

## 2023-04-16 DIAGNOSIS — E039 Hypothyroidism, unspecified: Secondary | ICD-10-CM

## 2023-04-16 DIAGNOSIS — L700 Acne vulgaris: Secondary | ICD-10-CM

## 2023-04-17 ENCOUNTER — Encounter: Payer: Commercial Managed Care - HMO | Admitting: Nurse Practitioner

## 2023-05-13 ENCOUNTER — Other Ambulatory Visit: Payer: Self-pay | Admitting: Nurse Practitioner

## 2023-05-13 DIAGNOSIS — E039 Hypothyroidism, unspecified: Secondary | ICD-10-CM

## 2023-05-13 DIAGNOSIS — L700 Acne vulgaris: Secondary | ICD-10-CM

## 2023-05-23 ENCOUNTER — Other Ambulatory Visit: Payer: Self-pay | Admitting: Nurse Practitioner

## 2023-05-23 ENCOUNTER — Encounter: Payer: Self-pay | Admitting: Nurse Practitioner

## 2023-05-23 DIAGNOSIS — E782 Mixed hyperlipidemia: Secondary | ICD-10-CM

## 2023-05-26 ENCOUNTER — Other Ambulatory Visit: Payer: Self-pay

## 2023-05-26 DIAGNOSIS — E782 Mixed hyperlipidemia: Secondary | ICD-10-CM

## 2023-05-26 MED ORDER — ROSUVASTATIN CALCIUM 10 MG PO TABS: 10.0000 mg | ORAL_TABLET | Freq: Every day | ORAL | 5 refills | Status: DC

## 2023-07-03 ENCOUNTER — Encounter: Admitting: Nurse Practitioner

## 2023-07-15 ENCOUNTER — Other Ambulatory Visit: Payer: Self-pay | Admitting: Nurse Practitioner

## 2023-07-15 DIAGNOSIS — E039 Hypothyroidism, unspecified: Secondary | ICD-10-CM

## 2023-07-19 ENCOUNTER — Other Ambulatory Visit: Payer: Self-pay | Admitting: Nurse Practitioner

## 2023-07-19 DIAGNOSIS — L700 Acne vulgaris: Secondary | ICD-10-CM

## 2023-09-05 NOTE — Progress Notes (Signed)
 PAP: Pneumonia:   Catheline Doing, DNP, AGNP-c [facility] [address] [place], [place] [address] Main Office [phone]  BP 124/74   Pulse 68   Ht 5' 6.25 (1.683 m)   Wt 164 lb 6.4 oz (74.6 kg)   BMI 26.33 kg/m    Subjective:    Patient ID: Stacey Gardner, female    DOB: 12-Jul-1965, 58 y.o.   MRN: [id number]  HPI: History of Present Illness Stacey Gardner is a 58 year old female who presents for an annual physical exam.  She feels rundown and tired, attributing this to the demands of caring for her husband, whose health issues require significant caregiving. Her caregiving responsibilities are physically demanding, especially at night when assisting her husband with mobility. This has led to significant fatigue, disrupted sleep, and feelings of burnout and occasional anxiety.  She has experienced weight gain, which she associates with anxiety and stress from caregiving. She is attempting to lose weight by adjusting her eating habits, such as having breakfast earlier and stopping eating by 4 PM. She finds it difficult to exercise due to her caregiving duties and is concerned about her weight gain since the COVID-19 pandemic.  She mentions a history of headaches, described as pain behind her left eye. She has had cataract surgery on this eye. She previously managed these headaches with various over-the-counter medications. She has a history of hepatitis A from her youth and is cautious about her liver health.  She reports occasional palpitations, cold sweats, and anxiety, which she associates with lack of sleep and stress. These episodes occur infrequently, with the last one happening about three weeks ago.  She is concerned about her cholesterol levels, as her last results showed high triglycerides and cholesterol. She is taking medication for cholesterol and montelukast  for allergies, which has reduced her need for inhalers. She has a family history of  high cholesterol.  She has experienced lactose intolerance, leading to symptoms like coughing and vomiting after consuming dairy products. She has eliminated lactose from her diet, which has resolved these symptoms.  She is taking minocycline  for acne, which she uses intermittently when flare-ups occur, particularly around her mouth and nose. She also takes a sublingual omeprazole  for gastrointestinal issues, which she finds effective.  Pertinent items are noted in HPI.   Most Recent Depression Screen:     09/08/2023   11:02 AM 04/15/2022    1:47 PM 03/12/2021   12:38 PM 11/18/2019    1:45 PM 11/16/2018    1:34 PM  Depression screen PHQ 2/9  Decreased Interest 0 0 0 0 0  Down, Depressed, Hopeless 0 0 0 0 0  PHQ - 2 Score 0 0 0 0 0  Altered sleeping    1   Tired, decreased energy    0   Change in appetite    0   Feeling bad or failure about yourself     0   Trouble concentrating    0   Moving slowly or fidgety/restless    0   Suicidal thoughts    0   PHQ-9 Score    1   Difficult doing work/chores    Not difficult at all    Most Recent Anxiety Screen:      No data to display         Most Recent Fall Screen:    09/08/2023   11:01 AM 04/15/2022    1:47 PM 11/16/2018    1:34 PM 09/04/2017  8:28 AM 04/25/2016    2:25 PM  Fall Risk   Falls in the past year? 0 0 0  No  No   Number falls in past yr: 0 0 0     Injury with Fall? 0 0 0    Risk for fall due to : No Fall Risks No Fall Risks     Follow up Falls evaluation completed Falls evaluation completed        Data saved with a previous flowsheet row definition    Past medical history, surgical history, medications, allergies, family history and social history reviewed with patient today and changes made to appropriate areas of the chart.  Past Medical History:  Past Medical History:  Diagnosis Date   Allergic rhinitis, cause unspecified 06/24/2013   Allergy    Anemia    Asthma    Cough 08/08/2020   Depression 11/16/2018    Family history of diabetes mellitus 03/12/2021   Family history of premature CAD 08/08/2020   Fatigue 11/18/2019   History of COVID-19 08/08/2020   Hypothyroid    Mixed hyperlipidemia 03/04/2016   Vitamin D  deficiency    Medications:  Current Outpatient Medications on File Prior to Visit  Medication Sig   albuterol  (VENTOLIN  HFA) 108 (90 Base) MCG/ACT inhaler TAKE 2 PUFFS BY MOUTH EVERY 6 HOURS AS NEEDED FOR WHEEZE OR SHORTNESS OF BREATH (Patient taking differently: prn)   Biotin 5 MG CAPS Take by mouth.   cholecalciferol (VITAMIN D3) 25 MCG (1000 UNIT) tablet Take 1,000 Units by mouth daily.   Multiple Vitamins-Minerals (IMMUNE SUPPORT PO) Take 4 each by mouth daily.   Omega-3 Fatty Acids (FISH OIL) 1200 MG CAPS Take by mouth.   omeprazole  (PRILOSEC) 40 MG capsule TAKE 1 CAPSULE (40 MG TOTAL) BY MOUTH DAILY. (Patient taking differently: Take 40 mg by mouth daily. Taking daily then stop for 4 months gets at target)   pyridoxine (B-6) 100 MG tablet Take 100 mg by mouth daily.   Turmeric 400 MG CAPS Take by mouth.   No current facility-administered medications on file prior to visit.   Surgical History:  Past Surgical History:  Procedure Laterality Date   CERVICAL POLYPECTOMY     Allergies:  Allergies  Allergen Reactions   Codeine Nausea Only   Hydrocortisone Rash   Neomycin-Bacitracin Zn-Polymyx Rash   Cough Syrup [Guaifenesin ]     Has to have cough syrup without codeine   Lactose Other (See Comments)   Miconazole Nitrate Other (See Comments)   Shellfish-Derived Products Other (See Comments)   Cortisone Rash    Can't take cream   Neosporin [Neomycin-Polymyxin-Gramicidin] Rash   Family History:  Family History  Problem Relation Age of Onset   Cancer Mother 68       breast   AAA (abdominal aortic aneurysm) Mother    Heart disease Father        Objective:    BP 124/74   Pulse 68   Ht 5' 6.25 (1.683 m)   Wt 164 lb 6.4 oz (74.6 kg)   BMI 26.33 kg/m   Wt  Readings from Last 3 Encounters:  09/08/23 164 lb 6.4 oz (74.6 kg)  04/15/22 161 lb 9.6 oz (73.3 kg)  02/20/22 166 lb 6.4 oz (75.5 kg)    Physical Exam Vitals and nursing note reviewed.  Constitutional:      General: She is not in acute distress.    Appearance: Normal appearance.  HENT:     Head: Normocephalic and atraumatic.  Right Ear: Hearing, tympanic membrane, ear canal and external ear normal.     Left Ear: Hearing, tympanic membrane, ear canal and external ear normal.     Nose: Nose normal.     Right Sinus: No maxillary sinus tenderness or frontal sinus tenderness.     Left Sinus: No maxillary sinus tenderness or frontal sinus tenderness.     Mouth/Throat:     Lips: Pink.     Mouth: Mucous membranes are moist.     Pharynx: Oropharynx is clear.  Eyes:     General: Lids are normal. Vision grossly intact.     Extraocular Movements: Extraocular movements intact.     Conjunctiva/sclera: Conjunctivae normal.     Pupils: Pupils are equal, round, and reactive to light.     Funduscopic exam:    Right eye: Red reflex present.        Left eye: Red reflex present.    Visual Fields: Right eye visual fields normal and left eye visual fields normal.  Neck:     Thyroid : No thyromegaly.     Vascular: No carotid bruit.  Cardiovascular:     Rate and Rhythm: Normal rate and regular rhythm.     Chest Wall: PMI is not displaced.     Pulses: Normal pulses.          Dorsalis pedis pulses are 2+ on the right side and 2+ on the left side.       Posterior tibial pulses are 2+ on the right side and 2+ on the left side.     Heart sounds: Normal heart sounds. No murmur heard. Pulmonary:     Effort: Pulmonary effort is normal. No respiratory distress.     Breath sounds: Normal breath sounds.  Abdominal:     General: Abdomen is flat. Bowel sounds are normal. There is no distension.     Palpations: Abdomen is soft. There is no hepatomegaly, splenomegaly or mass.     Tenderness: There is no  abdominal tenderness. There is no right CVA tenderness, left CVA tenderness, guarding or rebound.  Musculoskeletal:        General: Normal range of motion.     Cervical back: Full passive range of motion without pain, normal range of motion and neck supple. No tenderness.     Right lower leg: No edema.     Left lower leg: No edema.  Feet:     Left foot:     Toenail Condition: Left toenails are normal.  Lymphadenopathy:     Cervical: No cervical adenopathy.     Upper Body:     Right upper body: No supraclavicular adenopathy.     Left upper body: No supraclavicular adenopathy.  Skin:    General: Skin is warm and dry.     Capillary Refill: Capillary refill takes less than 2 seconds.     Nails: There is no clubbing.  Neurological:     General: No focal deficit present.     Mental Status: She is alert and oriented to person, place, and time.     GCS: GCS eye subscore is 4. GCS verbal subscore is 5. GCS motor subscore is 6.     Sensory: Sensation is intact.     Motor: Motor function is intact.     Coordination: Coordination is intact.     Gait: Gait is intact.     Deep Tendon Reflexes: Reflexes are normal and symmetric.  Psychiatric:        Attention and  Perception: Attention normal.        Mood and Affect: Mood normal.        Speech: Speech normal.        Behavior: Behavior normal. Behavior is cooperative.        Thought Content: Thought content normal.        Cognition and Memory: Cognition and memory normal.        Judgment: Judgment normal.      Results for orders placed or performed in visit on 03/24/23  HM COLONOSCOPY   Collection Time: 03/21/23  1:27 PM  Result Value Ref Range   HM Colonoscopy See Report (in chart) See Report (in chart), Patient Reported       Assessment & Plan:   Problem List Items Addressed This Visit     Hypothyroidism   Historically well controlled on levothyroxine . Concerns with increased fatigue, weakness, and palpitations may indicate  possible dysfunction and need for changes. Will monitor labs.       Relevant Medications   levothyroxine  (SYNTHROID ) 88 MCG tablet   Other Relevant Orders   TSH   Mixed hyperlipidemia   Hyperlipidemia with familial predisposition. Currently on statin therapy. Previous lab results showed high triglycerides and cholesterol. - Continue statin therapy - Order lipid profile to assess current cholesterol and triglyceride levels      Relevant Medications   rosuvastatin  (CRESTOR ) 10 MG tablet   Fatigue due to excessive exertion   Chronic fatigue and physical exhaustion likely due to caregiving responsibilities for husband with multiple strokes. Possible contribution from inadequate sleep and stress. Differential includes thyroid  dysfunction and liver issues. - Order thyroid  function tests - Coordinate with Child psychotherapist and nurse to arrange home health or physical therapy support for husband's care      Acne   Intermittent acne flares around the mouth and nose, treated with minocycline  as needed. - Continue minocycline  as needed for acne flares      Relevant Medications   minocycline  (MINOCIN ) 100 MG capsule   Environmental and seasonal allergies   Allergic rhinitis well-controlled with montelukast . No recent use of inhaler. - Continue montelukast  therapy      Encounter for annual physical exam - Primary   CPE completed today. Review of HM activities and recommendations discussed and provided on AVS. Anticipatory guidance, diet, and exercise recommendations provided. Medications, allergies, and hx reviewed and updated as necessary. Orders placed as listed below.  Plan: - Labs ordered. Will make changes as necessary based on results.  - I will review these results and send recommendations via MyChart or a telephone call.  - F/U with CPE in 1 year or sooner for acute/chronic health needs as directed.        Moderate persistent asthma without complication   No recent exacerbations or  need for albuterol  use. Controlling with montelukast . No changes.       Relevant Medications   montelukast  (SINGULAIR ) 10 MG tablet   B12 deficiency   Repeat labs today in the setting of severe fatigue.       Relevant Orders   Vitamin B12   Caregiver burden   Chronic fatigue and physical exhaustion likely due to caregiving responsibilities for husband with multiple strokes. Possible contribution from inadequate sleep and stress. Differential includes thyroid  dysfunction and liver issues. - Order thyroid  function tests - Coordinate with social worker and nurse to arrange home health or physical therapy support for husband's care      Relevant Orders   AMB  Referral [facility] Care Management   Overweight with body mass index (BMI) of 26 to 26.9 in adult   Weight gain attributed to stress, anxiety, and disrupted eating patterns due to caregiving responsibilities. She is attempting intermittent fasting but finds it challenging to skip breakfast. Current eating pattern includes breakfast and Davina Howlett dinner. - Encourage eating pattern with breakfast and Rosemary Mossbarger dinner if this is successful in weight management without significant fatigue or hunger cravings.  - Reassess weight loss progress in a few months      Vitamin D  deficiency   Relevant Orders   VITAMIN D  25 Hydroxy (Vit-D Deficiency, Fractures)   Other Visit Diagnoses       Chronic right-sided headache         Screening for endocrine, nutritional, metabolic and immunity disorder       Relevant Orders   CBC with Differential/Platelet   CMP14+EGFR   Hemoglobin A1c   Lipid panel         Follow up plan: Return in about 6 months (around 03/10/2024) for Med Management 30.  NEXT PREVENTATIVE PHYSICAL DUE IN 1 YEAR.  PATIENT COUNSELING PROVIDED FOR ALL ADULT PATIENTS: A well balanced diet low in saturated fats, cholesterol, and moderation in carbohydrates.  This can be as simple as monitoring portion sizes and cutting back on sugary  beverages such as soda and juice to start with.    Daily water consumption of at least 64 ounces.  Physical activity at least 180 minutes per week.  If just starting out, start 10 minutes a day and work your way up.   This can be as simple as taking the stairs instead of the elevator and walking 2-3 laps around the office  purposefully every day.   STD protection, partner selection, and regular testing if high risk.  Limited consumption of alcoholic beverages if alcohol is consumed. For men, I recommend no more than 14 alcoholic beverages per week, spread out throughout the week (max 2 per day). Avoid binge drinking or consuming large quantities of alcohol in one setting.  Please let me know if you feel you may need help with reduction or quitting alcohol consumption.   Avoidance of nicotine, if used. Please let me know if you feel you may need help with reduction or quitting nicotine use.   Daily mental health attention. This can be in the form of 5 minute daily meditation, prayer, journaling, yoga, reflection, etc.  Purposeful attention to your emotions and mental state can significantly improve your overall wellbeing  and  Health.  Please know that I am here to help you with all of your health care goals and am happy to work with you to find a solution that works best for you.  The greatest advice I have received with any changes in life are to take it one step at a time, that even means if all you can focus on is the next 60 seconds, then do that and celebrate your victories.  With any changes in life, you will have set backs, and that is OK. The important thing to remember is, if you have a set back, it is not a failure, it is an opportunity to try again! Screening Testing Mammogram Every 1 -2 years based on history and risk factors Starting at age 52 Pap Smear Ages 21-39 every 3 years Ages 28-65 every 5 years with HPV testing More frequent testing may be required based on  results and history Colon Cancer Screening Every 1-10 years  based on test performed, risk factors, and history Starting at age 66 Bone Density Screening Every 2-10 years based on history Starting at age 88 for women Recommendations for men differ based on medication usage, history, and risk factors AAA Screening One time ultrasound Men 31-35 years old who have every smoked Lung Cancer Screening Low Dose Lung CT every 12 months Age 81-80 years with a 30 pack-year smoking history who still smoke or who have quit within the last 15 years   Screening Labs Routine  Labs: Complete Blood Count (CBC), Complete Metabolic Panel (CMP), Cholesterol (Lipid Panel) Every 6-12 months based on history and medications May be recommended more frequently based on current conditions or previous results Hemoglobin A1c Lab Every 3-12 months based on history and previous results Starting at age 76 or earlier with diagnosis of diabetes, high cholesterol, BMI >26, and/or risk factors Frequent monitoring for patients with diabetes to ensure blood sugar control Thyroid  Panel (TSH) Every 6 months based on history, symptoms, and risk factors May be repeated more often if on medication HIV One time testing for all patients 39 and older May be repeated more frequently for patients with increased risk factors or exposure Hepatitis C One time testing for all patients 109 and older May be repeated more frequently for patients with increased risk factors or exposure Gonorrhea, Chlamydia Every 12 months for all sexually active persons 13-24 years Additional monitoring may be recommended for those who are considered high risk or who have symptoms Every 12 months for any woman on birth control, regardless of sexual activity PSA Men 58-37 years old with risk factors Additional screening may be recommended from age 22-69 based on risk factors, symptoms, and history  Vaccine Recommendations Tetanus Booster All  adults every 10 years Flu Vaccine All patients 6 months and older every year COVID Vaccine All patients 12 years and older Initial dosing with booster May recommend additional booster based on age and health history HPV Vaccine 2 doses all patients age 20-26 Dosing may be considered for patients over 26 Shingles Vaccine (Shingrix) 2 doses all adults 55 years and older Pneumonia (Pneumovax 16) All adults 65 years and older May recommend earlier dosing based on health history One year apart from Prevnar 27 Pneumonia (Prevnar 61) All adults 65 years and older Dosed 1 year after Pneumovax 23 Pneumonia (Prevnar 20) One time alternative to the two dosing of 13 and 23 For all adults with initial dose of 23, 20 is recommended 1 year later For all adults with initial dose of 13, 23 is still recommended as second option 1 year later

## 2023-09-08 ENCOUNTER — Encounter: Payer: Self-pay | Admitting: Nurse Practitioner

## 2023-09-08 ENCOUNTER — Ambulatory Visit: Admitting: Nurse Practitioner

## 2023-09-08 VITALS — BP 124/74 | HR 68 | Ht 66.25 in | Wt 164.4 lb

## 2023-09-08 DIAGNOSIS — E663 Overweight: Secondary | ICD-10-CM

## 2023-09-08 DIAGNOSIS — E538 Deficiency of other specified B group vitamins: Secondary | ICD-10-CM

## 2023-09-08 DIAGNOSIS — R519 Headache, unspecified: Secondary | ICD-10-CM

## 2023-09-08 DIAGNOSIS — Z636 Dependent relative needing care at home: Secondary | ICD-10-CM | POA: Insufficient documentation

## 2023-09-08 DIAGNOSIS — E559 Vitamin D deficiency, unspecified: Secondary | ICD-10-CM

## 2023-09-08 DIAGNOSIS — E039 Hypothyroidism, unspecified: Secondary | ICD-10-CM | POA: Diagnosis not present

## 2023-09-08 DIAGNOSIS — T733XXA Exhaustion due to excessive exertion, initial encounter: Secondary | ICD-10-CM | POA: Diagnosis not present

## 2023-09-08 DIAGNOSIS — J3089 Other allergic rhinitis: Secondary | ICD-10-CM

## 2023-09-08 DIAGNOSIS — Z130 Encounter for screening for diseases of the blood and blood-forming organs and certain disorders involving the immune mechanism: Secondary | ICD-10-CM

## 2023-09-08 DIAGNOSIS — E782 Mixed hyperlipidemia: Secondary | ICD-10-CM

## 2023-09-08 DIAGNOSIS — G8929 Other chronic pain: Secondary | ICD-10-CM

## 2023-09-08 DIAGNOSIS — Z0000 Encounter for general adult medical examination without abnormal findings: Secondary | ICD-10-CM

## 2023-09-08 DIAGNOSIS — L700 Acne vulgaris: Secondary | ICD-10-CM

## 2023-09-08 DIAGNOSIS — J4540 Moderate persistent asthma, uncomplicated: Secondary | ICD-10-CM

## 2023-09-08 MED ORDER — ROSUVASTATIN CALCIUM 10 MG PO TABS: 10.0000 mg | ORAL_TABLET | Freq: Every day | ORAL | 3 refills | Status: AC

## 2023-09-08 MED ORDER — MONTELUKAST SODIUM 10 MG PO TABS: 10.0000 mg | ORAL_TABLET | Freq: Every day | ORAL | 3 refills | Status: AC

## 2023-09-08 MED ORDER — MINOCYCLINE HCL 100 MG PO CAPS: 100.0000 mg | ORAL_CAPSULE | Freq: Every day | ORAL | 1 refills | Status: DC

## 2023-09-08 MED ORDER — LEVOTHYROXINE SODIUM 88 MCG PO TABS: 88.0000 ug | ORAL_TABLET | Freq: Every day | ORAL | 3 refills | Status: AC

## 2023-09-08 NOTE — Assessment & Plan Note (Signed)

## 2023-09-08 NOTE — Assessment & Plan Note (Signed)
 No recent exacerbations or need for albuterol  use. Controlling with montelukast . No changes.

## 2023-09-08 NOTE — Assessment & Plan Note (Signed)
 Historically well controlled on 88mcg levothyroxine . Concerns with increased fatigue, weakness, and palpitations may indicate possible dysfunction and need for changes. Will monitor labs.

## 2023-09-08 NOTE — Assessment & Plan Note (Signed)
 Hyperlipidemia with familial predisposition. Currently on statin therapy. Previous lab results showed high triglycerides and cholesterol. - Continue statin therapy - Order lipid profile to assess current cholesterol and triglyceride levels

## 2023-09-08 NOTE — Assessment & Plan Note (Signed)
 Allergic rhinitis well-controlled with montelukast . No recent use of inhaler. - Continue montelukast  therapy

## 2023-09-08 NOTE — Patient Instructions (Addendum)
 I will place an order to see if we can get help for you in the home. I will also speak to Ludie to see if he has any suggestions.    WEIGHT LOSS PLANNING  For best management of weight, it is vital to balance intake versus output. This means the number of calories burned per day must be less than the calories you take in with food and drink.   I recommend trying to follow a diet with the following: Calories: 1200-1500 calories per day Carbohydrates: 150-180 grams of carbohydrates per day  Why: Gives your body enough quick fuel for cells to maintain normal function without sending them into starvation mode.  Protein: At least 90 grams of protein per day- 30 grams with each meal Why: Protein takes longer and uses more energy than carbohydrates to break down for fuel. The carbohydrates in your meals serves as quick energy sources and proteins help use some of that extra quick energy to break down to produce long term energy. This helps you not feel hungry as quickly and protein breakdown burns calories.  Water: Drink AT LEAST 64 ounces of water per day  Why: Water is essential to healthy metabolism. Water helps to fill the stomach and keep you fuller longer. Water is required for healthy digestion and filtering of waste in the body.  Fat: Limit fats in your diet- when choosing fats, choose foods with lower fats content such as lean meats (chicken, fish, [place]).  Why: Increased fat intake leads to storage for later. Once you burn your carbohydrate energy, your body goes into fat and protein breakdown mode to help you loose weight.  Cholesterol: Fats and oils that are LIQUID at room temperature are best. Choose vegetable oils (olive oil, avocado oil, nuts). Avoid fats that are SOLID at room temperature (animal fats, processed meats). Healthy fats are often found in whole grains, beans, nuts, seeds, and berries.  Why: Elevated cholesterol levels lead to build up of cholesterol on the inside of your  blood vessels. This will eventually cause the blood vessels to become hard and can lead to high blood pressure and damage to your organs. When the blood flow is reduced, but the pressure is high from cholesterol buildup, parts of the cholesterol can break off and form clots that can go to the brain or heart leading to a stroke or heart attack.  Fiber: Increase amount of SOLUBLE the fiber in your diet. This helps to fill you up, lowers cholesterol, and helps with digestion. Some foods high in soluble fiber are oats, peas, beans, apples, carrots, barley, and citrus fruits.   Why: Fiber fills you up, helps remove excess cholesterol, and aids in healthy digestion which are all very important in weight management.   I recommend the following as a minimum activity routine: Purposeful walk or other physical activity at least 20 minutes every single day. This means purposefully taking a walk, jog, bike, swim, treadmill, elliptical, dance, etc.  This activity should be ABOVE your normal daily activities, such as walking at work. Goal exercise should be at least 150 minutes a week- work your way up to this.   Heart Rate: Your maximum exercise heart rate should be 220 - Your Age in Years. When exercising, get your heart rate up, but avoid going over the maximum targeted heart rate.  60-70% of your maximum heart rate is where you tend to burn the most fat. To find this number:  8 - Age In Years= Max  HR  Max HR x 0.6 (or 0.7) = Fat Burning HR The Fat Burning HR is your goal heart rate while working out to burn the most fat.  NEVER exercise to the point your feel lightheaded, weak, nauseated, dizzy. If you experience ANY of these symptoms- STOP exercise! Allow yourself to cool down and your heart rate to come down. Then restart slower next time.  If at ANY TIME you feel chest pain or chest pressure during exercise, STOP IMMEDIATELY and seek medical attention.

## 2023-09-08 NOTE — Assessment & Plan Note (Signed)
 Chronic fatigue and physical exhaustion likely due to caregiving responsibilities for husband with multiple strokes. Possible contribution from inadequate sleep and stress. Differential includes thyroid  dysfunction and liver issues. - Order thyroid  function tests - Coordinate with social worker and nurse to arrange home health or physical therapy support for husband's care

## 2023-09-08 NOTE — Assessment & Plan Note (Signed)
 Weight gain attributed to stress, anxiety, and disrupted eating patterns due to caregiving responsibilities. She is attempting intermittent fasting but finds it challenging to skip breakfast. Current eating pattern includes breakfast and Thessaly Mccullers dinner. - Encourage eating pattern with breakfast and Stacey Gardner dinner if this is successful in weight management without significant fatigue or hunger cravings.  - Reassess weight loss progress in a few months

## 2023-09-08 NOTE — Assessment & Plan Note (Signed)
 Intermittent acne flares around the mouth and nose, treated with minocycline  as needed. - Continue minocycline  as needed for acne flares

## 2023-09-08 NOTE — Assessment & Plan Note (Signed)
 Repeat labs today in the setting of severe fatigue.

## 2023-09-09 LAB — CBC WITH DIFFERENTIAL/PLATELET
Basophils Absolute: 0 x10E3/uL (ref 0.0–0.2)
Basos: 1 %
EOS (ABSOLUTE): 0.1 x10E3/uL (ref 0.0–0.4)
Eos: 1 %
Hematocrit: 41.4 % (ref 34.0–46.6)
Hemoglobin: 13.6 g/dL (ref 11.1–15.9)
Immature Grans (Abs): 0 x10E3/uL (ref 0.0–0.1)
Immature Granulocytes: 0 %
Lymphocytes Absolute: 1.8 x10E3/uL (ref 0.7–3.1)
Lymphs: 34 %
MCH: 30.8 pg (ref 26.6–33.0)
MCHC: 32.9 g/dL (ref 31.5–35.7)
MCV: 94 fL (ref 79–97)
Monocytes Absolute: 0.5 x10E3/uL (ref 0.1–0.9)
Monocytes: 9 %
Neutrophils Absolute: 2.9 x10E3/uL (ref 1.4–7.0)
Neutrophils: 55 %
Platelets: 292 x10E3/uL (ref 150–450)
RBC: 4.42 x10E6/uL (ref 3.77–5.28)
RDW: 13.3 % (ref 11.7–15.4)
WBC: 5.3 x10E3/uL (ref 3.4–10.8)

## 2023-09-09 LAB — CMP14+EGFR
ALT: 29 IU/L (ref 0–32)
AST: 29 IU/L (ref 0–40)
Albumin: 4.7 g/dL (ref 3.8–4.9)
Alkaline Phosphatase: 138 IU/L — ABNORMAL HIGH (ref 44–121)
BUN/Creatinine Ratio: 17 (ref 9–23)
BUN: 13 mg/dL (ref 6–24)
Bilirubin Total: 0.3 mg/dL (ref 0.0–1.2)
CO2: 19 mmol/L — ABNORMAL LOW (ref 20–29)
Calcium: 9.8 mg/dL (ref 8.7–10.2)
Chloride: 105 mmol/L (ref 96–106)
Creatinine, Ser: 0.75 mg/dL (ref 0.57–1.00)
Globulin, Total: 2.4 g/dL (ref 1.5–4.5)
Glucose: 87 mg/dL (ref 70–99)
Potassium: 4 mmol/L (ref 3.5–5.2)
Sodium: 141 mmol/L (ref 134–144)
Total Protein: 7.1 g/dL (ref 6.0–8.5)
eGFR: 93 mL/min/1.73 (ref >59)

## 2023-09-09 LAB — VITAMIN D 25 HYDROXY (VIT D DEFICIENCY, FRACTURES): Vit D, 25-Hydroxy: 28.4 ng/mL — ABNORMAL LOW (ref 30.0–100.0)

## 2023-09-09 LAB — HEMOGLOBIN A1C
Est. average glucose Bld gHb Est-mCnc: 103 mg/dL
Hgb A1c MFr Bld: 5.2 % (ref 4.8–5.6)

## 2023-09-09 LAB — TSH: TSH: 1.25 u[IU]/mL (ref 0.450–4.500)

## 2023-09-09 LAB — LIPID PANEL
Chol/HDL Ratio: 3.6 ratio (ref 0.0–4.4)
Cholesterol, Total: 174 mg/dL (ref 100–199)
HDL: 48 mg/dL (ref >39)
LDL Chol Calc (NIH): 88 mg/dL (ref 0–99)
Triglycerides: 224 mg/dL — ABNORMAL HIGH (ref 0–149)
VLDL Cholesterol Cal: 38 mg/dL (ref 5–40)

## 2023-09-09 LAB — VITAMIN B12: Vitamin B-12: 762 pg/mL (ref 232–1245)

## 2023-09-11 ENCOUNTER — Telehealth: Payer: Self-pay

## 2023-09-11 NOTE — Progress Notes (Unsigned)
 Complex Care Management Note Care Guide Note  09/11/2023 Name: Elaura Calix MRN: [id number] DOB: October 28, 1965   Complex Care Management Outreach Attempts: A second unsuccessful outreach was attempted today to offer the patient with information about available complex care management services.  Follow Up Plan:  Additional outreach attempts will be made to offer the patient complex care management information and services.   Encounter Outcome:  No Answer  Leotis Rase [facility], [facility] Guide  Direct Dial: [phone]  Fax [phone]

## 2023-09-11 NOTE — Progress Notes (Unsigned)
 Complex Care Management Note Care Guide Note  09/11/2023 Name: Stacey Gardner MRN: [id number] DOB: 09-30-1965   Complex Care Management Outreach Attempts: An unsuccessful telephone outreach was attempted today to offer the patient information about available complex care management services.  Follow Up Plan:  Additional outreach attempts will be made to offer the patient complex care management information and services.   Encounter Outcome:  No Answer  Leotis Rase [facility], [facility] Guide  Direct Dial: [phone]  Fax [phone]

## 2023-09-16 ENCOUNTER — Other Ambulatory Visit: Payer: Self-pay | Admitting: Licensed Clinical Social Worker

## 2023-09-16 ENCOUNTER — Ambulatory Visit: Payer: Self-pay | Admitting: Nurse Practitioner

## 2023-09-16 DIAGNOSIS — E559 Vitamin D deficiency, unspecified: Secondary | ICD-10-CM

## 2023-09-16 MED ORDER — VITAMIN D (ERGOCALCIFEROL) 1.25 MG (50000 UNIT) PO CAPS
50000.0000 [IU] | ORAL_CAPSULE | ORAL | 3 refills | Status: AC
Start: 2023-09-16 — End: ?

## 2023-09-16 NOTE — Patient Outreach (Signed)
 SW called the patient was unsure why this call was being made. The Patient stated that this was in reference to her husband Stacey Gardner and that the PCP made a referral and that a RNCM is scheduled to call him on 10/16/2023. The patient stated that she wil talk to the [facility] when she calls.   The SW will close this case out due to no needs and will contact the [facility] to let her know the outcome.

## 2023-09-16 NOTE — Patient Instructions (Signed)
 Visit Information  Thank you for taking time to visit with me today. Please don't hesitate to contact me if I can be of assistance to you before our next scheduled appointment.  Our next appointment is no further scheduled appointments.   Please call the care guide team at [phone] if you need to cancel or reschedule your appointment.   Following is a copy of your care plan:   Goals Addressed   None     Please call the Suicide and Crisis Lifeline: 988 go to [facility] Urgent [facility] [address] [place] [phone]) call 911 if you are experiencing a Mental Health or Behavioral Health Crisis or need someone to talk to.  Patient verbalizes understanding of instructions and care plan provided today and agrees to view in MyChart. Active MyChart status and patient understanding of how to access instructions and care plan via MyChart confirmed with patient.     Tobias CHARM Maranda HEDWIG, PhD [facility], [facility] Social Worker Direct Dial: [phone]  Fax: [phone]

## 2023-09-23 NOTE — Telephone Encounter (Signed)
 Reached out to [facility] and told them pt Bard) would like to continue therapy. New referral was sent.

## 2023-12-15 ENCOUNTER — Ambulatory Visit: Payer: Self-pay | Admitting: Internal Medicine

## 2023-12-16 ENCOUNTER — Encounter: Payer: Self-pay | Admitting: Nurse Practitioner

## 2024-03-08 ENCOUNTER — Other Ambulatory Visit: Payer: Self-pay | Admitting: Nurse Practitioner

## 2024-03-08 DIAGNOSIS — L700 Acne vulgaris: Secondary | ICD-10-CM

## 2024-03-11 ENCOUNTER — Encounter: Payer: Self-pay | Admitting: Nurse Practitioner
